# Patient Record
Sex: Female | Born: 1970 | Race: Black or African American | Hispanic: No | Smoking: Never smoker
Health system: Southern US, Community
[De-identification: ages and names within clinical notes are randomized; demographics above are authoritative.]

## PROBLEM LIST (undated history)

## (undated) ENCOUNTER — Emergency Department: Admission: EM | Payer: PRIVATE HEALTH INSURANCE | Source: Home / Self Care

## (undated) DIAGNOSIS — G709 Myoneural disorder, unspecified: Secondary | ICD-10-CM

## (undated) DIAGNOSIS — F419 Anxiety disorder, unspecified: Secondary | ICD-10-CM

## (undated) DIAGNOSIS — G43909 Migraine, unspecified, not intractable, without status migrainosus: Secondary | ICD-10-CM

## (undated) DIAGNOSIS — D649 Anemia, unspecified: Secondary | ICD-10-CM

## (undated) DIAGNOSIS — J439 Emphysema, unspecified: Secondary | ICD-10-CM

## (undated) DIAGNOSIS — T7840XA Allergy, unspecified, initial encounter: Secondary | ICD-10-CM

## (undated) DIAGNOSIS — M1990 Unspecified osteoarthritis, unspecified site: Secondary | ICD-10-CM

## (undated) HISTORY — PX: BREAST SURGERY: SHX581

## (undated) HISTORY — DX: Allergy, unspecified, initial encounter: T78.40XA

## (undated) HISTORY — DX: Anemia, unspecified: D64.9

## (undated) HISTORY — DX: Myoneural disorder, unspecified: G70.9

## (undated) HISTORY — DX: Unspecified osteoarthritis, unspecified site: M19.90

## (undated) HISTORY — DX: Emphysema, unspecified: J43.9

---
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
## 2001-02-22 HISTORY — PX: CYSTOSCOPY: SUR368

## 2005-02-22 HISTORY — PX: LIPOMA EXCISION: SHX5283

## 2011-06-01 DIAGNOSIS — R209 Unspecified disturbances of skin sensation: Secondary | ICD-10-CM | POA: Insufficient documentation

## 2011-06-01 DIAGNOSIS — R42 Dizziness and giddiness: Secondary | ICD-10-CM | POA: Insufficient documentation

## 2012-03-28 DIAGNOSIS — F411 Generalized anxiety disorder: Secondary | ICD-10-CM | POA: Insufficient documentation

## 2012-09-27 DIAGNOSIS — G5700 Lesion of sciatic nerve, unspecified lower limb: Secondary | ICD-10-CM | POA: Insufficient documentation

## 2012-11-29 DIAGNOSIS — H6980 Other specified disorders of Eustachian tube, unspecified ear: Secondary | ICD-10-CM | POA: Insufficient documentation

## 2013-04-04 DIAGNOSIS — K219 Gastro-esophageal reflux disease without esophagitis: Secondary | ICD-10-CM | POA: Insufficient documentation

## 2013-04-27 DIAGNOSIS — K581 Irritable bowel syndrome with constipation: Secondary | ICD-10-CM | POA: Insufficient documentation

## 2013-05-31 DIAGNOSIS — M7630 Iliotibial band syndrome, unspecified leg: Secondary | ICD-10-CM | POA: Insufficient documentation

## 2013-06-23 DIAGNOSIS — Z8639 Personal history of other endocrine, nutritional and metabolic disease: Secondary | ICD-10-CM | POA: Insufficient documentation

## 2013-08-09 DIAGNOSIS — M545 Low back pain, unspecified: Secondary | ICD-10-CM | POA: Insufficient documentation

## 2013-08-09 DIAGNOSIS — M7071 Other bursitis of hip, right hip: Secondary | ICD-10-CM | POA: Insufficient documentation

## 2013-08-09 DIAGNOSIS — M7070 Other bursitis of hip, unspecified hip: Secondary | ICD-10-CM | POA: Insufficient documentation

## 2013-08-10 DIAGNOSIS — N393 Stress incontinence (female) (male): Secondary | ICD-10-CM | POA: Insufficient documentation

## 2013-11-15 DIAGNOSIS — M25869 Other specified joint disorders, unspecified knee: Secondary | ICD-10-CM | POA: Insufficient documentation

## 2013-12-12 DIAGNOSIS — R519 Headache, unspecified: Secondary | ICD-10-CM | POA: Insufficient documentation

## 2014-10-10 DIAGNOSIS — G8929 Other chronic pain: Secondary | ICD-10-CM | POA: Insufficient documentation

## 2015-09-24 DIAGNOSIS — M546 Pain in thoracic spine: Secondary | ICD-10-CM | POA: Insufficient documentation

## 2016-07-14 DIAGNOSIS — F419 Anxiety disorder, unspecified: Secondary | ICD-10-CM | POA: Insufficient documentation

## 2018-06-08 DIAGNOSIS — R768 Other specified abnormal immunological findings in serum: Secondary | ICD-10-CM | POA: Insufficient documentation

## 2018-06-08 DIAGNOSIS — M25549 Pain in joints of unspecified hand: Secondary | ICD-10-CM | POA: Insufficient documentation

## 2018-06-08 DIAGNOSIS — M4802 Spinal stenosis, cervical region: Secondary | ICD-10-CM | POA: Insufficient documentation

## 2019-02-21 DIAGNOSIS — H6593 Unspecified nonsuppurative otitis media, bilateral: Secondary | ICD-10-CM | POA: Insufficient documentation

## 2019-02-21 DIAGNOSIS — N946 Dysmenorrhea, unspecified: Secondary | ICD-10-CM | POA: Insufficient documentation

## 2019-04-12 ENCOUNTER — Ambulatory Visit
Admission: EM | Admit: 2019-04-12 | Discharge: 2019-04-12 | Disposition: A | Discharge status: Home / Self Care | Payer: Managed Care, Other (non HMO) | Attending: Family Medicine | Admitting: Family Medicine

## 2019-04-12 ENCOUNTER — Encounter: Payer: Self-pay | Admitting: Emergency Medicine

## 2019-04-12 ENCOUNTER — Other Ambulatory Visit: Payer: Self-pay

## 2019-04-12 DIAGNOSIS — Z20822 Contact with and (suspected) exposure to covid-19: Secondary | ICD-10-CM

## 2019-04-12 LAB — SARS CORONAVIRUS 2 AG (30 MIN TAT): SARS Coronavirus 2 Ag: NEGATIVE

## 2019-04-12 NOTE — Discharge Instructions (Signed)
Rest.  Fluids.  Tylenol and ibuprofen as needed.  Take care  Dr. Maikayla Beggs  

## 2019-04-12 NOTE — ED Triage Notes (Signed)
Patient states she was exposed to COVID last week by 5 people. She states she has had a low grade fever, body aches, headache and fatigue that started Monday.

## 2019-04-12 NOTE — ED Provider Notes (Signed)
[facility]    CSN: [id number] Arrival date & time: 04/12/19  1755  History   Chief Complaint Chief Complaint  Patient presents with  . Generalized Body Aches  . Fatigue   HPI  49 year old female presents with the above complaints.  Patient reports recent exposure to an individual who tested positive for Covid.  She reports low-grade fever, body aches, headache, fatigue, chills, body aches.  Rates her pain as 5/10 in severity.  No relieving factors.  Her son is also sick with similar symptoms.  No other reported symptoms.  No other complaints or concerns at this time.  Home Medications    Prior to Admission medications   Medication Sig Start Date End Date Taking? Authorizing Provider  amoxicillin (AMOXIL) 500 MG capsule Take 500 mg by mouth 3 (three) times daily.   Yes [provider]  ergocalciferol (VITAMIN D2) 1.25 MG (50000 UT) capsule Take by mouth. 02/22/19 02/22/20 Yes [provider]   Social History Social History   Tobacco Use  . Smoking status: Never Smoker  . Smokeless tobacco: Never Used  Substance Use Topics  . Alcohol use: Never  . Drug use: Never     Allergies   Hydrocodone   Review of Systems Review of Systems Per HPI  Physical Exam Triage Vital Signs ED Triage Vitals  Enc Vitals Group     BP 04/12/19 1814 126/80     Pulse Rate 04/12/19 1814 78     Resp 04/12/19 1814 18     Temp 04/12/19 1814 99.8 F (37.7 C)     Temp Source 04/12/19 1814 Oral     SpO2 04/12/19 1814 98 %     Weight 04/12/19 1810 141 lb (64 kg)     Height 04/12/19 1810 5\' 4"  (1.626 m)     Head Circumference --      Peak Flow --      Pain Score 04/12/19 1809 5     Pain Loc --      Pain Edu? --      Excl. in [place]? --    Updated Vital Signs BP 126/80 (BP Location: Right Arm)   Pulse 78   Temp 99.8 F (37.7 C) (Oral)   Resp 18   Ht 5\' 4"  (1.626 m)   Wt 64 kg   LMP 03/29/2019   SpO2 98%   BMI 24.20 kg/m   Visual Acuity Right Eye  Distance:   Left Eye Distance:   Bilateral Distance:    Right Eye Near:   Left Eye Near:    Bilateral Near:     Physical Exam Vitals and nursing note reviewed.  Constitutional:      General: She is not in acute distress.    Appearance: Normal appearance. She is not ill-appearing.  HENT:     Head: Normocephalic and atraumatic.     Mouth/Throat:     Pharynx: Oropharynx is clear. No oropharyngeal exudate.  Eyes:     General:        Right eye: No discharge.        Left eye: No discharge.     Conjunctiva/sclera: Conjunctivae normal.  Cardiovascular:     Rate and Rhythm: Normal rate and regular rhythm.     Heart sounds: No murmur.  Pulmonary:     Effort: Pulmonary effort is normal.     Breath sounds: Normal breath sounds. No wheezing, rhonchi or rales.  Neurological:     Mental Status: She is alert.  Psychiatric:        Mood and Affect: Mood normal.        Behavior: Behavior normal.    UC Treatments / Results  Labs (all labs ordered are listed, but only abnormal results are displayed) Labs Reviewed  SARS CORONAVIRUS 2 AG (30 MIN TAT)  NOVEL CORONAVIRUS, NAA (HOSP ORDER, SEND-OUT TO REF LAB; TAT 18-24 HRS)    EKG   Radiology No results found.  Procedures Procedures (including critical care time)  Medications Ordered in UC Medications - No data to display  Initial Impression / Assessment and Plan / UC Course  I have reviewed the triage vital signs and the nursing notes.  Pertinent labs & imaging results that were available during my care of the patient were reviewed by me and considered in my medical decision making (see chart for details).    49 year old female presents with suspected COVID-19.  Rapid test negative.  Awaiting PCR test results.  Advised rest, fluids, Tylenol and ibuprofen as needed.  Supportive care.  Work note given.  Final Clinical Impressions(s) / UC Diagnoses   Final diagnoses:  Suspected COVID-19 virus infection     Discharge  Instructions     Rest.  Fluids.  Tylenol and ibuprofen as needed.  Take care  Dr. Adriana Simas    ED Prescriptions    None     PDMP not reviewed this encounter.   Tommie Sams, [place] 04/12/19 [phone]

## 2019-04-14 LAB — NOVEL CORONAVIRUS, NAA (HOSP ORDER, SEND-OUT TO REF LAB; TAT 18-24 HRS): SARS-CoV-2, NAA: NOT DETECTED

## 2019-05-28 ENCOUNTER — Other Ambulatory Visit: Payer: Self-pay

## 2019-05-28 ENCOUNTER — Ambulatory Visit
Admission: EM | Admit: 2019-05-28 | Discharge: 2019-05-28 | Discharge status: Against Medical Advice | Payer: Managed Care, Other (non HMO) | Attending: Family Medicine | Admitting: Family Medicine

## 2019-06-07 DIAGNOSIS — J302 Other seasonal allergic rhinitis: Secondary | ICD-10-CM | POA: Insufficient documentation

## 2019-06-11 DIAGNOSIS — G4710 Hypersomnia, unspecified: Secondary | ICD-10-CM | POA: Insufficient documentation

## 2019-06-14 ENCOUNTER — Other Ambulatory Visit: Payer: Self-pay | Admitting: Neurology

## 2019-06-14 DIAGNOSIS — R404 Transient alteration of awareness: Secondary | ICD-10-CM

## 2019-06-14 DIAGNOSIS — R0683 Snoring: Secondary | ICD-10-CM

## 2020-02-15 ENCOUNTER — Emergency Department (HOSPITAL_COMMUNITY): Payer: Medicaid Other

## 2020-02-15 ENCOUNTER — Other Ambulatory Visit: Payer: Self-pay

## 2020-02-15 ENCOUNTER — Encounter (HOSPITAL_COMMUNITY): Payer: Self-pay | Admitting: Physician Assistant

## 2020-02-15 ENCOUNTER — Emergency Department (HOSPITAL_COMMUNITY)
Admission: EM | Admit: 2020-02-15 | Discharge: 2020-02-15 | Disposition: A | Discharge status: Home / Self Care | Payer: Medicaid Other | Attending: Emergency Medicine | Admitting: Emergency Medicine

## 2020-02-15 DIAGNOSIS — R55 Syncope and collapse: Secondary | ICD-10-CM | POA: Insufficient documentation

## 2020-02-15 LAB — CBC WITH DIFFERENTIAL/PLATELET
Abs Immature Granulocytes: 0.01 10*3/uL (ref 0.00–0.07)
Basophils Absolute: 0.1 10*3/uL (ref 0.0–0.1)
Basophils Relative: 1 %
Eosinophils Absolute: 0.4 10*3/uL (ref 0.0–0.5)
Eosinophils Relative: 7 %
HCT: 34 % — ABNORMAL LOW (ref 36.0–46.0)
Hemoglobin: 10.6 g/dL — ABNORMAL LOW (ref 12.0–15.0)
Immature Granulocytes: 0 %
Lymphocytes Relative: 29 %
Lymphs Abs: 1.9 10*3/uL (ref 0.7–4.0)
MCH: 25 pg — ABNORMAL LOW (ref 26.0–34.0)
MCHC: 31.2 g/dL (ref 30.0–36.0)
MCV: 80.2 fL (ref 80.0–100.0)
Monocytes Absolute: 0.6 10*3/uL (ref 0.1–1.0)
Monocytes Relative: 10 %
Neutro Abs: 3.3 10*3/uL (ref 1.7–7.7)
Neutrophils Relative %: 53 %
Platelets: 314 10*3/uL (ref 150–400)
RBC: 4.24 MIL/uL (ref 3.87–5.11)
RDW: 17.1 % — ABNORMAL HIGH (ref 11.5–15.5)
WBC: 6.3 10*3/uL (ref 4.0–10.5)
nRBC: 0 % (ref 0.0–0.2)

## 2020-02-15 LAB — COMPREHENSIVE METABOLIC PANEL
ALT: 12 U/L (ref 0–44)
AST: 26 U/L (ref 15–41)
Albumin: 3.7 g/dL (ref 3.5–5.0)
Alkaline Phosphatase: 54 U/L (ref 38–126)
Anion gap: 9 (ref 5–15)
BUN: 8 mg/dL (ref 6–20)
CO2: 25 mmol/L (ref 22–32)
Calcium: 9.1 mg/dL (ref 8.9–10.3)
Chloride: 103 mmol/L (ref 98–111)
Creatinine, Ser: 0.63 mg/dL (ref 0.44–1.00)
GFR, Estimated: >60 mL/min (ref >60)
Glucose, Bld: 88 mg/dL (ref 70–99)
Potassium: 3.7 mmol/L (ref 3.5–5.1)
Sodium: 137 mmol/L (ref 135–145)
Total Bilirubin: 0.7 mg/dL (ref 0.3–1.2)
Total Protein: 6.6 g/dL (ref 6.5–8.1)

## 2020-02-15 LAB — I-STAT BETA HCG BLOOD, ED (MC, WL, AP ONLY): I-stat hCG, quantitative: <5 m[IU]/mL (ref <5)

## 2020-02-15 LAB — LIPASE, BLOOD: Lipase: 33 U/L (ref 11–51)

## 2020-02-15 LAB — TROPONIN I (HIGH SENSITIVITY): Troponin I (High Sensitivity): 6 ng/L (ref <18)

## 2020-02-15 MED ORDER — LACTATED RINGERS IV BOLUS
1000.0000 mL | Freq: Once | INTRAVENOUS | Status: AC
  Administered 2020-02-15: 1000 mL via INTRAVENOUS

## 2020-02-15 NOTE — ED Provider Notes (Signed)
[facility] EMERGENCY DEPARTMENT Provider Note   CSN: [id number] Arrival date & time: 02/15/20  1931     History Chief Complaint  Patient presents with   Near Syncope    Eladia Shimkus is a 49 y.o. female who presents today for evaluation of multiple complaints. She is being followed by cardiology at [facility] and neurology at [facility] for about a year of multiple symptoms.  She frequently has episodes of chest pain. She states that she had an episode today that is consistent with her usual episodes.  She states that she feels like her eyes get inflamed to the point that she can feel air moving by them and knows that shortly after that she is going to feel very fatigued.  She had that feeling today along with feeling like air is rushing by the left side of her head.  She then felt like the signals from her brain were not getting to her muscles.  She denies specific weakness, stating that when her son pulled her forward she was able to walk however it was very slow like moving through molasses and she felt very fatigued.  She reports she felt like she may pass out during this however did not.  She reports that she has had occasional cough recently.  She had left-sided chest pain today for about 3 hours.  She has had chest pain intermittently over the past few months along with the syncopal events.  She does report that she had back and neck pain bilaterally starting a few days ago.  She denies any fevers.  She also reports multiple chronic problems including continued pain in her upper abdomen that started after she had an endoscopy.    She states that if she was closer to home she would have simply gone home and laid down tonight.  She reports that she has only had about 8 ounces of water today.  On chart review it appears that when she was previously seen in the emergency room for the symptoms she was also felt to be dehydrated at that point and her cardiologist had recommended  her taking in 64 ounces of water a day.    HPI     History reviewed. No pertinent past medical history.  There are no problems to display for this patient.   History reviewed. No pertinent surgical history.   OB History   No obstetric history on file.     History reviewed. No pertinent family history.  Social History   Tobacco Use   Smoking status: Never Smoker   Smokeless tobacco: Never Used  Substance Use Topics   Alcohol use: Never   Drug use: Never    Home Medications Prior to Admission medications   Medication Sig Start Date End Date Taking? Authorizing Provider  cyanocobalamin 1000 MCG tablet Take 1,000 mcg by mouth daily.   Yes [provider]  ergocalciferol (VITAMIN D2) 1.25 MG (50000 UT) capsule Take 50,000 Units by mouth once a week. 02/22/19 02/22/20 Yes [provider]  LORazepam (ATIVAN) 1 MG tablet Take 1 mg by mouth every 6 (six) hours as needed for anxiety. 05/23/19  Yes [provider]  Prenatal Vit-Fe Fumarate-FA (PRENATAL MULTIVITAMIN) TABS tablet Take 1 tablet by mouth daily.   Yes [provider]    Allergies    Contrast media [iodinated diagnostic agents], Hydrocodone, and Propoxyphene  Review of Systems   Review of Systems  Constitutional: Negative for chills and fever.  HENT: Positive  for congestion.   Eyes: Negative for visual disturbance.  Respiratory: Positive for chest tightness and shortness of breath.   Cardiovascular: Positive for chest pain.  Gastrointestinal: Positive for abdominal pain. Negative for diarrhea, nausea and vomiting.  Musculoskeletal: Positive for back pain and myalgias.  Skin: Positive for color change.  Neurological: Positive for headaches. Negative for speech difficulty and numbness.  All other systems reviewed and are negative.   Physical Exam Updated Vital Signs BP 131/79 (BP Location: Right Arm)    Pulse 77    Temp 99 F (37.2 C) (Oral)    Resp 18    Ht 5\' 4"  (1.626  m)    Wt 63.1 kg    SpO2 100%    BMI 23.88 kg/m   Physical Exam Vitals and nursing note reviewed.  Constitutional:      General: She is not in acute distress.    Appearance: She is not diaphoretic.  HENT:     Head: Normocephalic and atraumatic.  Eyes:     General: No scleral icterus.       Right eye: No discharge.        Left eye: No discharge.     Conjunctiva/sclera: Conjunctivae normal.  Cardiovascular:     Rate and Rhythm: Normal rate and regular rhythm.     Heart sounds: Normal heart sounds.  Pulmonary:     Effort: Pulmonary effort is normal. No respiratory distress.     Breath sounds: No stridor.  Abdominal:     General: There is no distension.  Musculoskeletal:        General: No deformity.     Cervical back: Normal range of motion and neck supple.     Comments: Diffuse TTP across bilateral trapezius muscles which feel tight.  Palpation recreates and exacerbates her pain.   Skin:    General: Skin is warm and dry.  Neurological:     General: No focal deficit present.     Mental Status: She is alert.     Cranial Nerves: No cranial nerve deficit.     Motor: No abnormal muscle tone.  Psychiatric:        Mood and Affect: Mood normal.     ED Results / Procedures / Treatments   Labs (all labs ordered are listed, but only abnormal results are displayed) Labs Reviewed  CBC WITH DIFFERENTIAL/PLATELET - Abnormal; Notable for the following components:      Result Value   Hemoglobin 10.6 (*)    HCT 34.0 (*)    MCH 25.0 (*)    RDW 17.1 (*)    All other components within normal limits  COMPREHENSIVE METABOLIC PANEL  LIPASE, BLOOD  I-STAT BETA HCG BLOOD, ED ([facility], [facility], [facility] ONLY)  TROPONIN I (HIGH SENSITIVITY)  TROPONIN I (HIGH SENSITIVITY)    EKG EKG Interpretation  Date/Time:  Friday February 15 2020 19:45:21 EST Ventricular Rate:  84 PR Interval:    QRS Duration: 88 QT Interval:  361 QTC Calculation: 427 R Axis:   77 Text Interpretation: Sinus rhythm  Anteroseptal infarct, age indeterminate Confirmed by 04[birth year]19[birth year]1992 [phone]) on 02/15/2020 7:47:43 PM   Radiology DG Chest 2 View  Result Date: 02/15/2020 CLINICAL DATA:  Intermittent chest pain with near syncope. EXAM: CHEST - 2 VIEW COMPARISON:  None. FINDINGS: The heart size and mediastinal contours are within normal limits. Both lungs are clear. The visualized skeletal structures are unremarkable. IMPRESSION: No active cardiopulmonary disease. Electronically Signed   By: 02/17/2020.D.  On: 02/15/2020 20:29    Procedures Procedures (including critical care time)  Medications Ordered in ED Medications  lactated ringers bolus 1,000 mL (0 mLs Intravenous Stopped 02/15/20 2200)    ED Course  I have reviewed the triage vital signs and the nursing notes.  Pertinent labs & imaging results that were available during my care of the patient were reviewed by me and considered in my medical decision making (see chart for details).    MDM Rules/Calculators/A&P                         Patient is a 49 year old woman who presents today for evaluation of near syncopal event.  She has had multiple similar events and this has been an ongoing issue for approximately a year.  She is seen by cardiology and neurology without a clear unifying diagnosis obtained. On exam she is in no obvious distress.  She is afebrile, not tachycardic or tachypneic.  Chest x-ray obtained without acute abnormality.  She is not significantly orthostatic.  Troponin is not elevated.  EKG without acute abnormalities.  Labs obtained and reviewed, she is mildly anemic with a hemoglobin at 10.0.  CMP, lipase, hCG and troponin are all unremarkable. She does report that she has had poor water intake today only having about 6 to 8 ounces of water all day.  On review of notes from her specialist it appears that in the past when she has been seen in the ER for similar episodes there was felt to be a dehydration component.  She is  given 1 L of IV fluids.  Patient requested that her cardiac MRI that her cardiologist had ordered be performed in the emergency room.  I had a lengthy discussion with patient that this is not a standard emergency evaluation, and that I recommended she get the test as an outpatient as her cardiologist intended.  Given that the symptoms have been intermittently ongoing for approximately a year lower suspicion for a acute emergent cause of her symptoms.  General conservative care as discussed.  Note: Portions of this report may have been transcribed using voice recognition software. Every effort was made to ensure accuracy; however, inadvertent computerized transcription errors may be present.   Final Clinical Impression(s) / ED Diagnoses Final diagnoses:  Near syncope    Rx / DC Orders ED Discharge Orders    None       Norman Clay 02/15/20 2211    Sabas Sous, MD 02/15/20 2314

## 2020-02-15 NOTE — ED Notes (Signed)
ED Provider at bedside. 

## 2020-02-15 NOTE — ED Notes (Signed)
Patient transported to X-ray 

## 2020-02-15 NOTE — Discharge Instructions (Addendum)
Please make sure you are drinking plenty of water. It appears that your specialist has recommend that you consume 64 ounces of water a day. I would recommend getting a large water bottle that you can keep with you with the number ounces clearly marked to ensure that you are drinking enough water.  Please schedule follow-up appointment with your specialist.  If your symptoms worsen or you have any additional concerns please seek additional medical care and evaluation.

## 2020-02-15 NOTE — ED Triage Notes (Signed)
Brought in by [facility], pt been having past couple of months intermittent chest pain, near syncopal episodes.  Back and neck pain x few days ago. Pt got out of car and felt like she was gonna black out and SOB.    Pt sees a Psychiatric nurse. Pending MRI of heart.

## 2020-03-06 DIAGNOSIS — R002 Palpitations: Secondary | ICD-10-CM | POA: Insufficient documentation

## 2020-05-02 DIAGNOSIS — D509 Iron deficiency anemia, unspecified: Secondary | ICD-10-CM | POA: Insufficient documentation

## 2020-06-19 ENCOUNTER — Ambulatory Visit (INDEPENDENT_AMBULATORY_CARE_PROVIDER_SITE_OTHER): Payer: Self-pay

## 2020-06-19 ENCOUNTER — Other Ambulatory Visit: Payer: Self-pay

## 2020-06-19 ENCOUNTER — Ambulatory Visit
Admission: EM | Admit: 2020-06-19 | Discharge: 2020-06-19 | Disposition: A | Discharge status: Home / Self Care | Payer: Self-pay | Attending: Emergency Medicine | Admitting: Emergency Medicine

## 2020-06-19 ENCOUNTER — Encounter: Payer: Self-pay | Admitting: Emergency Medicine

## 2020-06-19 DIAGNOSIS — R110 Nausea: Secondary | ICD-10-CM | POA: Insufficient documentation

## 2020-06-19 DIAGNOSIS — J029 Acute pharyngitis, unspecified: Secondary | ICD-10-CM | POA: Insufficient documentation

## 2020-06-19 DIAGNOSIS — H9209 Otalgia, unspecified ear: Secondary | ICD-10-CM | POA: Insufficient documentation

## 2020-06-19 DIAGNOSIS — K5900 Constipation, unspecified: Secondary | ICD-10-CM

## 2020-06-19 DIAGNOSIS — R059 Cough, unspecified: Secondary | ICD-10-CM | POA: Insufficient documentation

## 2020-06-19 DIAGNOSIS — Z20822 Contact with and (suspected) exposure to covid-19: Secondary | ICD-10-CM | POA: Insufficient documentation

## 2020-06-19 DIAGNOSIS — R0602 Shortness of breath: Secondary | ICD-10-CM | POA: Insufficient documentation

## 2020-06-19 DIAGNOSIS — R109 Unspecified abdominal pain: Secondary | ICD-10-CM

## 2020-06-19 DIAGNOSIS — J069 Acute upper respiratory infection, unspecified: Secondary | ICD-10-CM

## 2020-06-19 LAB — RAPID INFLUENZA A&B ANTIGENS
Influenza A (ARMC): NEGATIVE
Influenza B (ARMC): NEGATIVE

## 2020-06-19 MED ORDER — PROMETHAZINE-DM 6.25-15 MG/5ML PO SYRP: 5.0000 mL | ORAL_SOLUTION | Freq: Four times a day (QID) | ORAL | 0 refills | Status: DC | PRN

## 2020-06-19 MED ORDER — IPRATROPIUM BROMIDE 0.06 % NA SOLN: 2.0000 | Freq: Four times a day (QID) | NASAL | 12 refills | Status: DC

## 2020-06-19 MED ORDER — BENZONATATE 100 MG PO CAPS: 200.0000 mg | ORAL_CAPSULE | Freq: Three times a day (TID) | ORAL | 0 refills | Status: DC

## 2020-06-19 NOTE — ED Provider Notes (Signed)
[facility]    CSN: [id number] Arrival date & time: 06/19/20  1134      History   Chief Complaint Chief Complaint  Patient presents with  . Shortness of Breath  . Abdominal Pain  . Sore Throat  . Headache    HPI Robin Schwartz is a 50 y.o. female.   HPI   50 year old female here for evaluation of multiple complaints.  Patient reports that she has been experiencing runny nose with a clear nasal discharge, cough, chest congestion productive for clear sputum, shortness of breath, headache, chills, sinus pain and pressure, sore throat, ear pain, nausea, and constipation for the last 2 days.  Patient reports that her last BM was this morning and it was hard balls of stool.  She denies fever at home, decreased hearing, wheezing, or diarrhea.  Patient does have a mildly elevated temp in clinic of 99.1  History reviewed. No pertinent past medical history.  There are no problems to display for this patient.   History reviewed. No pertinent surgical history.  OB History   No obstetric history on file.      Home Medications    Prior to Admission medications   Medication Sig Start Date End Date Taking? Authorizing Provider  benzonatate (TESSALON) 100 MG capsule Take 2 capsules (200 mg total) by mouth every 8 (eight) hours. 06/19/20  Yes Becky Augusta, NP  ipratropium (ATROVENT) 0.06 % nasal spray Place 2 sprays into both nostrils 4 (four) times daily. 06/19/20  Yes Becky Augusta, NP  promethazine-dextromethorphan (PROMETHAZINE-DM) 6.25-15 MG/5ML syrup Take 5 mLs by mouth 4 (four) times daily as needed. 06/19/20  Yes Becky Augusta, NP  cyanocobalamin 1000 MCG tablet Take 1,000 mcg by mouth daily.    [provider]  LORazepam (ATIVAN) 1 MG tablet Take 1 mg by mouth every 6 (six) hours as needed for anxiety. 05/23/19   [provider]    Family History History reviewed. No pertinent family history.  Social History Social History   Tobacco Use  .  Smoking status: Never Smoker  . Smokeless tobacco: Never Used  Substance Use Topics  . Alcohol use: Never  . Drug use: Never     Allergies   Contrast media [iodinated diagnostic agents], Hydrocodone, and Propoxyphene   Review of Systems Review of Systems  Constitutional: Positive for chills. Negative for activity change, appetite change and fever.  HENT: Positive for congestion, ear pain, rhinorrhea, sinus pressure and sore throat. Negative for hearing loss.   Respiratory: Positive for cough and shortness of breath. Negative for wheezing.   Gastrointestinal: Positive for abdominal pain, constipation and nausea. Negative for diarrhea and vomiting.  Skin: Negative for rash.  Neurological: Positive for headaches.  Hematological: Negative.   Psychiatric/Behavioral: Negative.      Physical Exam Triage Vital Signs ED Triage Vitals [06/19/20 1307]  Enc Vitals Group     BP (!) 116/91     Pulse Rate 84     Resp 18     Temp 99.1 F (37.3 C)     Temp Source Oral     SpO2 100 %     Weight      Height      Head Circumference      Peak Flow      Pain Score      Pain Loc      Pain Edu?      Excl. in [place]?    No data found.  Updated Vital Signs BP Marland Kitchen)  116/91 (BP Location: Left Arm)   Pulse 84   Temp 99.1 F (37.3 C) (Oral)   Resp 18   LMP 06/16/2020   SpO2 100%   Visual Acuity Right Eye Distance:   Left Eye Distance:   Bilateral Distance:    Right Eye Near:   Left Eye Near:    Bilateral Near:     Physical Exam Vitals and nursing note reviewed.  Constitutional:      General: She is not in acute distress.    Appearance: Normal appearance. She is normal weight. She is not ill-appearing.  HENT:     Head: Normocephalic and atraumatic.     Right Ear: Tympanic membrane, ear canal and external ear normal. There is no impacted cerumen.     Left Ear: Tympanic membrane and external ear normal. There is no impacted cerumen.     Nose: Congestion and rhinorrhea present.      Mouth/Throat:     Mouth: Mucous membranes are moist.     Pharynx: Oropharynx is clear. Posterior oropharyngeal erythema present. No oropharyngeal exudate.  Cardiovascular:     Rate and Rhythm: Normal rate and regular rhythm.     Pulses: Normal pulses.     Heart sounds: Normal heart sounds. No murmur heard.   Pulmonary:     Effort: Pulmonary effort is normal.     Breath sounds: Normal breath sounds. No wheezing, rhonchi or rales.  Abdominal:     General: Abdomen is flat.     Palpations: Abdomen is soft.     Tenderness: There is abdominal tenderness. There is no guarding or rebound.  Musculoskeletal:     Cervical back: Normal range of motion and neck supple.  Lymphadenopathy:     Cervical: No cervical adenopathy.  Skin:    General: Skin is warm and dry.     Capillary Refill: Capillary refill takes less than 2 seconds.     Findings: No erythema or rash.  Neurological:     General: No focal deficit present.     Mental Status: She is alert and oriented to person, place, and time.  Psychiatric:        Mood and Affect: Mood normal.        Behavior: Behavior normal.        Thought Content: Thought content normal.        Judgment: Judgment normal.      UC Treatments / Results  Labs (all labs ordered are listed, but only abnormal results are displayed) Labs Reviewed  RAPID INFLUENZA A&B ANTIGENS  SARS CORONAVIRUS 2 (TAT 6-24 HRS)    EKG   Radiology No results found.  Procedures Procedures (including critical care time)  Medications Ordered in UC Medications - No data to display  Initial Impression / Assessment and Plan / UC Course  I have reviewed the triage vital signs and the nursing notes.  Pertinent labs & imaging results that were available during my care of the patient were reviewed by me and considered in my medical decision making (see chart for details).   Patient is a very pleasant, nontoxic-appearing 50 year old female here for evaluation of upper and  lower respiratory complaints and abdominal pain that been going for the past couple of days.  Patient reports that she has been experiencing cough, nasal discharge, chest congestion, shortness of breath, upper abdominal pain, headache, sinus pain or pressure, scratchy throat, productive cough for clear sputum, nausea, constipation, ear pain and chills.  Patient's physical exam reveals bilateral pearly  gray tympanic membranes with normal light reflex and clear external auditory canals.  Nasal mucosa is erythematous and edematous with clear nasal discharge.  Posterior oropharynx has clear postnasal drip and mild posterior erythema.  No tonsillar involvement noted.  Patient does not have any cervical lymphadenopathy on exam.  Lungs are clear auscultation all fields.  Abdomen is soft, nondistended, with diffuse tenderness.  Bowel sounds are within normal limits.  Will swab patient for flu, COVID, and obtain a KUB to evaluate for possible constipation.  KUB reviewed and independently evaluated by me.  Interpretation: There is a moderate stool burden throughout the colon.  There is also number of phleboliths present in the left pelvis.  Awaiting radiology overread.  Once a swab is negative for insulins A or B.  Will discharge patient home to isolate pending the results of her COVID test.  Will provide ipratropium nasal spray to help her with her nasal congestion, Tessalon Perles and Promethazine DM as needed for cough, and have her use MiraLAX for her constipation.   Final Clinical Impressions(s) / UC Diagnoses   Final diagnoses:  Constipation, unspecified constipation type  Viral URI with cough     Discharge Instructions     Testing did not reveal the presence of influenza today but your COVID swab is still pending.  Isolate at home pending the results of your COVID test.  If you test positive then you will have to quarantine for 5 days from the start of your symptoms.  After 5 days you can break  quarantine if your symptoms have improved and you have not had a fever for 24 hours without taking Tylenol or ibuprofen.  Use over-the-counter Tylenol and ibuprofen as needed for body aches and fever.  Use the Tessalon Perles during the day as needed for cough and the Promethazine DM cough syrup at nighttime as will make you drowsy.  Use the ipratropium nasal spray, 2 squirts up each nostril 4 times a day as needed for postnasal drip and runny nose.  If you develop any increased shortness of breath-especially at rest, you are unable to speak in full sentences, or is a late sign your lips are turning blue you need to go the ER for evaluation.   For your constipation take over-the-counter MiraLAX, 1 capful in 8 ounces of beverage of your choice daily until you achieve regular bowel movements.    ED Prescriptions    Medication Sig Dispense Auth. Provider   ipratropium (ATROVENT) 0.06 % nasal spray Place 2 sprays into both nostrils 4 (four) times daily. 15 mL Becky Augusta, NP   benzonatate (TESSALON) 100 MG capsule Take 2 capsules (200 mg total) by mouth every 8 (eight) hours. 21 capsule Becky Augusta, NP   promethazine-dextromethorphan (PROMETHAZINE-DM) 6.25-15 MG/5ML syrup Take 5 mLs by mouth 4 (four) times daily as needed. 118 mL Becky Augusta, NP     PDMP not reviewed this encounter.   Becky Augusta, NP 06/19/20 1425

## 2020-06-19 NOTE — ED Triage Notes (Signed)
Pt is present today with SOB, chest congestion, nasal drainage, upper abdominal pain, and HA.Pt states that her sx started a couple days ago

## 2020-06-19 NOTE — Discharge Instructions (Addendum)
Testing did not reveal the presence of influenza today but your COVID swab is still pending.  Isolate at home pending the results of your COVID test.  If you test positive then you will have to quarantine for 5 days from the start of your symptoms.  After 5 days you can break quarantine if your symptoms have improved and you have not had a fever for 24 hours without taking Tylenol or ibuprofen.  Use over-the-counter Tylenol and ibuprofen as needed for body aches and fever.  Use the Tessalon Perles during the day as needed for cough and the Promethazine DM cough syrup at nighttime as will make you drowsy.  Use the ipratropium nasal spray, 2 squirts up each nostril 4 times a day as needed for postnasal drip and runny nose.  If you develop any increased shortness of breath-especially at rest, you are unable to speak in full sentences, or is a late sign your lips are turning blue you need to go the ER for evaluation.   For your constipation take over-the-counter MiraLAX, 1 capful in 8 ounces of beverage of your choice daily until you achieve regular bowel movements.

## 2020-06-20 LAB — SARS CORONAVIRUS 2 (TAT 6-24 HRS): SARS Coronavirus 2: NEGATIVE

## 2020-07-27 ENCOUNTER — Other Ambulatory Visit: Payer: Self-pay

## 2020-07-27 ENCOUNTER — Encounter: Payer: Self-pay | Admitting: Emergency Medicine

## 2020-07-27 ENCOUNTER — Ambulatory Visit
Admission: EM | Admit: 2020-07-27 | Discharge: 2020-07-27 | Disposition: A | Discharge status: Home / Self Care | Payer: PRIVATE HEALTH INSURANCE | Attending: Family Medicine | Admitting: Family Medicine

## 2020-07-27 DIAGNOSIS — B349 Viral infection, unspecified: Secondary | ICD-10-CM | POA: Diagnosis not present

## 2020-07-27 DIAGNOSIS — U071 COVID-19: Secondary | ICD-10-CM | POA: Diagnosis not present

## 2020-07-27 LAB — INFLUENZA A AND B ANTIGEN (CONVERTED LAB)
INFLUENZA A ANTIGEN, POC: NEGATIVE
INFLUENZA B ANTIGEN, POC: NEGATIVE

## 2020-07-27 MED ORDER — KETOROLAC TROMETHAMINE 10 MG PO TABS: 10.0000 mg | ORAL_TABLET | Freq: Four times a day (QID) | ORAL | 0 refills | Status: DC | PRN

## 2020-07-27 NOTE — Discharge Instructions (Signed)
Flu negative.  COVID test will be back tomorrow.  Medication as prescribed.  Take care  Dr. Adriana Simas

## 2020-07-27 NOTE — ED Triage Notes (Signed)
Pt c/o lethargy. Muscle pain and body aches, chills, and low grade fever onset 2 days ago. Pt c/o chest congestion, cough and runny nose. Pt states she was around a large group of people unmasked and symptoms began the next day.

## 2020-07-28 LAB — SARS CORONAVIRUS 2 (TAT 6-24 HRS): SARS Coronavirus 2: POSITIVE — AB

## 2020-07-28 NOTE — ED Provider Notes (Signed)
[facility]    CSN: [id number] Arrival date & time: 07/27/20  1522      History   Chief Complaint Chief Complaint  Patient presents with  . covid symptoms   HPI  50 year old female presents with the above complaint.  2 day history of symptoms. Reports body aches, chills, low grade fever. Also reports cough. Has recently been around a large group of people. Concern for flu. No relieving factors. No known exacerbating factors.   Home Medications    Prior to Admission medications   Medication Sig Start Date End Date Taking? Authorizing Provider  ketorolac (TORADOL) 10 MG tablet Take 1 tablet (10 mg total) by mouth every 6 (six) hours as needed for moderate pain or severe pain. 07/27/20  Yes Fatoumata Albaugh G, DO  benzonatate (TESSALON) 100 MG capsule Take 2 capsules (200 mg total) by mouth every 8 (eight) hours. 06/19/20   Becky Augusta, NP  cyanocobalamin 1000 MCG tablet Take 1,000 mcg by mouth daily.    [provider]  ipratropium (ATROVENT) 0.06 % nasal spray Place 2 sprays into both nostrils 4 (four) times daily. 06/19/20   Becky Augusta, NP  LORazepam (ATIVAN) 1 MG tablet Take 1 mg by mouth every 6 (six) hours as needed for anxiety. 05/23/19   [provider]  promethazine-dextromethorphan (PROMETHAZINE-DM) 6.25-15 MG/5ML syrup Take 5 mLs by mouth 4 (four) times daily as needed. 06/19/20   Becky Augusta, NP    Family History History reviewed. No pertinent family history.  Social History Social History   Tobacco Use  . Smoking status: Never Smoker  . Smokeless tobacco: Never Used  Vaping Use  . Vaping Use: Never used  Substance Use Topics  . Alcohol use: Never  . Drug use: Never     Allergies   Contrast media [iodinated diagnostic agents], Hydrocodone, and Propoxyphene   Review of Systems Review of Systems  Constitutional: Positive for chills.  Respiratory: Positive for cough.   Musculoskeletal:       Body aches.    Physical Exam Triage  Vital Signs ED Triage Vitals  Enc Vitals Group     BP 07/27/20 1530 112/69     Pulse Rate 07/27/20 1530 81     Resp 07/27/20 1530 18     Temp 07/27/20 1530 99.5 F (37.5 C)     Temp Source 07/27/20 1530 Oral     SpO2 07/27/20 1530 98 %     Weight --      Height --      Head Circumference --      Peak Flow --      Pain Score 07/27/20 1532 8     Pain Loc --      Pain Edu? --      Excl. in GC? --    Updated Vital Signs BP 112/69 (BP Location: Left Arm)   Pulse 81   Temp 99.5 F (37.5 C) (Oral)   Resp 18   LMP 07/08/2020   SpO2 98%   Visual Acuity Right Eye Distance:   Left Eye Distance:   Bilateral Distance:    Right Eye Near:   Left Eye Near:    Bilateral Near:     Physical Exam Vitals and nursing note reviewed.  Constitutional:      General: She is not in acute distress.    Appearance: Normal appearance. She is not ill-appearing.  HENT:     Head: Normocephalic and atraumatic.  Eyes:  General:        Right eye: No discharge.        Left eye: No discharge.     Conjunctiva/sclera: Conjunctivae normal.  Cardiovascular:     Rate and Rhythm: Normal rate and regular rhythm.  Pulmonary:     Effort: Pulmonary effort is normal.     Breath sounds: Normal breath sounds. No wheezing or rales.  Neurological:     Mental Status: She is alert.  Psychiatric:        Mood and Affect: Mood normal.        Behavior: Behavior normal.    UC Treatments / Results  Labs (all labs ordered are listed, but only abnormal results are displayed) Labs Reviewed  SARS CORONAVIRUS 2 (TAT 6-24 HRS)  INFLUENZA A AND B ANTIGEN (CONVERTED LAB)  POC INFLUENZA A AND B ANTIGEN (URGENT CARE ONLY)    EKG   Radiology No results found.  Procedures Procedures (including critical care time)  Medications Ordered in UC Medications - No data to display  Initial Impression / Assessment and Plan / UC Course  I have reviewed the triage vital signs and the nursing notes.  Pertinent labs  & imaging results that were available during my care of the patient were reviewed by me and considered in my medical decision making (see chart for details).    50 year old female presents with a viral illness. Flu negative. Awaiting COVID test results. Toradol for body aches. Supportive care.   Final Clinical Impressions(s) / UC Diagnoses   Final diagnoses:  Viral illness     Discharge Instructions     Flu negative.  COVID test will be back tomorrow.  Medication as prescribed.  Take care  Dr. Adriana Simas    ED Prescriptions    Medication Sig Dispense Auth. Provider   ketorolac (TORADOL) 10 MG tablet Take 1 tablet (10 mg total) by mouth every 6 (six) hours as needed for moderate pain or severe pain. 20 tablet Tommie Sams, DO     PDMP not reviewed this encounter.   Tommie Sams, [place] 07/28/20 1219

## 2020-07-30 ENCOUNTER — Telehealth (HOSPITAL_COMMUNITY): Payer: Self-pay | Admitting: Emergency Medicine

## 2020-07-30 NOTE — Telephone Encounter (Signed)
Patient continues to c/o nausea after zofran prescription from ER, states she is just still feeling bad and unable to eat.  Encouraged her to try to drink fluids.  Per Dr. Adriana Simas, patient to follow-up in ER if worsening.

## 2020-08-01 DIAGNOSIS — E860 Dehydration: Secondary | ICD-10-CM | POA: Insufficient documentation

## 2020-08-04 ENCOUNTER — Ambulatory Visit
Admission: EM | Admit: 2020-08-04 | Discharge: 2020-08-04 | Disposition: A | Discharge status: Home / Self Care | Payer: PRIVATE HEALTH INSURANCE

## 2020-08-04 ENCOUNTER — Encounter: Payer: Self-pay | Admitting: Emergency Medicine

## 2020-08-04 ENCOUNTER — Other Ambulatory Visit: Payer: Self-pay

## 2020-08-04 DIAGNOSIS — U099 Post covid-19 condition, unspecified: Secondary | ICD-10-CM | POA: Diagnosis not present

## 2020-08-04 HISTORY — DX: Anxiety disorder, unspecified: F41.9

## 2020-08-04 NOTE — ED Triage Notes (Signed)
Pt presents today with c/o of SOB, cough, disorientation and headache x 1 wk. She reports testing positive here last week. She also reports being on her menstrual cycle for 7 days with heavy bleeding.

## 2020-08-04 NOTE — ED Provider Notes (Signed)
[facility]    CSN: [id number] Arrival date & time: 08/04/20  1851      History   Chief Complaint Chief Complaint  Patient presents with   Rash   Shortness of Breath   Cough    HPI Robin Schwartz is a 50 y.o. female.   HPI  50 year old female here for evaluation of multiple complaints.  Patient reports that she has been experiencing continuing the cough and shortness of breath after being diagnosed with COVID 8 days ago.  She is also had some intermittent dizziness, itchy skin, tightness in the back of her head and feeling jittery.  She also been experiencing brain fog.  She denies any rashes.  Patient is concerned because she has been on her menstrual cycle for the last 7 days and typically she runs 3 days and it is over.  She has emailed her OB/GYN but has not heard back.  Patient has a history of iron deficiency anemia but she has stopped taking her iron supplementation as she was supposed to have an iron infusion which she decided not to get because she was uncomfortable with it but did not resume iron supplements.  Past Medical History:  Diagnosis Date   Anxiety     There are no problems to display for this patient.   History reviewed. No pertinent surgical history.  OB History   No obstetric history on file.      Home Medications    Prior to Admission medications   Medication Sig Start Date End Date Taking? Authorizing Provider  LORazepam (ATIVAN) 1 MG tablet Take 1 mg by mouth every 6 (six) hours as needed for anxiety. 05/23/19  Yes [provider]  benzonatate (TESSALON) 100 MG capsule Take 2 capsules (200 mg total) by mouth every 8 (eight) hours. 06/19/20   Becky Augusta, NP  cyanocobalamin 1000 MCG tablet Take 1,000 mcg by mouth daily.    [provider]  ipratropium (ATROVENT) 0.06 % nasal spray Place 2 sprays into both nostrils 4 (four) times daily. 06/19/20   Becky Augusta, NP  ketorolac (TORADOL) 10 MG tablet Take 1 tablet  (10 mg total) by mouth every 6 (six) hours as needed for moderate pain or severe pain. 07/27/20   Tommie Sams, DO  promethazine-dextromethorphan (PROMETHAZINE-DM) 6.25-15 MG/5ML syrup Take 5 mLs by mouth 4 (four) times daily as needed. 06/19/20   Becky Augusta, NP    Family History History reviewed. No pertinent family history.  Social History Social History   Tobacco Use   Smoking status: Never   Smokeless tobacco: Never  Vaping Use   Vaping Use: Never used  Substance Use Topics   Alcohol use: Never   Drug use: Never     Allergies   Contrast media [iodinated diagnostic agents], Hydrocodone, and Propoxyphene   Review of Systems Review of Systems  Constitutional:  Positive for fatigue. Negative for activity change, appetite change and fever.  Respiratory:  Positive for cough and shortness of breath.   Skin:  Negative for rash.  Neurological:  Positive for dizziness.  Psychiatric/Behavioral:  Positive for decreased concentration. The patient is nervous/anxious.     Physical Exam Triage Vital Signs ED Triage Vitals  Enc Vitals Group     BP 08/04/20 1922 118/78     Pulse Rate 08/04/20 1922 (!) 106     Resp 08/04/20 1922 18     Temp 08/04/20 1922 99.2 F (37.3 C)     Temp Source 08/04/20 1922 Oral  SpO2 08/04/20 1922 100 %     Weight --      Height --      Head Circumference --      Peak Flow --      Pain Score 08/04/20 1925 5     Pain Loc --      Pain Edu? --      Excl. in GC? --    No data found.  Updated Vital Signs BP 118/78 (BP Location: Right Arm)   Pulse (!) 106   Temp 99.2 F (37.3 C) (Oral)   Resp 18   LMP 07/28/2020 (Exact Date)   SpO2 100%   Visual Acuity Right Eye Distance:   Left Eye Distance:   Bilateral Distance:    Right Eye Near:   Left Eye Near:    Bilateral Near:     Physical Exam   UC Treatments / Results  Labs (all labs ordered are listed, but only abnormal results are displayed) Labs Reviewed - No data to  display  EKG   Radiology No results found.  Procedures Procedures (including critical care time)  Medications Ordered in UC Medications - No data to display  Initial Impression / Assessment and Plan / UC Course  I have reviewed the triage vital signs and the nursing notes.  Pertinent labs & imaging results that were available during my care of the patient were reviewed by me and considered in my medical decision making (see chart for details).  Patient is a pleasant though ill-appearing 50 year old female here for evaluation of complaints as outlined above in the HPI.  Patient's physical exam reveals a benign upper respiratory exam.  Conjunctiva are pale as are oral mucous membranes though they are moist.  Cardiopulmonary exam is benign.  I discussed with the patient that her symptoms of continued fatigue, cough, shortness of breath can be a result of her recent COVID infection as she is only 8 days postdiagnosis.  I informed her that she she may have symptoms for 6 weeks or more.  I also think that some of her dizziness, skin itching, and tingling in the back of her head is coming from her iron deficiency anemia which is being compounded by her prolonged menstrual cycle.  I advised her to resume her iron supplementation and to contact her GYN for evaluation.  I advised patient that we cannot check iron studies or CBC in the urgent care tonight as her lab closes at 5.  She was also informed that she can get this done as an outpatient lab through her OB/GYN.  Patient inquiring about other things she can do to help improve her symptoms and I advised her to ensure she is getting plenty of rest, staying hydrated, eating clean, continue to take her vitamin C, which will help with her iron absorption, vitamin D help with immune function, zinc, and quercetin to help with her zinc absorption.  Patient verbalized an understanding of same.   Final Clinical Impressions(s) / UC Diagnoses   Final  diagnoses:  Post-COVID syndrome     Discharge Instructions      Resume your iron supplementation.  Contact your OB/GYN regarding your prolonged menstruation for an examination and repeat blood work.  Continue your immune boosting supplements of vitamin D, vitamin C, and zinc.     ED Prescriptions   None    PDMP not reviewed this encounter.   Becky Augusta, NP 08/04/20 2007

## 2020-08-04 NOTE — Discharge Instructions (Addendum)
Resume your iron supplementation.  Contact your OB/GYN regarding your prolonged menstruation for an examination and repeat blood work.  Continue your immune boosting supplements of vitamin D, vitamin C, and zinc.

## 2020-08-13 ENCOUNTER — Inpatient Hospital Stay: Payer: PRIVATE HEALTH INSURANCE

## 2020-08-13 ENCOUNTER — Inpatient Hospital Stay: Payer: PRIVATE HEALTH INSURANCE | Attending: Oncology | Admitting: Oncology

## 2020-08-13 ENCOUNTER — Other Ambulatory Visit: Payer: Self-pay

## 2020-08-13 ENCOUNTER — Encounter: Payer: Self-pay | Admitting: Oncology

## 2020-08-13 VITALS — BP 125/76 | HR 81 | Temp 99.3°F | Resp 16 | Ht 64.6 in | Wt 127.4 lb

## 2020-08-13 DIAGNOSIS — R0602 Shortness of breath: Secondary | ICD-10-CM | POA: Insufficient documentation

## 2020-08-13 DIAGNOSIS — Z8249 Family history of ischemic heart disease and other diseases of the circulatory system: Secondary | ICD-10-CM | POA: Diagnosis not present

## 2020-08-13 DIAGNOSIS — Z8639 Personal history of other endocrine, nutritional and metabolic disease: Secondary | ICD-10-CM

## 2020-08-13 DIAGNOSIS — Z79899 Other long term (current) drug therapy: Secondary | ICD-10-CM | POA: Insufficient documentation

## 2020-08-13 DIAGNOSIS — D509 Iron deficiency anemia, unspecified: Secondary | ICD-10-CM | POA: Insufficient documentation

## 2020-08-13 DIAGNOSIS — Z833 Family history of diabetes mellitus: Secondary | ICD-10-CM | POA: Insufficient documentation

## 2020-08-13 DIAGNOSIS — R42 Dizziness and giddiness: Secondary | ICD-10-CM | POA: Insufficient documentation

## 2020-08-13 DIAGNOSIS — Z800 Family history of malignant neoplasm of digestive organs: Secondary | ICD-10-CM | POA: Insufficient documentation

## 2020-08-13 DIAGNOSIS — R5383 Other fatigue: Secondary | ICD-10-CM | POA: Insufficient documentation

## 2020-08-13 DIAGNOSIS — R002 Palpitations: Secondary | ICD-10-CM | POA: Diagnosis not present

## 2020-08-13 DIAGNOSIS — Z836 Family history of other diseases of the respiratory system: Secondary | ICD-10-CM | POA: Diagnosis not present

## 2020-08-13 DIAGNOSIS — R682 Dry mouth, unspecified: Secondary | ICD-10-CM | POA: Diagnosis not present

## 2020-08-13 DIAGNOSIS — Z808 Family history of malignant neoplasm of other organs or systems: Secondary | ICD-10-CM | POA: Insufficient documentation

## 2020-08-13 LAB — CBC WITH DIFFERENTIAL/PLATELET
Abs Immature Granulocytes: 0.02 10*3/uL (ref 0.00–0.07)
Basophils Absolute: 0.1 10*3/uL (ref 0.0–0.1)
Basophils Relative: 1 %
Eosinophils Absolute: 0.3 10*3/uL (ref 0.0–0.5)
Eosinophils Relative: 5 %
HCT: 31.3 % — ABNORMAL LOW (ref 36.0–46.0)
Hemoglobin: 9.8 g/dL — ABNORMAL LOW (ref 12.0–15.0)
Immature Granulocytes: 0 %
Lymphocytes Relative: 42 %
Lymphs Abs: 2.1 10*3/uL (ref 0.7–4.0)
MCH: 24.4 pg — ABNORMAL LOW (ref 26.0–34.0)
MCHC: 31.3 g/dL (ref 30.0–36.0)
MCV: 77.9 fL — ABNORMAL LOW (ref 80.0–100.0)
Monocytes Absolute: 0.6 10*3/uL (ref 0.1–1.0)
Monocytes Relative: 11 %
Neutro Abs: 2.1 10*3/uL (ref 1.7–7.7)
Neutrophils Relative %: 41 %
Platelets: 427 10*3/uL — ABNORMAL HIGH (ref 150–400)
RBC: 4.02 MIL/uL (ref 3.87–5.11)
RDW: 16.4 % — ABNORMAL HIGH (ref 11.5–15.5)
WBC: 5.1 10*3/uL (ref 4.0–10.5)
nRBC: 0 % (ref 0.0–0.2)

## 2020-08-13 LAB — IRON AND TIBC
Iron: 161 ug/dL (ref 28–170)
Saturation Ratios: 43 % — ABNORMAL HIGH (ref 10.4–31.8)
TIBC: 375 ug/dL (ref 250–450)
UIBC: 214 ug/dL

## 2020-08-13 LAB — RETICULOCYTES
Immature Retic Fract: 21.4 % — ABNORMAL HIGH (ref 2.3–15.9)
RBC.: 4.09 MIL/uL (ref 3.87–5.11)
Retic Count, Absolute: 67.1 10*3/uL (ref 19.0–186.0)
Retic Ct Pct: 1.6 % (ref 0.4–3.1)

## 2020-08-13 LAB — FERRITIN: Ferritin: 23 ng/mL (ref 11–307)

## 2020-08-13 LAB — TSH: TSH: 1.881 u[IU]/mL (ref 0.350–4.500)

## 2020-08-13 LAB — LACTATE DEHYDROGENASE: LDH: 139 U/L (ref 98–192)

## 2020-08-13 LAB — COMPREHENSIVE METABOLIC PANEL
ALT: 13 U/L (ref 0–44)
AST: 24 U/L (ref 15–41)
Albumin: 4.1 g/dL (ref 3.5–5.0)
Alkaline Phosphatase: 36 U/L — ABNORMAL LOW (ref 38–126)
Anion gap: 6 (ref 5–15)
BUN: 6 mg/dL (ref 6–20)
CO2: 28 mmol/L (ref 22–32)
Calcium: 9.2 mg/dL (ref 8.9–10.3)
Chloride: 104 mmol/L (ref 98–111)
Creatinine, Ser: 0.61 mg/dL (ref 0.44–1.00)
GFR, Estimated: >60 mL/min (ref >60)
Glucose, Bld: 107 mg/dL — ABNORMAL HIGH (ref 70–99)
Potassium: 3.6 mmol/L (ref 3.5–5.1)
Sodium: 138 mmol/L (ref 135–145)
Total Bilirubin: 0.5 mg/dL (ref 0.3–1.2)
Total Protein: 7.5 g/dL (ref 6.5–8.1)

## 2020-08-13 LAB — FOLATE: Folate: 14.3 ng/mL (ref >5.9)

## 2020-08-13 LAB — VITAMIN B12: Vitamin B-12: 484 pg/mL (ref 180–914)

## 2020-08-13 LAB — SEDIMENTATION RATE: Sed Rate: 10 mm/hr (ref 0–20)

## 2020-08-13 NOTE — Progress Notes (Signed)
Pt is new anemia- she says that she had covid 2 weeks ago, she has fatigue and it is worse on menstrual  cycles, light headed at times, sob on exertion at sometimes sitting, feels heart palpitations at times, dry mouth, tingling of hands, feet, wrist, and face at times, feels like her eyes strains at times. Has jitter at times. She feels that sometimes she feels that her legs do not get circulation like it should

## 2020-08-14 ENCOUNTER — Emergency Department
Admission: EM | Admit: 2020-08-14 | Discharge: 2020-08-14 | Disposition: A | Discharge status: Home / Self Care | Payer: PRIVATE HEALTH INSURANCE | Attending: Emergency Medicine | Admitting: Emergency Medicine

## 2020-08-14 ENCOUNTER — Emergency Department: Payer: PRIVATE HEALTH INSURANCE

## 2020-08-14 ENCOUNTER — Encounter: Payer: Self-pay | Admitting: Oncology

## 2020-08-14 DIAGNOSIS — F419 Anxiety disorder, unspecified: Secondary | ICD-10-CM

## 2020-08-14 DIAGNOSIS — R42 Dizziness and giddiness: Secondary | ICD-10-CM

## 2020-08-14 DIAGNOSIS — Z8616 Personal history of COVID-19: Secondary | ICD-10-CM | POA: Insufficient documentation

## 2020-08-14 DIAGNOSIS — R9389 Abnormal findings on diagnostic imaging of other specified body structures: Secondary | ICD-10-CM | POA: Insufficient documentation

## 2020-08-14 LAB — COMPREHENSIVE METABOLIC PANEL
ALT: 11 U/L (ref 0–44)
AST: 24 U/L (ref 15–41)
Albumin: 3.9 g/dL (ref 3.5–5.0)
Alkaline Phosphatase: 32 U/L — ABNORMAL LOW (ref 38–126)
Anion gap: 11 (ref 5–15)
BUN: 5 mg/dL — ABNORMAL LOW (ref 6–20)
CO2: 23 mmol/L (ref 22–32)
Calcium: 9 mg/dL (ref 8.9–10.3)
Chloride: 105 mmol/L (ref 98–111)
Creatinine, Ser: 0.65 mg/dL (ref 0.44–1.00)
GFR, Estimated: >60 mL/min (ref >60)
Glucose, Bld: 99 mg/dL (ref 70–99)
Potassium: 3.3 mmol/L — ABNORMAL LOW (ref 3.5–5.1)
Sodium: 139 mmol/L (ref 135–145)
Total Bilirubin: 0.7 mg/dL (ref 0.3–1.2)
Total Protein: 7 g/dL (ref 6.5–8.1)

## 2020-08-14 LAB — CBC
HCT: 30.4 % — ABNORMAL LOW (ref 36.0–46.0)
Hemoglobin: 9.6 g/dL — ABNORMAL LOW (ref 12.0–15.0)
MCH: 24.4 pg — ABNORMAL LOW (ref 26.0–34.0)
MCHC: 31.6 g/dL (ref 30.0–36.0)
MCV: 77.4 fL — ABNORMAL LOW (ref 80.0–100.0)
Platelets: 385 10*3/uL (ref 150–400)
RBC: 3.93 MIL/uL (ref 3.87–5.11)
RDW: 16.5 % — ABNORMAL HIGH (ref 11.5–15.5)
WBC: 6 10*3/uL (ref 4.0–10.5)
nRBC: 0 % (ref 0.0–0.2)

## 2020-08-14 LAB — TROPONIN I (HIGH SENSITIVITY): Troponin I (High Sensitivity): 10 ng/L (ref <18)

## 2020-08-14 MED ORDER — SODIUM CHLORIDE 0.9 % IV BOLUS
1000.0000 mL | Freq: Once | INTRAVENOUS | Status: AC
  Administered 2020-08-14: 1000 mL via INTRAVENOUS

## 2020-08-14 NOTE — ED Triage Notes (Signed)
Pt states she was sitting outside and flet like she was going to pass out , went upstairs and got weak the patient has a PMH  of anxiety  and she took 1mg  of ativan with no change . Pt appears stable , vitals wnl . ABCS appear to be intact

## 2020-08-14 NOTE — ED Provider Notes (Signed)
[facility] Emergency Department Provider Note  Time seen: 7:56 PM  I have reviewed the triage vital signs and the nursing notes.   HISTORY  Chief Complaint Near Syncope   HPI Robin Schwartz is a 50 y.o. female with a past medical history of anxiety who presents to the emergency department for dizziness.  According to the patient she was going outside when she began feeling dizzy which she describes more as a lightheaded sensation like she was going to pass out.  Patient states she went inside to rest but felt very weak and felt like her heart was beating.  Patient states a history of anxiety, took Ativan but states there is no affect so after 20 to 30 minutes she called EMS.  Patient denies any chest pain at any point.  Denies any shortness of breath.  States she is recovering from COVID which was diagnosed less than 2 weeks ago per patient.  Denies any chest pain or pleuritic pain.  Currently patient appears well with reassuring vitals.   Past Medical History:  Diagnosis Date   Anxiety     There are no problems to display for this patient.   No past surgical history on file.  Prior to Admission medications   Medication Sig Start Date End Date Taking? Authorizing Provider  Ascorbic Acid (VITAMIN C) 1000 MG tablet Take 1,000 mg by mouth daily.    [provider]  cyanocobalamin 1000 MCG tablet Take 1,000 mcg by mouth daily. Patient not taking: Reported on 08/13/2020    [provider]  Iron-Vitamin C (VITRON-C) 65-125 MG TABS Take 1 tablet by mouth daily.    [provider]  LORazepam (ATIVAN) 1 MG tablet Take 1 mg by mouth every 6 (six) hours as needed for anxiety. 05/23/19   [provider]    Allergies  Allergen Reactions   Contrast Media [Iodinated Diagnostic Agents]    Hydrocodone    Propoxyphene Other (See Comments) and Rash    Other reaction(s): Hallucination Makes her feel delusional Causes delusions Other  reaction(s): Hallucination Makes her feel delusional Causes delusions Other reaction(s): Hallucination Makes her feel delusional     Family History  Problem Relation Age of Onset   COPD Maternal Aunt    Hypertension Maternal Uncle    Asthma Maternal Uncle    COPD Maternal Uncle    Diabetes Maternal Grandmother    Asthma Maternal Grandmother    Cancer Maternal Grandfather    Colon cancer Maternal Grandfather     Social History Social History   Tobacco Use   Smoking status: Never   Smokeless tobacco: Never  Vaping Use   Vaping Use: Never used  Substance Use Topics   Alcohol use: Never   Drug use: Never    Review of Systems Constitutional: Negative for fever.  Positive for dizziness, largely resolved Cardiovascular: Negative for chest pain. Respiratory: Negative for shortness of breath. Gastrointestinal: Negative for abdominal pain, vomiting and diarrhea. Genitourinary: Negative for urinary compaints Musculoskeletal: Negative for musculoskeletal complaints Skin: Negative for skin complaints  Neurological: Negative for headache All other ROS negative  ____________________________________________   PHYSICAL EXAM:  VITAL SIGNS: ED Triage Vitals  Enc Vitals Group     BP      Pulse      Resp      Temp      Temp src      SpO2      Weight      Height  Head Circumference      Peak Flow      Pain Score      Pain Loc      Pain Edu?      Excl. in GC?    Constitutional: Alert and oriented. Well appearing and in no distress. Eyes: Normal exam ENT      Head: Normocephalic and atraumatic.      Mouth/Throat: Mucous membranes are moist. Cardiovascular: Normal rate, regular rhythm.  Respiratory: Normal respiratory effort without tachypnea nor retractions. Breath sounds are clear Gastrointestinal: Soft and nontender. No distention.  T Musculoskeletal: Nontender with normal range of motion in all extremities.  Neurologic:  Normal speech and language. No gross  focal neurologic deficits  Skin:  Skin is warm, dry and intact.  Psychiatric: Mood and affect are normal.  ____________________________________________    EKG  EKG viewed and interpreted by myself shows a normal sinus rhythm at 96 bpm with a narrow QRS, normal axis, normal intervals, no concerning ST changes.  ____________________________________________    RADIOLOGY  Chest x-ray negative  ____________________________________________   INITIAL IMPRESSION / ASSESSMENT AND PLAN / ED COURSE  Pertinent labs & imaging results that were available during my care of the patient were reviewed by me and considered in my medical decision making (see chart for details).   Patient presents emergency department for dizziness which started this evening while she was going outside.  Patient states her symptoms have largely resolved.  Patient did take 1 mg of Ativan while at home.  Currently the patient appears well, clear breath sounds despite recent COVID infection.  We will obtain a chest x-ray as precaution.  Reassuring vital signs.  We will check labs and IV hydrate while awaiting results.  Patient agreeable to plan of care.  Differential would include dehydration, electrolyte or metabolic abnormality, continued COVID symptoms, anxiety, less likely ACS.  Patient's work-up is reassuring.  Troponin negative.  Labs are within normal limits.  Chest x-ray negative.  Patient is feeling much better.  We will discharge patient home with PCP follow-up.  [name] was evaluated in Emergency Department on 08/14/2020 for the symptoms described in the history of present illness. She was evaluated in the context of the global COVID-19 pandemic, which necessitated consideration that the patient might be at risk for infection with the SARS-CoV-2 virus that causes COVID-19. Institutional protocols and algorithms that pertain to the evaluation of patients at risk for COVID-19 are in a state of rapid change  based on information released by regulatory bodies including the CDC and federal and state organizations. These policies and algorithms were followed during the patient's care in the ED.  ____________________________________________   FINAL CLINICAL IMPRESSION(S) / ED DIAGNOSES  Dizziness Anxiety   Minna Antis, MD 08/14/20 2233

## 2020-08-14 NOTE — Progress Notes (Signed)
Hematology/Oncology Consult note [facility] Telephone:([phone] [phone]  Patient Care Team: Elizbeth Squires, MD as PCP - General (Internal Medicine)   Name of the patient: Robin Schwartz  [id number]  29-Jan-1971    Reason for referral-history of iron deficiency anemia   Referring physician-Dr. Vern Claude  Date of visit: 08/14/20   History of presenting illness- Patient is a 50 year old African-American female referred to [place] for iron deficiency anemia.  She has seen [facility] hematology in the past in March 2022 and was recommended IV iron but did not eventually get it.  She also follows up with pulmonary for symptoms of shortness of breath.  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  She has seen rheumatology once in the past for autoimmune work-up as well that had been unremarkable.  She had a cardiac MRI in the past which did not show an infiltrative process.  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  Most recent CBC from 07/31/2020 showed a white count of 3.4, H&H of 13.9/41 with an MCV of 75 and a platelet count of 187.  Her last iron studies were checked back in March 2022 which showed a ferritin level of 9.9 but an iron saturation of 93%.  Patient has been on oral iron on and off and most recently has been taking Vitron C  Patient has multiple complaints presently including feeling lightheaded at times as well as shortness of breath on exertion and heart palpitations.  She reports intermittent feeling of tingling in her hands and feet as well as dry mouth and feeling jittery at times.  Reports that her menstrual cycles usually last for about 5 to 6 days but when she recently had COVID her cycle lasted for about 9 to 10 days and she required a medication to slow it down.  ECOG PS- 0  Pain scale- 0   Review of systems- Review of Systems   Constitutional:  Positive for malaise/fatigue. Negative for chills, fever and weight loss.  HENT:  Negative for congestion, ear discharge and nosebleeds.   Eyes:  Negative for blurred vision.  Respiratory:  Positive for shortness of breath. Negative for cough, hemoptysis, sputum production and wheezing.   Cardiovascular:  Positive for palpitations. Negative for chest pain, orthopnea and claudication.  Gastrointestinal:  Negative for abdominal pain, blood in stool, constipation, diarrhea, heartburn, melena, nausea and vomiting.  Genitourinary:  Negative for dysuria, flank pain, frequency, hematuria and urgency.  Musculoskeletal:  Negative for back pain, joint pain and myalgias.  Skin:  Negative for rash.  Neurological:  Positive for dizziness. Negative for tingling, focal weakness, seizures, weakness and headaches.  Endo/Heme/Allergies:  Does not bruise/bleed easily.  Psychiatric/Behavioral:  Negative for depression and suicidal ideas. The patient does not have insomnia.    Allergies  Allergen Reactions   Contrast Media [Iodinated Diagnostic Agents]    Hydrocodone    Propoxyphene Other (See Comments) and Rash    Other reaction(s): Hallucination Makes her feel delusional Causes delusions Other reaction(s): Hallucination Makes her feel delusional Causes delusions Other reaction(s): Hallucination Makes her feel delusional     There are no problems to display for this patient.    Past Medical History:  Diagnosis Date   Anxiety      History reviewed. No pertinent surgical history.  Social History   Socioeconomic History   Marital status: Divorced    Spouse name: Not on  file   Number of children: Not on file   Years of education: Not on file   Highest education level: Not on file  Occupational History   Not on file  Tobacco Use   Smoking status: Never   Smokeless tobacco: Never  Vaping Use   Vaping Use: Never used  Substance and Sexual Activity   Alcohol use: Never    Drug use: Never   Sexual activity: Not Currently  Other Topics Concern   Not on file  Social History Narrative   Not on file   Social Determinants of Health   Financial Resource Strain: Not on file  Food Insecurity: Not on file  Transportation Needs: Not on file  Physical Activity: Not on file  Stress: Not on file  Social Connections: Not on file  Intimate Partner Violence: Not on file     Family History  Problem Relation Age of Onset   COPD Maternal Aunt    Hypertension Maternal Uncle    Asthma Maternal Uncle    COPD Maternal Uncle    Diabetes Maternal Grandmother    Asthma Maternal Grandmother    Cancer Maternal Grandfather    Colon cancer Maternal Grandfather      Current Outpatient Medications:    Ascorbic Acid (VITAMIN C) 1000 MG tablet, Take 1,000 mg by mouth daily., Disp: , Rfl:    Iron-Vitamin C (VITRON-C) 65-125 MG TABS, Take 1 tablet by mouth daily., Disp: , Rfl:    LORazepam (ATIVAN) 1 MG tablet, Take 1 mg by mouth every 6 (six) hours as needed for anxiety., Disp: , Rfl:    cyanocobalamin 1000 MCG tablet, Take 1,000 mcg by mouth daily. (Patient not taking: Reported on 08/13/2020), Disp: , Rfl:    ketorolac (TORADOL) 10 MG tablet, Take 1 tablet (10 mg total) by mouth every 6 (six) hours as needed for moderate pain or severe pain. (Patient not taking: Reported on 08/13/2020), Disp: 20 tablet, Rfl: 0   promethazine-dextromethorphan (PROMETHAZINE-DM) 6.25-15 MG/5ML syrup, Take 5 mLs by mouth 4 (four) times daily as needed. (Patient not taking: Reported on 08/13/2020), Disp: 118 mL, Rfl: 0   Physical exam:  Vitals:   08/13/20 1107  BP: 125/76  Pulse: 81  Resp: 16  Temp: 99.3 F (37.4 C)  TempSrc: Oral  Weight: 127 lb 6.8 oz (57.8 kg)  Height: 5' 4.6" (1.641 m)   Physical Exam Constitutional:      General: She is not in acute distress. Cardiovascular:     Rate and Rhythm: Normal rate and regular rhythm.     Heart sounds: Normal heart sounds.  Pulmonary:      Effort: Pulmonary effort is normal.     Breath sounds: Normal breath sounds.  Abdominal:     General: Bowel sounds are normal.     Palpations: Abdomen is soft.  Musculoskeletal:     Right lower leg: No edema.     Left lower leg: No edema.  Skin:    General: Skin is warm and dry.  Neurological:     Mental Status: She is alert and oriented to person, place, and time.       Assessment and plan- Patient is a 50 y.o. female referred for history of iron deficiency anemia  Iron deficiency anemia: Today I will plan to repeat her CBC ferritin and iron studies B12 folate LDH TSH reticulocyte count and ESR since it has been more than 3 months since her iron levels were checked.  Patient is currently on oral  iron once a day which she will continue.  Based on the results of the test we will decide if she truly has iron deficiency and whether she needs oral versus IV iron.  Cause of her iron deficiency has been attributed to Heavy menstrual cycles.  However given her age getting a GI evaluation at this time would be reasonable as well.   Thank you for this kind referral and the opportunity to participate in the care of this  Patient   Visit Diagnosis 1. History of iron deficiency     Dr. Randa Evens, MD, MPH [facility] [phone] 08/14/2020

## 2020-08-19 ENCOUNTER — Ambulatory Visit (INDEPENDENT_AMBULATORY_CARE_PROVIDER_SITE_OTHER): Payer: Self-pay | Admitting: Family Medicine

## 2020-08-19 ENCOUNTER — Ambulatory Visit
Admission: RE | Admit: 2020-08-19 | Discharge: 2020-08-19 | Disposition: A | Discharge status: Home / Self Care | Payer: PRIVATE HEALTH INSURANCE | Attending: Family Medicine | Admitting: Family Medicine

## 2020-08-19 ENCOUNTER — Ambulatory Visit
Admission: RE | Admit: 2020-08-19 | Discharge: 2020-08-19 | Disposition: A | Discharge status: Home / Self Care | Payer: PRIVATE HEALTH INSURANCE | Source: Ambulatory Visit | Attending: Family Medicine | Admitting: Family Medicine

## 2020-08-19 ENCOUNTER — Encounter: Payer: Self-pay | Admitting: Family Medicine

## 2020-08-19 ENCOUNTER — Other Ambulatory Visit: Payer: Self-pay

## 2020-08-19 VITALS — BP 122/86 | HR 74 | Temp 98.4°F | Ht 64.6 in | Wt 127.0 lb

## 2020-08-19 DIAGNOSIS — M62838 Other muscle spasm: Secondary | ICD-10-CM

## 2020-08-19 DIAGNOSIS — Z8639 Personal history of other endocrine, nutritional and metabolic disease: Secondary | ICD-10-CM

## 2020-08-19 DIAGNOSIS — R768 Other specified abnormal immunological findings in serum: Secondary | ICD-10-CM

## 2020-08-19 DIAGNOSIS — R519 Headache, unspecified: Secondary | ICD-10-CM

## 2020-08-19 DIAGNOSIS — D649 Anemia, unspecified: Secondary | ICD-10-CM

## 2020-08-19 LAB — BASIC METABOLIC PANEL
BUN: 8 (ref 4–21)
CO2: 25 — AB (ref 13–22)
Chloride: 103 (ref 99–108)
Creatinine: 0.7 (ref 0.5–1.1)
Glucose: 82
Potassium: 4 (ref 3.4–5.3)
Sodium: 141 (ref 137–147)

## 2020-08-19 LAB — IRON,TIBC AND FERRITIN PANEL
Ferritin: 53
Iron: 16
TIBC: 344

## 2020-08-19 LAB — CBC AND DIFFERENTIAL
HCT: 31 — AB (ref 36–46)
Hemoglobin: 9.3 — AB (ref 12.0–16.0)
Neutrophils Absolute: 2.3
Platelets: 373 (ref 150–399)
WBC: 5.2

## 2020-08-19 LAB — CBC: RBC: 3.85 — AB (ref 3.87–5.11)

## 2020-08-19 LAB — TSH: TSH: 1.11 (ref 0.41–5.90)

## 2020-08-19 LAB — VITAMIN B12: Vitamin B-12: 649

## 2020-08-19 LAB — VITAMIN D 25 HYDROXY (VIT D DEFICIENCY, FRACTURES): Vit D, 25-Hydroxy: 31

## 2020-08-19 MED ORDER — CYCLOBENZAPRINE HCL 5 MG PO TABS: 5.0000 mg | ORAL_TABLET | Freq: Every evening | ORAL | 1 refills | Status: DC | PRN

## 2020-08-19 NOTE — Assessment & Plan Note (Signed)
Patient with history of headache disorder, her description is most consistent with tension type headache with bilateral symmetric paracervical tightness, bandlike distribution.  Physical exam findings are consistent with significant paraspinal cervical muscle spasm, plan for plain films of the cervical spine, as needed cyclobenzaprine was prescribed.  Her cardiopulmonary and abdominal examination findings were benign, cranial nerve testing neurologic testing was otherwise benign.

## 2020-08-19 NOTE — Assessment & Plan Note (Signed)
Patient with serial low hemoglobin, recent ER visit for near syncope.  History of prolonged recent menses being actively followed by OB/GYN.  I will plan for recheck of her CBC, anemia panel was ordered as well.

## 2020-08-19 NOTE — Progress Notes (Signed)
Primary Care / Sports Medicine Office Visit  Patient Information:  Patient ID: Robin Schwartz, female DOB: 09/04/1970 Age: 50 y.o. MRN: [id number]   Robin Schwartz is a pleasant 50 y.o. 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   Iron deficiency anemia 05/02/2020   Seasonal allergies 06/07/2019   Dysmenorrhea 02/21/2019   MEE (middle ear effusion), bilateral 02/21/2019   Cervical stenosis of spine 06/08/2018   Hand joint pain 06/08/2018   Positive antinuclear antibody 06/08/2018   Anxiety 07/14/2016   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   Chronic abdominal pain 10/10/2014   Headache disorder 12/12/2013   Patellofemoral dysfunction 11/15/2013   Stress incontinence 08/10/2013   Back pain 08/09/2013   Bursitis of both hips 08/09/2013   Other bursitis of hip, unspecified hip 08/09/2013   H/O vitamin D deficiency 06/23/2013   Personal history of other endocrine, nutritional and metabolic disease 97/67/3419   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   Iron-Vitamin C (VITRON-C) 65-125 MG TABS Take 1 tablet by mouth daily.   LORazepam (ATIVAN) 1 MG tablet Take 1 mg by mouth every 6 (six) hours as needed for anxiety.   omeprazole (PRILOSEC) 20 MG capsule Take 20 mg by mouth as needed.   [DISCONTINUED] Ascorbic Acid (VITAMIN C) 1000 MG tablet Take 1,000 mg by mouth daily.   [DISCONTINUED] cyanocobalamin 1000 MCG tablet Take 1,000 mcg by mouth daily. (Patient not taking: Reported on 08/13/2020)   No facility-administered encounter medications on file as of 08/19/2020.   Past Surgical History:  Procedure Laterality Date   BREAST EXCISIONAL BIOPSY Left 1992   CYSTOSCOPY  2003   LIPOMA EXCISION Left 2007   shoulder    Vitals:   08/19/20 1046  BP: 122/86  Pulse: 74  Temp: 98.4 F (36.9 C)  SpO2: 100%   Vitals:   08/19/20 1046  Weight: 127 lb (57.6 kg)  Height: 5' 4.6" (1.641 m)   Body mass index is 21.4 kg/m.  DG Chest Portable 1 View  Result Date: 08/14/2020 CLINICAL DATA:  Chest pain EXAM: PORTABLE CHEST 1 VIEW COMPARISON:  02/15/2020 FINDINGS: The heart size and mediastinal contours are within normal limits. Both lungs are clear. The visualized skeletal structures are unremarkable. IMPRESSION: No active disease. Electronically Signed   By: Donavan Foil M.D.   On: 08/14/2020 20:01     Independent interpretation of notes and tests performed by another provider:   None  Procedures performed:   None  Pertinent  History, Exam, Impression, and Recommendations:   H/O vitamin D deficiency Patient with history of vitamin D deficiency, plan for recheck to assess need for additional supplementation  Headache disorder Patient with history of headache disorder, her description is most consistent with tension type headache with bilateral symmetric paracervical tightness, bandlike distribution.  Physical exam findings are consistent with significant paraspinal cervical muscle  spasm, plan for plain films of the cervical spine, as needed cyclobenzaprine was prescribed.  Her cardiopulmonary and abdominal examination findings were benign, cranial nerve testing neurologic testing was otherwise benign.  Personal history of other endocrine, nutritional and metabolic disease Medical chart review reveals the same, subjective concerns over constellation of arthralgias, fatigue.  Physical exam is pertinent for conjunctival pallor, no thyromegaly, clear lung fields throughout, no abnormal cardiac sounds, abdomen is soft, nontender with no hepatosplenomegaly and slightly hypoactive bowel sounds.  Risk stratification labs to be obtained, will follow this up closely at her return in 2 weeks  Positive antinuclear antibody Patient with history of the same, polyarthralgia stated concern, will recheck rheumatological labs and follow-up accordingly.  Anemia Patient with serial low hemoglobin, recent ER visit for near syncope.  History of prolonged recent menses being actively followed by OB/GYN.  I will plan for recheck of her CBC, anemia panel was ordered as well.  Cervical paraspinous muscle spasm Patient with stated history of headache, physical exam findings are more consistent with tension type pattern with significant paraspinal symmetric cervical spasm noted.  Plan for prescription for Flexeril nightly on appearing basis, supportive care, x-rays of the cervical spine, and close follow-up in 2 weeks.  If appropriate clinical response noted, home-based versus formal physical therapy to commence.  If suboptimal progress noted, pending films, further escalation of pharmacotherapy to be considered.  She is planning on initiating duloxetine 20 mg as well, this can be titrated accordingly.    Orders & Medications Meds ordered this encounter  Medications   cyclobenzaprine (FLEXERIL) 5 MG tablet    Sig: Take 1 tablet (5 mg total) by mouth at bedtime as needed for muscle spasms.     Dispense:  30 tablet    Refill:  1    Orders Placed This Encounter  Procedures   DG Cervical Spine Complete   CBC with Differential/Platelet   Iron, TIBC and Ferritin Panel   B12 and Folate Panel   Comprehensive metabolic panel   TSH   T4, free   T3, free   VITAMIN D 25 Hydroxy (Vit-D Deficiency, Fractures)   ANA   C-reactive protein   Sed Rate (ESR)   Rheumatoid Factor   CYCLIC CITRUL PEPTIDE ANTIBODY, IGG/IGA   Uric acid      Return in about 2 weeks (around 09/02/2020).     Montel Culver, MD   Primary Care Sports Medicine [place]

## 2020-08-19 NOTE — Assessment & Plan Note (Signed)
Patient with history of vitamin D deficiency, plan for recheck to assess need for additional supplementation

## 2020-08-19 NOTE — Assessment & Plan Note (Signed)
Patient with history of the same, polyarthralgia stated concern, will recheck rheumatological labs and follow-up accordingly.

## 2020-08-19 NOTE — Assessment & Plan Note (Signed)
Patient with stated history of headache, physical exam findings are more consistent with tension type pattern with significant paraspinal symmetric cervical spasm noted.  Plan for prescription for Flexeril nightly on appearing basis, supportive care, x-rays of the cervical spine, and close follow-up in 2 weeks.  If appropriate clinical response noted, home-based versus formal physical therapy to commence.  If suboptimal progress noted, pending films, further escalation of pharmacotherapy to be considered.  She is planning on initiating duloxetine 20 mg as well, this can be titrated accordingly.

## 2020-08-19 NOTE — Assessment & Plan Note (Signed)
Medical chart review reveals the same, subjective concerns over constellation of arthralgias, fatigue.  Physical exam is pertinent for conjunctival pallor, no thyromegaly, clear lung fields throughout, no abnormal cardiac sounds, abdomen is soft, nontender with no hepatosplenomegaly and slightly hypoactive bowel sounds.  Risk stratification labs to be obtained, will follow this up closely at her return in 2 weeks

## 2020-08-19 NOTE — Patient Instructions (Addendum)
-   Obtain blood work and x-rays with orders provided - Can trial cyclobenzaprine at bedtime for muscle tightness pain, side effects can be drowsiness - Start Cymbalta (duloxetine) 20 mg nightly - Can utilize moist heat and gentle neck motion for additional symptom control - Use previously prescribed eyedrops for your eyes - Maintain follow-up with hematologist and pulmonologist as discussed - Return for follow-up in 2 weeks, contact us for any questions between now and then

## 2020-08-20 ENCOUNTER — Inpatient Hospital Stay (HOSPITAL_BASED_OUTPATIENT_CLINIC_OR_DEPARTMENT_OTHER): Payer: PRIVATE HEALTH INSURANCE | Admitting: Oncology

## 2020-08-20 ENCOUNTER — Telehealth: Payer: PRIVATE HEALTH INSURANCE | Admitting: Oncology

## 2020-08-20 DIAGNOSIS — Z8639 Personal history of other endocrine, nutritional and metabolic disease: Secondary | ICD-10-CM

## 2020-08-20 NOTE — Progress Notes (Signed)
I connected with Robin Schwartz on 08/20/20 at  2:30 PM EDT by video enabled telemedicine visit and verified that I am speaking with the correct person using two identifiers.   I discussed the limitations, risks, security and privacy concerns of performing an evaluation and management service by telemedicine and the availability of in-person appointments. I also discussed with the patient that there may be a patient responsible charge related to this service. The patient expressed understanding and agreed to proceed.  Other persons participating in the visit and their role in the encounter:  none  Patient's location:  home Provider's location:  work  Risk analyst Complaint: Discuss results of blood work  History of present illness: Patient is a 50 year old African-American female referred to [place] for iron deficiency anemia.  She has seen [facility] hematology in the past in March 2022 and was recommended IV iron but did not eventually get it.  She also follows up with pulmonary for symptoms of shortness of breath.  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  She has seen rheumatology once in the past for autoimmune work-up as well that had been unremarkable.  She had a cardiac MRI in the past which did not show an infiltrative process.  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  Most recent CBC from 07/31/2020 showed a white count of 3.4, H&H of 13.9/41 with an MCV of 75 and a platelet count of 187.  Her last iron studies were checked back in March 2022 which showed a ferritin level of 9.9 but an iron saturation of 93%.  Patient has been on oral iron on and off and most recently has been taking Vitron C   Patient has multiple complaints presently including feeling lightheaded at times as well as shortness of breath on exertion and heart palpitations.  She reports intermittent feeling of tingling in her hands and  feet as well as dry mouth and feeling jittery at times.    Results of blood work from 08/13/2020 showed Low ferritin of 23, H&H of 9.8/31.3 with an MCV of 77.9 and platelet count of 427.  ESR was normal, B12 folate and TSH normal.  LDH normal.  Interval history patient was in the ER a day after her visit with me last time for symptoms of dizziness and feeling like passing out.  Her ER work-up was unremarkable   Review of Systems  Constitutional:  Positive for malaise/fatigue. Negative for chills, fever and weight loss.  HENT:  Negative for congestion, ear discharge and nosebleeds.   Eyes:  Negative for blurred vision.  Respiratory:  Negative for cough, hemoptysis, sputum production, shortness of breath and wheezing.   Cardiovascular:  Positive for palpitations. Negative for chest pain, orthopnea and claudication.  Gastrointestinal:  Negative for abdominal pain, blood in stool, constipation, diarrhea, heartburn, melena, nausea and vomiting.  Genitourinary:  Negative for dysuria, flank pain, frequency, hematuria and urgency.  Musculoskeletal:  Negative for back pain, joint pain and myalgias.  Skin:  Negative for rash.  Neurological:  Negative for dizziness, tingling, focal weakness, seizures, weakness and headaches.  Endo/Heme/Allergies:  Does not bruise/bleed easily.  Psychiatric/Behavioral:  Negative for depression and suicidal ideas. The patient does not have insomnia.    Allergies  Allergen Reactions   Contrast Media [Iodinated Diagnostic Agents] Swelling    Throat swelling   Hydrocodone Other (See Comments)    Mentally distorted per patient  Propoxyphene Rash and Other (See Comments)    Hallucination    Past Medical History:  Diagnosis Date   Anemia    Anxiety     Past Surgical History:  Procedure Laterality Date   BREAST EXCISIONAL BIOPSY Left 1992   CYSTOSCOPY  2003   LIPOMA EXCISION Left 2007   shoulder    Social History   Socioeconomic History   Marital status:  Divorced    Spouse name: Not on file   Number of children: 3   Years of education: 56   Highest education level: Master's degree (e.g., MA, MS, MEng, MEd, MSW, MBA)  Occupational History   Occupation: Unemployed  Tobacco Use   Smoking status: Never   Smokeless tobacco: Never  Vaping Use   Vaping Use: Never used  Substance and Sexual Activity   Alcohol use: Not Currently   Drug use: Never   Sexual activity: Not Currently  Other Topics Concern   Not on file  Social History Narrative   Not on file   Social Determinants of Health   Financial Resource Strain: Not on file  Food Insecurity: Not on file  Transportation Needs: Not on file  Physical Activity: Not on file  Stress: Not on file  Social Connections: Not on file  Intimate Partner Violence: Not on file    Family History  Problem Relation Age of Onset   Stroke Mother    COPD Maternal Aunt    Hypertension Maternal Uncle    Asthma Maternal Uncle    COPD Maternal Uncle    Diabetes Maternal Grandmother    Asthma Maternal Grandmother    Cancer Maternal Grandfather    Colon cancer Maternal Grandfather      Current Outpatient Medications:    DULoxetine (CYMBALTA) 20 MG capsule, Take 20 mg by mouth daily., Disp: , Rfl:    Iron-Vitamin C (VITRON-C) 65-125 MG TABS, Take 1 tablet by mouth daily., Disp: , Rfl:    cyclobenzaprine (FLEXERIL) 5 MG tablet, Take 1 tablet (5 mg total) by mouth at bedtime as needed for muscle spasms. (Patient not taking: Reported on 08/20/2020), Disp: 30 tablet, Rfl: 1   LORazepam (ATIVAN) 1 MG tablet, Take 1 mg by mouth every 6 (six) hours as needed for anxiety. (Patient not taking: Reported on 08/20/2020), Disp: , Rfl:    omeprazole (PRILOSEC) 20 MG capsule, Take 20 mg by mouth as needed. (Patient not taking: Reported on 08/20/2020), Disp: , Rfl:   DG Cervical Spine Complete  Result Date: 08/20/2020 CLINICAL DATA:  Acute on chronic neck pain. EXAM: CERVICAL SPINE - COMPLETE 4+ VIEW COMPARISON:   None. FINDINGS: Minimal grade 1 retrolisthesis is noted at C4-5 and C5-6 secondary to moderate degenerative disc disease at these levels. No definite fracture is noted. No prevertebral soft tissue swelling is noted. No significant neural foraminal stenosis is noted. IMPRESSION: Moderate multilevel degenerative disc disease. No acute abnormality seen. Electronically Signed   By: Marijo Conception M.D.   On: 08/20/2020 09:52   DG Chest Portable 1 View  Result Date: 08/14/2020 CLINICAL DATA:  Chest pain EXAM: PORTABLE CHEST 1 VIEW COMPARISON:  02/15/2020 FINDINGS: The heart size and mediastinal contours are within normal limits. Both lungs are clear. The visualized skeletal structures are unremarkable. IMPRESSION: No active disease. Electronically Signed   By: Donavan Foil M.D.   On: 08/14/2020 20:01    No images are attached to the encounter.   CMP Latest Ref Rng & Units 08/14/2020  Glucose 70 - 99  mg/dL 99  BUN 6 - 20 mg/dL 5(L)  Creatinine 0.44 - 1.00 mg/dL 0.65  Sodium 135 - 145 mmol/L 139  Potassium 3.5 - 5.1 mmol/L 3.3(L)  Chloride 98 - 111 mmol/L 105  CO2 22 - 32 mmol/L 23  Calcium 8.9 - 10.3 mg/dL 9.0  Total Protein 6.5 - 8.1 g/dL 7.0  Total Bilirubin 0.3 - 1.2 mg/dL 0.7  Alkaline Phos 38 - 126 U/L 32(L)  AST 15 - 41 U/L 24  ALT 0 - 44 U/L 11   CBC Latest Ref Rng & Units 08/14/2020  WBC 4.0 - 10.5 K/uL 6.0  Hemoglobin 12.0 - 15.0 g/dL 9.6(L)  Hematocrit 36.0 - 46.0 % 30.4(L)  Platelets 150 - 400 K/uL 385     Observation/objective: Appears in no acute distress over video visit today.  Breathing is nonlabored  Assessment and plan: Patient is a 50 year old female with history of iron deficiency anemia here to discuss results of blood work  Discussed the results of blood work which shows evidence of microcytic anemia with an MCV of 77 with an H&H of 9.8/31.3.  Ferritin levels were low at 23 indicative of iron deficiency.  Iron saturation was elevated at 43% which can be seen in  patients taking oral iron since serum iron levels are dependent on oral iron intake as well.  Low ferritin levels to indicate iron deficiency and she would benefit from iron supplementation.  Discussed moving forward we have 2 options doing oral iron 1 tablet a day or every other day for better absorption versus IV iron.  Based on her insurance we will plan to give her 2 doses of Feraheme 510 mg IV weekly.  Discussed risks and benefits of Feraheme including all but not limited to headache, leg spasms and possible risk of infusion reaction.  Discussed that premeds are typically not recommended prior to doing Feraheme.  We could give her a small dose to see how she is doing before escalating to full infusion.  Patient does have multiple symptoms at baseline including dizziness, intermittent tingling numbness in her extremities palpitations.  It is unclear if her symptoms are all related to iron deficiency and will get better but certainly given her anemia it would be worthwhile to offer iron supplementation.  Patient understands her options between oral and IV iron and has decided to pursue IV iron.  Discussed that with regards to etiology of iron deficiency anemia it could be secondary to her heavy menstrual cycles versus assessment for GI bleeding.  She has undergone EGD and colonoscopy recently at [facility].  Recommend discussion with GI to talk about capsule endoscopy as well.  With regards to her menstrual cycles they lasted for about 9 days after COVID but historically they have not been heavy.  Follow-up instructions: CBC ferritin and iron studies in 6 to 8 weeks and I will see her thereafter  I discussed the assessment and treatment plan with the patient. The patient was provided an opportunity to ask questions and all were answered. The patient agreed with the plan and demonstrated an understanding of the instructions.   The patient was advised to call back or seek an in-person evaluation if the  symptoms worsen or if the condition fails to improve as anticipated.    Visit Diagnosis: 1. History of iron deficiency     Dr. Randa Evens, MD, MPH [facility] Tel- [phone] 08/20/2020 3:30 PM

## 2020-08-25 ENCOUNTER — Encounter: Payer: Self-pay | Admitting: Family Medicine

## 2020-08-26 ENCOUNTER — Encounter: Payer: Self-pay | Admitting: Family Medicine

## 2020-08-26 ENCOUNTER — Encounter: Payer: Self-pay | Admitting: Oncology

## 2020-08-26 NOTE — Telephone Encounter (Signed)
Faxed labs to Dr. Assunta Gambles office at [phone].  Will abstract lab results later.

## 2020-08-26 NOTE — Telephone Encounter (Signed)
    Primary Care / Sports Medicine Telephone Note  Patient Information:  Patient ID: Robin Schwartz, female DOB: 1971/01/15 Age: 50 y.o. MRN: [id number]   Patient's labs did not result in the EMR and our office was able to obtain them via fax after reaching out to [facility].  Recent studies are significant for further decrease in iron studies and hemoglobin consistent with known ongoing iron deficiency anemia and associated workup through GYN and GI.   Patient was updated on recent labs and need for ongoing follow-up with GI (she has reached out to their office for capsule endoscopy) and GYN (plans to check in with them in regards to noted fibroids/polyps in light of recent serum studies). She is amenable to plan moving forward.  I did have a chance to connect to her hematologist/oncologist Dr. Smith Robert as well to provide update, current plans for iron transfusion this Thursday.  Our office will fax records over as they are not appearing in EMR.  Jerrol Banana, MD   Primary Care Sports Medicine [facility]

## 2020-08-27 ENCOUNTER — Other Ambulatory Visit: Payer: Self-pay | Admitting: Family Medicine

## 2020-08-27 ENCOUNTER — Encounter: Payer: Self-pay | Admitting: Oncology

## 2020-08-27 DIAGNOSIS — D509 Iron deficiency anemia, unspecified: Secondary | ICD-10-CM

## 2020-08-27 NOTE — Telephone Encounter (Signed)
Please advise 

## 2020-08-27 NOTE — Telephone Encounter (Signed)
Please advise for GI referral.  Patient has appointment with Dr. Smith Robert tomorrow.

## 2020-08-28 ENCOUNTER — Other Ambulatory Visit: Payer: Self-pay

## 2020-08-28 ENCOUNTER — Inpatient Hospital Stay: Payer: PRIVATE HEALTH INSURANCE | Attending: Oncology

## 2020-08-28 ENCOUNTER — Other Ambulatory Visit: Payer: Self-pay | Admitting: Oncology

## 2020-08-28 VITALS — BP 99/67 | HR 78 | Temp 97.2°F | Resp 18

## 2020-08-28 DIAGNOSIS — T454X5A Adverse effect of iron and its compounds, initial encounter: Secondary | ICD-10-CM

## 2020-08-28 DIAGNOSIS — D508 Other iron deficiency anemias: Secondary | ICD-10-CM

## 2020-08-28 DIAGNOSIS — D509 Iron deficiency anemia, unspecified: Secondary | ICD-10-CM | POA: Insufficient documentation

## 2020-08-28 LAB — PREGNANCY, URINE: Preg Test, Ur: NEGATIVE

## 2020-08-28 MED ORDER — SODIUM CHLORIDE 0.9 % IV SOLN
Freq: Once | INTRAVENOUS | Status: AC
  Filled 2020-08-28: qty 250

## 2020-08-28 MED ORDER — SODIUM CHLORIDE 0.9 % IV SOLN
510.0000 mg | INTRAVENOUS | Status: DC
  Administered 2020-08-28: 510 mg via INTRAVENOUS
  Filled 2020-08-28: qty 17

## 2020-08-28 MED ORDER — DIPHENHYDRAMINE HCL 50 MG/ML IJ SOLN
25.0000 mg | Freq: Once | INTRAMUSCULAR | Status: AC
  Administered 2020-08-28: 25 mg via INTRAVENOUS

## 2020-08-28 MED ORDER — DIPHENHYDRAMINE HCL 50 MG/ML IJ SOLN
INTRAMUSCULAR | Status: AC
  Filled 2020-08-28: qty 1

## 2020-08-28 NOTE — Progress Notes (Signed)
New Feraheme started slowly @ 200cc per hr per Dr. Assunta Gambles request, because patient is very anxious about getting the medicine. After 20 cc infused, patient started complaining of an itchy throat. Medication stopped and NS started at 200 cc per hr. Dr. Smith Robert notified; per Dr. Assunta Gambles verbal order, gave patient benadryl 25 mg IV, and observed patient for 10 minutes. Symptoms subsided and feraheme restarted at 200 cc per hr. Patient tolerated well. Patient observed for 30 minutes post infusion without incident. Patient was provided with follow up instructions and AVS printed. Patient stable and ambulatory upon discharge.

## 2020-08-30 LAB — CBC WITH DIFFERENTIAL/PLATELET
Basophils Absolute: 0.1 10*3/uL (ref 0.0–0.2)
Basos: 2 %
EOS (ABSOLUTE): 0.4 10*3/uL (ref 0.0–0.4)
Eos: 8 %
Hematocrit: 30.7 % — ABNORMAL LOW (ref 34.0–46.6)
Hemoglobin: 9.3 g/dL — ABNORMAL LOW (ref 11.1–15.9)
Immature Grans (Abs): 0 10*3/uL (ref 0.0–0.1)
Immature Granulocytes: 0 %
Lymphocytes Absolute: 1.9 10*3/uL (ref 0.7–3.1)
Lymphs: 37 %
MCH: 24.2 pg — ABNORMAL LOW (ref 26.6–33.0)
MCHC: 30.3 g/dL — ABNORMAL LOW (ref 31.5–35.7)
MCV: 80 fL (ref 79–97)
Monocytes Absolute: 0.6 10*3/uL (ref 0.1–0.9)
Monocytes: 11 %
Neutrophils Absolute: 2.3 10*3/uL (ref 1.4–7.0)
Neutrophils: 42 %
Platelets: 373 10*3/uL (ref 150–450)
RBC: 3.85 x10E6/uL (ref 3.77–5.28)
RDW: 15.3 % (ref 11.7–15.4)
WBC: 5.2 10*3/uL (ref 3.4–10.8)

## 2020-08-30 LAB — COMPREHENSIVE METABOLIC PANEL
ALT: 5 IU/L (ref 0–32)
AST: 17 IU/L (ref 0–40)
Albumin/Globulin Ratio: 1.7 (ref 1.2–2.2)
Albumin: 4.3 g/dL (ref 3.8–4.8)
Alkaline Phosphatase: 51 IU/L (ref 44–121)
BUN/Creatinine Ratio: 12 (ref 9–23)
BUN: 8 mg/dL (ref 6–24)
Bilirubin Total: 0.2 mg/dL (ref 0.0–1.2)
CO2: 25 mmol/L (ref 20–29)
Calcium: 9.4 mg/dL (ref 8.7–10.2)
Chloride: 103 mmol/L (ref 96–106)
Creatinine, Ser: 0.66 mg/dL (ref 0.57–1.00)
Globulin, Total: 2.6 g/dL (ref 1.5–4.5)
Glucose: 82 mg/dL (ref 65–99)
Potassium: 4 mmol/L (ref 3.5–5.2)
Sodium: 141 mmol/L (ref 134–144)
Total Protein: 6.9 g/dL (ref 6.0–8.5)
eGFR: 108 mL/min/{1.73_m2} (ref >59)

## 2020-08-30 LAB — ANA: ANA Titer 1: NEGATIVE

## 2020-08-30 LAB — B12 AND FOLATE PANEL
Folate: 11 ng/mL (ref >3.0)
Vitamin B-12: 649 pg/mL (ref 232–1245)

## 2020-08-30 LAB — C-REACTIVE PROTEIN: CRP: <1 mg/L (ref 0–10)

## 2020-08-30 LAB — IRON,TIBC AND FERRITIN PANEL
Ferritin: 53 ng/mL (ref 15–150)
Iron Saturation: 5 % — CL (ref 15–55)
Iron: 16 ug/dL — ABNORMAL LOW (ref 27–159)
Total Iron Binding Capacity: 344 ug/dL (ref 250–450)
UIBC: 328 ug/dL (ref 131–425)

## 2020-08-30 LAB — T3, FREE: T3, Free: 2.3 pg/mL (ref 2.0–4.4)

## 2020-08-30 LAB — CYCLIC CITRUL PEPTIDE ANTIBODY, IGG/IGA: Cyclic Citrullin Peptide Ab: 5 units (ref 0–19)

## 2020-08-30 LAB — TSH: TSH: 1.11 u[IU]/mL (ref 0.450–4.500)

## 2020-08-30 LAB — VITAMIN D 25 HYDROXY (VIT D DEFICIENCY, FRACTURES): Vit D, 25-Hydroxy: 31 ng/mL (ref 30.0–100.0)

## 2020-08-30 LAB — SEDIMENTATION RATE: Sed Rate: 14 mm/hr (ref 0–32)

## 2020-08-30 LAB — T4, FREE: Free T4: 1.05 ng/dL (ref 0.82–1.77)

## 2020-08-30 LAB — URIC ACID: Uric Acid: 3.4 mg/dL (ref 2.6–6.2)

## 2020-08-30 LAB — RHEUMATOID FACTOR: Rheumatoid fact SerPl-aCnc: <10 IU/mL (ref <14.0)

## 2020-09-01 ENCOUNTER — Other Ambulatory Visit: Payer: Self-pay

## 2020-09-01 ENCOUNTER — Encounter: Payer: Self-pay | Admitting: Family Medicine

## 2020-09-01 ENCOUNTER — Ambulatory Visit (INDEPENDENT_AMBULATORY_CARE_PROVIDER_SITE_OTHER): Payer: Self-pay | Admitting: Family Medicine

## 2020-09-01 VITALS — BP 108/76 | HR 76 | Temp 98.7°F | Ht 64.6 in | Wt 123.4 lb

## 2020-09-01 DIAGNOSIS — F411 Generalized anxiety disorder: Secondary | ICD-10-CM | POA: Diagnosis not present

## 2020-09-01 DIAGNOSIS — M62838 Other muscle spasm: Secondary | ICD-10-CM

## 2020-09-01 DIAGNOSIS — M47812 Spondylosis without myelopathy or radiculopathy, cervical region: Secondary | ICD-10-CM | POA: Diagnosis not present

## 2020-09-01 NOTE — Progress Notes (Signed)
Primary Care / Sports Medicine Office Visit  Patient Information:  Patient ID: Robin Schwartz, female DOB: [birth year] Age: 50 y.o. MRN: [id number]   Robin Schwartz is a pleasant 50 y.o. 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   Iron deficiency anemia 05/02/2020   Seasonal allergies 06/07/2019   Dysmenorrhea 02/21/2019   MEE (middle ear effusion), bilateral 02/21/2019   Cervical stenosis of spine 06/08/2018   Hand joint pain 06/08/2018   Positive antinuclear antibody 06/08/2018   Anxiety 07/14/2016   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   Chronic abdominal pain 10/10/2014   Headache disorder 12/12/2013   Patellofemoral dysfunction 11/15/2013   Stress incontinence 08/10/2013   Back pain 08/09/2013   Bursitis of both hips 08/09/2013   Other bursitis of hip, unspecified hip 08/09/2013   H/O vitamin D deficiency 06/23/2013   Personal history of other endocrine, nutritional and metabolic disease 94/49/6759   Iliotibial band syndrome 05/31/2013   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   ibuprofen (ADVIL) 200 MG tablet Take 200 mg by mouth daily as needed.   LORazepam (ATIVAN) 1 MG tablet Take 1 mg by mouth every 6 (six) hours as needed for anxiety.   omeprazole (PRILOSEC) 20 MG capsule Take 20 mg by mouth as needed.   cyclobenzaprine (FLEXERIL) 5 MG tablet Take 1 tablet (5 mg total) by mouth at bedtime as needed for muscle spasms. (Patient not taking: No sig reported)   DULoxetine (CYMBALTA) 20 MG capsule Take 20 mg by mouth daily. (Patient not taking: Reported on 09/01/2020)   Iron-Vitamin C (VITRON-C) 65-125 MG TABS Take 1 tablet by mouth daily. (Patient not taking: Reported on 09/01/2020)   No facility-administered encounter medications on file as of 09/01/2020.   Past Surgical History:  Procedure Laterality Date   BREAST EXCISIONAL BIOPSY Left 1992   CYSTOSCOPY  2003   LIPOMA EXCISION Left 2007   shoulder    Vitals:   09/01/20 1429  BP: 108/76  Pulse: 76  Temp: 98.7 F (37.1 C)  SpO2: 100%   Vitals:   09/01/20 1429  Weight: 123 lb 6.4 oz (56 kg)  Height: 5' 4.6" (1.641 m)   Body mass index is 20.79 kg/m.  DG Cervical Spine Complete  Result Date: 08/20/2020 CLINICAL DATA:  Acute on chronic neck pain. EXAM: CERVICAL SPINE - COMPLETE 4+ VIEW COMPARISON:  None. FINDINGS: Minimal grade 1 retrolisthesis is noted at C4-5 and C5-6 secondary to moderate degenerative disc disease at these levels. No definite fracture is noted. No prevertebral soft tissue swelling is noted. No significant neural foraminal stenosis is noted. IMPRESSION: Moderate multilevel  degenerative disc disease. No acute abnormality seen. Electronically Signed   By: Lupita Raider M.D.   On: 08/20/2020 09:52   DG Chest Portable 1 View  Result Date: 08/14/2020 CLINICAL DATA:  Chest pain EXAM: PORTABLE CHEST 1 VIEW COMPARISON:  02/15/2020 FINDINGS: The heart size and mediastinal contours are within normal limits. Both lungs are clear. The visualized skeletal structures are unremarkable.  IMPRESSION: No active disease. Electronically Signed   By: Jasmine Pang M.D.   On: 08/14/2020 20:01     Independent interpretation of notes and tests performed by another provider:   Independent interpretation of cervical spine films dated 08/19/2020 reveals focal intervertebral narrowing at C4-5, C5-6, anterior endplate osteophyte formation noted at these levels in addition to facet arthropathy, there is subtle retrolisthesis of C5 on C6, no significant foraminal narrowing noted  Procedures performed:   None  Pertinent History, Exam, Impression, and Recommendations:   Cervical spondylosis Patient with cervical spondylosis related symptomatology, has not had an opportunity to continue with duloxetine, this was revisited and she is amenable to a restart at 20 mg with titration every 2 weeks to a goal of 60 mg nightly.  I have advised her on the adverse effect profile and to monitor symptoms/symptom change.  Additionally, given the extensive paraspinal spasm noted, she is amenable to formal physical therapy and a referral was placed today.  I have advised her to refrain from NSAIDs during ongoing GI evaluation given her noted anemia.  She can dose cyclobenzaprine which was previously prescribed as well.  Rx management took place in regards to appropriate dosing, monitoring for adverse effects, expected response and timeline, questions answered.  Cervical paraspinous muscle spasm See additional assessment(s) for plan details.   Generalized anxiety disorder Patient should an establishment of care with local group, referral was placed in that regard today.     Orders & Medications No orders of the defined types were placed in this encounter.  Orders Placed This Encounter  Procedures   Ambulatory referral to Physical Therapy   Ambulatory referral to Psychiatry     Return in about 8 weeks (around 10/27/2020).     Jerrol Banana, MD   Primary Care Sports Medicine [facility]

## 2020-09-01 NOTE — Assessment & Plan Note (Signed)
See additional assessment(s) for plan details. 

## 2020-09-01 NOTE — Assessment & Plan Note (Addendum)
Patient with cervical spondylosis related symptomatology, has not had an opportunity to continue with duloxetine, this was revisited and she is amenable to a restart at 20 mg with titration every 2 weeks to a goal of 60 mg nightly.  I have advised her on the adverse effect profile and to monitor symptoms/symptom change.  Additionally, given the extensive paraspinal spasm noted, she is amenable to formal physical therapy and a referral was placed today.  I have advised her to refrain from NSAIDs during ongoing GI evaluation given her noted anemia.  She can dose cyclobenzaprine which was previously prescribed as well.  Rx management took place in regards to appropriate dosing, monitoring for adverse effects, expected response and timeline, questions answered.

## 2020-09-01 NOTE — Patient Instructions (Addendum)
-   Restart Cymbalta (duloxetine) as discussed, if noting any thoughts of self-harm, other sensations of concern, discontinue medication - Can dose cyclobenzaprine (muscle relaxer) nightly on an as-needed basis - Contact physical therapy and start visits, they will instruct you on home exercises to perform daily - Return for follow-up in 8 weeks, contact for questions between now and then  [facility]:  [place]:  [phone]

## 2020-09-01 NOTE — Assessment & Plan Note (Signed)
Patient should an establishment of care with local group, referral was placed in that regard today.

## 2020-09-03 ENCOUNTER — Inpatient Hospital Stay: Payer: PRIVATE HEALTH INSURANCE

## 2020-09-04 ENCOUNTER — Other Ambulatory Visit: Payer: Self-pay

## 2020-09-04 ENCOUNTER — Inpatient Hospital Stay: Payer: PRIVATE HEALTH INSURANCE

## 2020-09-04 VITALS — BP 116/74 | HR 80 | Temp 99.1°F | Resp 18

## 2020-09-04 DIAGNOSIS — D508 Other iron deficiency anemias: Secondary | ICD-10-CM

## 2020-09-04 LAB — PREGNANCY, URINE: Preg Test, Ur: NEGATIVE

## 2020-09-04 MED ORDER — SODIUM CHLORIDE 0.9 % IV SOLN
Freq: Once | INTRAVENOUS | Status: AC
  Filled 2020-09-04: qty 250

## 2020-09-04 MED ORDER — DIPHENHYDRAMINE HCL 50 MG/ML IJ SOLN
INTRAMUSCULAR | Status: AC
  Filled 2020-09-04: qty 1

## 2020-09-04 MED ORDER — DIPHENHYDRAMINE HCL 50 MG/ML IJ SOLN
25.0000 mg | Freq: Once | INTRAMUSCULAR | Status: AC
Start: 2020-09-05 — End: 2020-09-04
  Administered 2020-09-04: 25 mg via INTRAVENOUS

## 2020-09-04 MED ORDER — SODIUM CHLORIDE 0.9 % IV SOLN
510.0000 mg | INTRAVENOUS | Status: DC
  Administered 2020-09-04: 510 mg via INTRAVENOUS
  Filled 2020-09-04: qty 17

## 2020-09-05 ENCOUNTER — Encounter: Payer: Self-pay | Admitting: Oncology

## 2020-09-06 ENCOUNTER — Encounter: Payer: Self-pay | Admitting: Oncology

## 2020-09-06 ENCOUNTER — Ambulatory Visit
Admission: EM | Admit: 2020-09-06 | Discharge: 2020-09-06 | Disposition: A | Discharge status: Home / Self Care | Payer: PRIVATE HEALTH INSURANCE | Attending: Family Medicine | Admitting: Family Medicine

## 2020-09-06 ENCOUNTER — Other Ambulatory Visit: Payer: Self-pay

## 2020-09-06 ENCOUNTER — Encounter: Payer: Self-pay | Admitting: Emergency Medicine

## 2020-09-06 DIAGNOSIS — F411 Generalized anxiety disorder: Secondary | ICD-10-CM

## 2020-09-06 MED ORDER — HYDROXYZINE HCL 25 MG PO TABS: 25.0000 mg | ORAL_TABLET | Freq: Four times a day (QID) | ORAL | 0 refills | Status: DC

## 2020-09-06 NOTE — ED Triage Notes (Addendum)
Patient c/o chest pain and SOB that started yesterday. Patient states that she has an ongoing cough.  Patient was diagnosed with COVID in June 2022.

## 2020-09-06 NOTE — Discharge Instructions (Addendum)
Continue to hold your Cymbalta.  Use the Hydroxyzine every 6 hours as needed for anxiety symptoms of psychological agitation.  Return for reevaluation for new or worsening symptoms.

## 2020-09-06 NOTE — ED Provider Notes (Signed)
[facility]    CSN: [id number] Arrival date & time: 09/06/20  1529      History   Chief Complaint Chief Complaint  Patient presents with   Chest Pain   Shortness of Breath    HPI Robin Schwartz is a 50 y.o. female.   HPI  50 year old female here for evaluation of intermittent chest pain or shortness of breath that is been going on for 2 days.  She states that she is also had some intermittent rapid heart rate, jaw clenching, and dizziness.  She states that the pain was midsternal but did moved to the left side of her chest.  No other radiation.  She states that it would come on and last for 20 to 30 minutes and then resolved.  She has not know of any provoking or relieving factors.  She did have a recent iron infusion for anemia the day before her symptoms started.  She thought that it might be related to her Cymbalta so she contacted her psychiatrist who told her to stop the Cymbalta.  She had only been taking it for 3 days.  She denies any fainting, sweats, or nausea.  She does report increased agitation as well.  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   Iron deficiency anemia 05/02/2020   Seasonal allergies 06/07/2019   Dysmenorrhea 02/21/2019   MEE (middle ear effusion), bilateral 02/21/2019   Cervical stenosis of spine 06/08/2018   Hand joint pain 06/08/2018   Positive antinuclear antibody 06/08/2018   Anxiety 07/14/2016   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   Chronic abdominal pain 10/10/2014   Headache disorder 12/12/2013   Patellofemoral dysfunction 11/15/2013   Stress incontinence 08/10/2013   Back pain 08/09/2013   Bursitis of both hips 08/09/2013   Other bursitis of hip, unspecified hip 08/09/2013    H/O vitamin D deficiency 06/23/2013   Personal history of other endocrine, nutritional and metabolic disease 84/69/6295   Iliotibial band syndrome 05/31/2013   Irritable bowel syndrome with constipation 04/27/2013   Gastroesophageal reflux disease 04/04/2013   Eustachian tube dysfunction 11/29/2012   Lesion of sciatic nerve 09/27/2012   Piriformis syndrome 09/27/2012   Generalized anxiety disorder 03/28/2012    Past Surgical History:  Procedure Laterality Date   BREAST EXCISIONAL BIOPSY Left 1992   CYSTOSCOPY  2003   LIPOMA EXCISION Left 2007   shoulder    OB History   No obstetric history on file.      Home Medications    Prior to Admission medications   Medication Sig Start Date End Date Taking? Authorizing Provider  hydrOXYzine (ATARAX/VISTARIL) 25 MG tablet Take 1 tablet (25 mg total) by mouth every 6 (six) hours. 09/06/20  Yes Becky Augusta, NP    Family History Family History  Problem Relation Age of Onset   Stroke Mother    COPD Maternal Aunt    Hypertension Maternal Uncle    Asthma Maternal Uncle    COPD Maternal Uncle    Diabetes Maternal Grandmother    Asthma Maternal Grandmother    Cancer Maternal Grandfather    Colon cancer Maternal Grandfather     Social History Social History   Tobacco Use   Smoking status: Never   Smokeless tobacco: Never  Vaping Use   Vaping Use: Never used  Substance Use Topics   Alcohol use: Not Currently   Drug use: Never     Allergies   Contrast media [iodinated diagnostic agents], Hydrocodone, and Propoxyphene   Review of Systems Review of Systems  Constitutional:  Positive for fatigue. Negative for diaphoresis.  Respiratory:  Positive for shortness of breath.   Cardiovascular:  Positive for chest pain and palpitations.  Gastrointestinal:  Negative for nausea and vomiting.  Skin:  Negative for pallor and rash.  Neurological:  Positive for dizziness. Negative for syncope.  Psychiatric/Behavioral:  Positive for  agitation. The patient is nervous/anxious.     Physical Exam Triage Vital Signs ED Triage Vitals  Enc Vitals Group     BP 09/06/20 1534 120/88     Pulse Rate 09/06/20 1534 99     Resp 09/06/20 1534 14     Temp 09/06/20 1534 99.6 F (37.6 C)     Temp Source 09/06/20 1534 Oral     SpO2 09/06/20 1534 100 %     Weight 09/06/20 1530 125 lb (56.7 kg)     Height 09/06/20 1530 5\' 4"  (1.626 m)     Head Circumference --      Peak Flow --      Pain Score 09/06/20 1530 4     Pain Loc --      Pain Edu? --      Excl. in GC? --    No data found.  Updated Vital Signs BP 120/88 (BP Location: Left Arm)   Pulse 99   Temp 99.6 F (37.6 C) (Oral)   Resp 14   Ht 5\' 4"  (1.626 m)   Wt 125 lb (56.7 kg)   SpO2 100%   BMI 21.46 kg/m   Visual Acuity Right Eye Distance:   Left Eye Distance:   Bilateral Distance:    Right Eye Near:   Left Eye Near:    Bilateral Near:     Physical Exam Vitals and nursing note reviewed.  Constitutional:      General: She is not in acute distress.    Appearance: Normal appearance. She is normal weight. She is not ill-appearing.  HENT:     Head: Normocephalic and atraumatic.  Cardiovascular:     Rate and Rhythm: Normal rate and regular rhythm.     Pulses: Normal pulses.     Heart sounds: Normal heart sounds. No murmur heard.   No gallop.  Pulmonary:     Effort: Pulmonary effort is normal.     Breath sounds: Normal breath sounds. No wheezing, rhonchi or rales.  Skin:    General: Skin is warm and dry.     Findings: No erythema or rash.  Neurological:     General: No focal deficit present.     Mental Status: She is alert.  Psychiatric:        Behavior: Behavior normal.        Thought Content: Thought content normal.        Judgment: Judgment normal.     UC Treatments / Results  Labs (all labs ordered are listed, but only abnormal results are displayed) Labs Reviewed - No data to display  EKG   Radiology No results  found.  Procedures Procedures (including critical care time)  Medications Ordered in UC Medications - No data to display  Initial Impression / Assessment and Plan / UC Course  I have reviewed the triage vital signs and the nursing notes.  Pertinent labs & imaging results that were available during my care of the patient were reviewed by me and considered in my medical decision making (see chart for details).  Is a nontoxic-appearing 50 year old female here for evaluation of chest pain, shortness of breath, dizziness, jaw clenching, and rapid heartbeat this been off and on for the past 2 days.  Patient denies any provoking or relieving factors.  They will come on intermittently.  She does have a history of anxiety and was recently started on Cymbalta but stopped after 3 days due to possible reaction to an iron infusion that she was receiving.  Patient ports that she has had some increased agitation for the past 2 days as well and she contact her psychiatrist because she thought she might do something harmful.  She expressed frustration with her dog going for a walk and using the bathroom.  Patient denies any self-harm thoughts or ideation.  Patient's not having chest pain at present.  EKG collected at triage shows normal sinus rhythm with a ventricular rate of 74, PR interval of 156 ms, QRS duration of 72 ms, QT of 362 ms, and QTc of 401 ms this EKG was compared to an EKG from 08/14/2020 and there are no significant changes.  Patient's physical exam reveals S1-S2 heart sounds and lung sounds are clear to auscultation.  Patient has no edema or swelling.  She is not dyspneic and can speak in full sentences.  Suspect patient's symptoms are anxiety related.  Will prescribe patient hydroxyzine to use for symptomatic relief 4 times a day as needed.  I have encouraged her to follow-up with her psychiatrist and restart her Cymbalta as soon as possible.   Final Clinical Impressions(s) / UC Diagnoses   Final  diagnoses:  Anxiety state     Discharge Instructions      Continue to hold your Cymbalta.  Use the Hydroxyzine every 6 hours as needed for anxiety symptoms of psychological agitation.  Return for reevaluation for new or worsening symptoms.      ED Prescriptions     Medication Sig Dispense Auth. Provider   hydrOXYzine (ATARAX/VISTARIL) 25 MG tablet Take 1 tablet (25 mg total) by mouth every 6 (six) hours. 25 tablet Becky Augusta, NP      PDMP not reviewed this encounter.   Becky Augusta, NP 09/06/20 1622

## 2020-09-08 ENCOUNTER — Encounter: Payer: Self-pay | Admitting: Family Medicine

## 2020-09-08 ENCOUNTER — Encounter: Payer: Self-pay | Admitting: Oncology

## 2020-09-09 ENCOUNTER — Encounter: Payer: Self-pay | Admitting: Family Medicine

## 2020-09-09 ENCOUNTER — Encounter: Payer: Self-pay | Admitting: Oncology

## 2020-09-09 ENCOUNTER — Ambulatory Visit (INDEPENDENT_AMBULATORY_CARE_PROVIDER_SITE_OTHER): Payer: Self-pay | Admitting: Family Medicine

## 2020-09-09 ENCOUNTER — Other Ambulatory Visit: Payer: Self-pay

## 2020-09-09 VITALS — BP 106/60 | HR 84 | Temp 99.2°F | Ht 64.0 in | Wt 119.0 lb

## 2020-09-09 DIAGNOSIS — M170 Bilateral primary osteoarthritis of knee: Secondary | ICD-10-CM

## 2020-09-09 DIAGNOSIS — M545 Low back pain, unspecified: Secondary | ICD-10-CM

## 2020-09-09 DIAGNOSIS — R634 Abnormal weight loss: Secondary | ICD-10-CM | POA: Insufficient documentation

## 2020-09-09 NOTE — Patient Instructions (Signed)
-   Start physical therapy with referral provided - Contact our office if knee or back pain prohibits you being able to participate in PT - Obtain CT abdomen / pelvis through [facility] (contact if needing additional information / orders) - Return as previously scheduled, contact for any questions or symptom worsening

## 2020-09-09 NOTE — Progress Notes (Signed)
Primary Care / Sports Medicine Office Visit  Patient Information:  Patient ID: Robin Schwartz, female DOB: 09/02/1970 Age: 50 y.o. MRN: [id number]   Robin Schwartz is a pleasant 50 y.o. 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   Iron deficiency anemia 05/02/2020   Seasonal allergies 06/07/2019   Dysmenorrhea 02/21/2019   MEE (middle ear effusion), bilateral 02/21/2019   Cervical stenosis of spine 06/08/2018   Hand joint pain 06/08/2018   Positive antinuclear antibody 06/08/2018   Anxiety 07/14/2016   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   Chronic abdominal pain 10/10/2014   Headache disorder 12/12/2013   Patellofemoral dysfunction 11/15/2013   Stress incontinence 08/10/2013   Lumbosacral pain 08/09/2013   Bursitis of both hips 08/09/2013   Other bursitis of hip, unspecified hip 08/09/2013   H/O vitamin D deficiency 06/23/2013   Personal history of other endocrine, nutritional and metabolic disease 27/25/3664   Iliotibial band syndrome 05/31/2013   Irritable bowel syndrome with constipation 04/27/2013   Gastroesophageal reflux disease 04/04/2013   Eustachian tube dysfunction 11/29/2012   Lesion of sciatic nerve 09/27/2012   Piriformis syndrome 09/27/2012   Generalized anxiety disorder 03/28/2012   Past Medical History:  Diagnosis Date   Anemia    Anxiety    Outpatient Encounter Medications as of 09/09/2020   Medication Sig   [DISCONTINUED] ciprofloxacin (CILOXAN) 0.3 % ophthalmic solution Place 2 drops into the right eye 4 (four) times daily.   hydrOXYzine (ATARAX/VISTARIL) 25 MG tablet Take 1 tablet (25 mg total) by mouth every 6 (six) hours. (Patient not taking: Reported on 09/09/2020)   No facility-administered encounter medications on file as of 09/09/2020.   Past Surgical History:  Procedure Laterality Date   BREAST EXCISIONAL BIOPSY Left 1992   CYSTOSCOPY  2003   LIPOMA EXCISION Left 2007   shoulder    Vitals:   09/09/20 1426  BP: 106/60  Pulse: 84  Temp: 99.2 F (37.3 C)  SpO2: 99%   Vitals:   09/09/20 1426  Weight: 119 lb (54 kg)  Height: 5\' 4"  (1.626 m)   Body mass index is 20.43 kg/m.  DG Cervical Spine Complete  Result Date: 08/20/2020 CLINICAL DATA:  Acute on chronic neck pain. EXAM: CERVICAL SPINE - COMPLETE 4+ VIEW COMPARISON:  None. FINDINGS: Minimal grade 1 retrolisthesis is noted at C4-5 and C5-6 secondary to moderate degenerative disc disease at these levels. No definite fracture is noted. No prevertebral soft tissue swelling is noted. No significant neural foraminal stenosis is noted. IMPRESSION: Moderate multilevel degenerative disc disease. No acute abnormality seen. Electronically Signed   By: 08/22/2020 M.D.   On: 08/20/2020 09:52   DG Chest Portable 1 View  Result Date: 08/14/2020 CLINICAL DATA:  Chest pain EXAM: PORTABLE CHEST 1 VIEW COMPARISON:  02/15/2020 FINDINGS: The heart size and mediastinal contours are within normal limits. Both lungs are clear. The visualized skeletal structures  are unremarkable. IMPRESSION: No active disease. Electronically Signed   By: Jasmine Pang M.D.   On: 08/14/2020 20:01     Independent interpretation of notes and tests performed by another provider:   None  Procedures performed:   None  Pertinent History, Exam, Impression, and Recommendations:   Bilateral primary osteoarthritis of knee Chronic issue with  current exacerbation, has outside films for which the reports were read.  Physical exam shows focal tenderness along the lateral right joint line, patellar facets, pain with maximal flexion, no ligamentous laxity noted; left knee with maximal medial tibiofemoral tenderness and mild patellofemoral tenderness, pain with maximal flexion, no ligamentous laxity noted.  Patient with symptoms consistent with osteoarthritis related arthralgia of bilateral knees, treatment strategies were outlined, given ongoing GI work-up we have deferred NSAIDs, she will consider intra-articular cortisone, viscosupplementation in the future.  She is amenable to start of formal physical therapy and will contact us if pain in the knees prevents her from progressing.  She can follow-up on as-needed basis for this issue.  Lumbosacral pain Patient with outside x-rays of the lumbar spine, images were unable to be reviewed, report reads scattered degenerative changes.  Her physical findings today reveal focality to the left greater than right SI joint, left greater trochanter, left gluteus medius, deconditioning throughout the hamstrings left greater than right, mild positive FADIR left greater than right.  Negative straight leg raise bilaterally.  I have advised patient on the various treatment strategies and given her findings, physical therapy would be beneficial, can advance to local ultrasound-guided injections if symptoms prevent her from progressing in physical therapy.  Will defer NSAIDs given ongoing GI work-up.  She can follow-up on as-needed basis for this issue.  Weight loss, unintentional Patient stated history of the same, objective record review reveals 10 pound weight loss over the course of 2 months.  CT abdomen pelvis with and without contrast considered and as her imaging is throughout the [facility] system, she has requested this imaging to be performed there and we will coordinate orders through her [facility] system  providers. Weight 58.9 kg (129 lb 13.6 oz) 07/10/2020 1:01 PM EDT     Orders & Medications No orders of the defined types were placed in this encounter.  Orders Placed This Encounter  Procedures   Ambulatory referral to Physical Therapy     Return if symptoms worsen or fail to improve.     Jerrol Banana, MD   Primary Care Sports Medicine [facility]

## 2020-09-09 NOTE — Assessment & Plan Note (Signed)
Patient stated history of the same, objective record review reveals 10 pound weight loss over the course of 2 months.  CT abdomen pelvis with and without contrast considered and as her imaging is throughout the [facility] system, she has requested this imaging to be performed there and we will coordinate orders through her [facility] system providers. Weight 58.9 kg (129 lb 13.6 oz) 07/10/2020 1:01 PM EDT

## 2020-09-09 NOTE — Assessment & Plan Note (Signed)
Chronic issue with current exacerbation, has outside films for which the reports were read.  Physical exam shows focal tenderness along the lateral right joint line, patellar facets, pain with maximal flexion, no ligamentous laxity noted; left knee with maximal medial tibiofemoral tenderness and mild patellofemoral tenderness, pain with maximal flexion, no ligamentous laxity noted.  Patient with symptoms consistent with osteoarthritis related arthralgia of bilateral knees, treatment strategies were outlined, given ongoing GI work-up we have deferred NSAIDs, she will consider intra-articular cortisone, viscosupplementation in the future.  She is amenable to start of formal physical therapy and will contact us if pain in the knees prevents her from progressing.  She can follow-up on as-needed basis for this issue.

## 2020-09-09 NOTE — Assessment & Plan Note (Signed)
Patient with outside x-rays of the lumbar spine, images were unable to be reviewed, report reads scattered degenerative changes.  Her physical findings today reveal focality to the left greater than right SI joint, left greater trochanter, left gluteus medius, deconditioning throughout the hamstrings left greater than right, mild positive FADIR left greater than right.  Negative straight leg raise bilaterally.  I have advised patient on the various treatment strategies and given her findings, physical therapy would be beneficial, can advance to local ultrasound-guided injections if symptoms prevent her from progressing in physical therapy.  Will defer NSAIDs given ongoing GI work-up.  She can follow-up on as-needed basis for this issue.

## 2020-09-10 ENCOUNTER — Encounter: Payer: Self-pay | Admitting: Physical Therapy

## 2020-09-10 ENCOUNTER — Ambulatory Visit: Payer: Self-pay | Attending: Family Medicine | Admitting: Physical Therapy

## 2020-09-10 DIAGNOSIS — M545 Low back pain, unspecified: Secondary | ICD-10-CM | POA: Insufficient documentation

## 2020-09-10 DIAGNOSIS — G8929 Other chronic pain: Secondary | ICD-10-CM | POA: Insufficient documentation

## 2020-09-10 DIAGNOSIS — M542 Cervicalgia: Secondary | ICD-10-CM | POA: Insufficient documentation

## 2020-09-10 DIAGNOSIS — M47812 Spondylosis without myelopathy or radiculopathy, cervical region: Secondary | ICD-10-CM | POA: Insufficient documentation

## 2020-09-10 DIAGNOSIS — M6281 Muscle weakness (generalized): Secondary | ICD-10-CM | POA: Insufficient documentation

## 2020-09-10 NOTE — Telephone Encounter (Signed)
Please advise if you are able to access this.

## 2020-09-10 NOTE — Patient Instructions (Signed)
Access Code [facility]

## 2020-09-11 DIAGNOSIS — Z8616 Personal history of COVID-19: Secondary | ICD-10-CM | POA: Insufficient documentation

## 2020-09-11 DIAGNOSIS — R509 Fever, unspecified: Secondary | ICD-10-CM | POA: Insufficient documentation

## 2020-09-16 ENCOUNTER — Ambulatory Visit: Payer: Self-pay | Admitting: Physical Therapy

## 2020-09-16 ENCOUNTER — Other Ambulatory Visit: Payer: Self-pay

## 2020-09-16 DIAGNOSIS — G8929 Other chronic pain: Secondary | ICD-10-CM

## 2020-09-16 DIAGNOSIS — M545 Low back pain, unspecified: Secondary | ICD-10-CM

## 2020-09-16 DIAGNOSIS — M6281 Muscle weakness (generalized): Secondary | ICD-10-CM

## 2020-09-16 DIAGNOSIS — M542 Cervicalgia: Secondary | ICD-10-CM

## 2020-09-16 DIAGNOSIS — M47812 Spondylosis without myelopathy or radiculopathy, cervical region: Secondary | ICD-10-CM

## 2020-09-18 ENCOUNTER — Other Ambulatory Visit: Payer: Self-pay

## 2020-09-18 ENCOUNTER — Ambulatory Visit: Payer: Self-pay | Admitting: Physical Therapy

## 2020-09-18 DIAGNOSIS — M542 Cervicalgia: Secondary | ICD-10-CM

## 2020-09-18 DIAGNOSIS — M6281 Muscle weakness (generalized): Secondary | ICD-10-CM

## 2020-09-18 DIAGNOSIS — G8929 Other chronic pain: Secondary | ICD-10-CM

## 2020-09-18 DIAGNOSIS — M47812 Spondylosis without myelopathy or radiculopathy, cervical region: Secondary | ICD-10-CM

## 2020-09-18 DIAGNOSIS — M545 Low back pain, unspecified: Secondary | ICD-10-CM

## 2020-09-19 NOTE — Therapy (Addendum)
[place] [facility] [address] [place], [place], [address] Phone: [phone]   Fax:  [phone]  Physical Therapy Treatment  Patient Details  Name: Robin Schwartz MRN: [id number] Date of Birth: 1970/11/28 Referring Provider (PT): Dr. Joseph Berkshire   Encounter Date: 09/18/2020    PT End of Session - 09/19/20 0953     Visit Number 3    Number of Visits 9    Date for PT Re-Evaluation 10/08/20    Authorization - Visit Number 3    Authorization - Number of Visits 10    PT Start Time 0950    PT Stop Time 1044    PT Time Calculation (min) 54 min    Activity Tolerance Patient tolerated treatment well;Patient limited by pain    Behavior During Therapy [facility] for tasks assessed/performed          Past Medical History:  Diagnosis Date   Anemia    Anxiety     Past Surgical History:  Procedure Laterality Date   BREAST EXCISIONAL BIOPSY Left 1992   CYSTOSCOPY  2003   LIPOMA EXCISION Left 2007   shoulder    There were no vitals filed for this visit.   Subjective Assessment - 09/29/20 1209     Subjective Pt. states she has had pain in neck/ L scapular region yesterday.  Pt. reports 4/10 neck/back pain at this time.    Pertinent History See MD notes from [facility].    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 Yes    Pain Score 4     Pain Location Neck    Pain Orientation Right;Left    Pain Type Chronic pain    Pain Onset More than a month ago                  Ther.ex.:   Scifit L4 10 min. B UE/LE (no rest breaks)- forward 5 min./ backwards 5 min.   Supine lumbar rotn./ knee to chest with holds as tolerated 5x each. Supine B shoulder flexion/ horizontal abduction 20x.   Seated blue physioball ex.: lumbar extension/ wt. Shifting/ marching/ posture correction ex./ 2# shoulder flexion  with mirror feedback 10x2 each.  CGA for safety/ cuing  See updated HEP (same access code).    Manual:   Supine cervical AAROM (all planes)- 5x each.   Supine UT/levator stretches 3x each (as tolerated) Prone STM to B UT/upper thoracic/lumbar paraspinals (as tolerated).  Supine hamstring/ piriformis/ lumbar rotn. Stretches 2x each with holds.         PT Long Term Goals - 09/29/20 2836       PT LONG TERM GOAL #1   Title Pt. will increase FOTO to 59 to improve pain-free functional mobility.    Baseline Initial FOTO on 7/20:  51    Time 4    Period Weeks    Status New    Target Date 10/08/20      PT LONG TERM GOAL #2   Title Pt. will increase cervical AROM to [facility] with no increase c/o upper trap./scapular pain to improve mobility.    Baseline Cervical AROM: flexion 45 deg./ extension 35 deg./ L rotn. 50 deg./ R rotn. 43 deg. (pain)/ L lat. flex. 34 deg./ R 28 deg. (increase pain).    Time 4    Period Weeks    Status New  Target Date 10/08/20      PT LONG TERM GOAL #3   Title Pt. 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    Baseline Increase c/o back pain with prolonged tasks.    Time 4    Period Weeks    Status New    Target Date 10/08/20      PT LONG TERM GOAL #4   Title Pt. will increase B UE/ core strength 1/2 muscle grade to improve upright posture/ pain-free mobility.    Baseline B UE strength grossly 4+/5 MMT. Lumbar AROM WFL and B knee AROM WFL    Time 4    Period Weeks    Status New    Target Date 10/08/20                   Plan - 09/29/20 1213     Clinical Impression Statement Pt. reports no sharp pain in cervical/ scapular region during supine manual stretches.  Increase in cervical AROM: rotn./ lateral flexion after manual stretches.  Good progression with supine core ther.ex. and addition of seated ther.ex. on blue physioball.  Good upright posture/ balance on ball but CGA  required for lumbar extension on ball for safety.  See updated HEP/ handouts.    Stability/Clinical Decision Making Evolving/Moderate complexity    Clinical Decision Making Moderate    Rehab Potential Fair    PT Frequency 2x / week    PT Duration 4 weeks    PT Treatment/Interventions ADLs/Self Care Home Management;Aquatic Therapy;Cryotherapy;Moist Heat;Electrical Stimulation;Functional mobility training;Neuromuscular re-education;Balance training;Therapeutic exercise;Therapeutic activities;Patient/family education;Manual techniques;Passive range of motion;Dry needling;Spinal Manipulations    PT Next Visit Plan Reassess cervical/ lumbar AROM.    PT Home Exercise Plan Access Code: [email or web address]    Consulted and Agree with Plan of Care Patient             Patient will benefit from skilled therapeutic intervention in order to improve the following deficits and impairments:  Pain, Postural dysfunction, Impaired flexibility, Decreased strength, Decreased activity tolerance, Decreased range of motion, Improper body mechanics, Difficulty walking, Decreased mobility, Decreased endurance  Visit Diagnosis: Cervical spondylosis  Neck pain  Chronic bilateral low back pain without sciatica  Muscle weakness (generalized)     Problem List Patient Active Problem List   Diagnosis Date Noted   Bilateral primary osteoarthritis of knee 09/09/2020   Weight loss, unintentional 09/09/2020   Cervical spondylosis 09/01/2020   Anemia 08/19/2020   Cervical paraspinous muscle spasm 08/19/2020   Thickened endometrium 08/14/2020   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   Iron deficiency anemia 05/02/2020   Seasonal allergies 06/07/2019   Dysmenorrhea 02/21/2019   MEE (middle ear effusion), bilateral 02/21/2019   Cervical stenosis of spine 06/08/2018   Hand joint pain 06/08/2018   Positive antinuclear antibody 06/08/2018   Anxiety 07/14/2016   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   Chronic abdominal pain  10/10/2014   Headache disorder 12/12/2013   Patellofemoral dysfunction 11/15/2013   Stress incontinence 08/10/2013   Lumbosacral pain 08/09/2013   Bursitis of both hips 08/09/2013   Other bursitis of hip, unspecified hip 08/09/2013   H/O vitamin D deficiency 06/23/2013   Personal history of other endocrine, nutritional and metabolic disease 47/10/6281   Iliotibial band syndrome 05/31/2013   Irritable bowel syndrome with constipation 04/27/2013   Gastroesophageal reflux disease 04/04/2013   Eustachian tube dysfunction 11/29/2012   Lesion of sciatic nerve 09/27/2012   Piriformis syndrome 09/27/2012  Generalized anxiety disorder 03/28/2012   Cammie Mcgee, PT, DPT # [id number] 09/29/2020, 12:28 PM  [place] [facility] [address] [place], [place], [address] Phone: [phone]   Fax:  [phone]  Name: Robin Schwartz MRN: [id number] Date of Birth: 05/28/1970

## 2020-09-19 NOTE — Therapy (Addendum)
[facility] [address] [place], [place], [address] Phone: [phone]   Fax:  [phone]  Physical Therapy Treatment  Patient Details  Name: Robin Schwartz MRN: [id number] Date of Birth: 10/02/1970 Referring Provider (PT): Dr. Joseph Berkshire   Encounter Date: 09/16/2020    PT End of Session - 09/19/20 0951     Visit Number 2    Number of Visits 9    Date for PT Re-Evaluation 10/08/20    Authorization - Visit Number 2    Authorization - Number of Visits 10    PT Start Time 0955    PT Stop Time 1054    PT Time Calculation (min) 59 min    Activity Tolerance Patient tolerated treatment well;Patient limited by pain    Behavior During Therapy [facility] for tasks assessed/performed             Past Medical History:  Diagnosis Date   Anemia    Anxiety     Past Surgical History:  Procedure Laterality Date   BREAST EXCISIONAL BIOPSY Left 1992   CYSTOSCOPY  2003   LIPOMA EXCISION Left 2007   shoulder      Past Medical History:  Diagnosis Date   Anemia    Anxiety     Past Surgical History:  Procedure Laterality Date   BREAST EXCISIONAL BIOPSY Left 1992   CYSTOSCOPY  2003   LIPOMA EXCISION Left 2007   shoulder    There were no vitals filed for this visit.   Subjective Assessment - 09/29/20 0941     Subjective Pt. reports no pain prior to PT tx. session.  Pt. has a couple questions with HEP/ core ex.    Pertinent History See MD notes from [facility].    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 No/denies    Pain Onset More than a month ago               Ther.ex.:  Nustep L3-4 10 min. B UE/LE (no rest breaks).   Reviewed HEP Supine TrA with tactile/verbal cuing for proper activation (5x) Supine marching/ hip abduction with RTB/ bridging 20x each. Supine B shoulder  flexion/ horizontal abduction 20x.    Manual:  Supine cervical AAROM (all planes)- 5x each.  Supine UT/levator stretches 3x each (as tolerated) Prone STM to B UT/upper thoracic/lumbar paraspinals (as tolerated).  Good mobility with tenderness reported.          PT Long Term Goals - 09/29/20 5465       PT LONG TERM GOAL #1   Title Pt. will increase FOTO to 59 to improve pain-free functional mobility.    Baseline Initial FOTO on 7/20:  51    Time 4    Period Weeks    Status New    Target Date 10/08/20      PT LONG TERM GOAL #2   Title Pt. will increase cervical AROM to [facility] with no increase c/o upper trap./scapular pain to improve mobility.    Baseline Cervical AROM: flexion 45 deg./ extension 35 deg./ L rotn. 50 deg./ R rotn. 43 deg. (pain)/ L lat. flex. 34 deg./ R 28 deg. (increase pain).    Time 4    Period Weeks    Status New    Target Date 10/08/20      PT LONG TERM GOAL #3   Title  Pt. 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    Baseline Increase c/o back pain with prolonged tasks.    Time 4    Period Weeks    Status New    Target Date 10/08/20      PT LONG TERM GOAL #4   Title Pt. will increase B UE/ core strength 1/2 muscle grade to improve upright posture/ pain-free mobility.    Baseline B UE strength grossly 4+/5 MMT. Lumbar AROM WFL and B knee AROM WFL    Time 4    Period Weeks    Status New    Target Date 10/08/20                   Plan - 09/29/20 1013     Clinical Impression Statement PT tx. session focused on progressing core stability program/ improving upright posture/ manual tx. to cervical spine.  Pt. had 1 episode sharp L scapular pain during cervical rotn./ UT stretches in supine position.  Cuing to correct TrA muscle activiation in supine position and progress HEP.  Pt. continues to c/o R medial knee pain with palpation/ position in sidelying.    Stability/Clinical  Decision Making Evolving/Moderate complexity    Clinical Decision Making Moderate    Rehab Potential Fair    PT Frequency 2x / week    PT Duration 4 weeks    PT Treatment/Interventions ADLs/Self Care Home Management;Aquatic Therapy;Cryotherapy;Moist Heat;Electrical Stimulation;Functional mobility training;Neuromuscular re-education;Balance training;Therapeutic exercise;Therapeutic activities;Patient/family education;Manual techniques;Passive range of motion;Dry needling;Spinal Manipulations    PT Next Visit Plan Progress HEP    PT Home Exercise Plan Access Code: [email or web address]    Consulted and Agree with Plan of Care Patient             Patient will benefit from skilled therapeutic intervention in order to improve the following deficits and impairments:  Pain, Postural dysfunction, Impaired flexibility, Decreased strength, Decreased activity tolerance, Decreased range of motion, Improper body mechanics, Difficulty walking, Decreased mobility, Decreased endurance  Visit Diagnosis: Cervical spondylosis  Neck pain  Chronic bilateral low back pain without sciatica  Muscle weakness (generalized)     Problem List Patient Active Problem List   Diagnosis Date Noted   Bilateral primary osteoarthritis of knee 09/09/2020   Weight loss, unintentional 09/09/2020   Cervical spondylosis 09/01/2020   Anemia 08/19/2020   Cervical paraspinous muscle spasm 08/19/2020   Thickened endometrium 08/14/2020   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   Iron deficiency anemia 05/02/2020   Seasonal allergies 06/07/2019   Dysmenorrhea 02/21/2019   MEE (middle ear effusion), bilateral 02/21/2019   Cervical stenosis of spine 06/08/2018   Hand joint pain 06/08/2018   Positive antinuclear antibody 06/08/2018   Anxiety 07/14/2016   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   Chronic abdominal pain 10/10/2014   Headache disorder 12/12/2013   Patellofemoral dysfunction 11/15/2013   Stress incontinence 08/10/2013    Lumbosacral pain 08/09/2013   Bursitis of both hips 08/09/2013   Other bursitis of hip, unspecified hip 08/09/2013   H/O vitamin D deficiency 06/23/2013   Personal history of other endocrine, nutritional and metabolic disease 77/41/2878   Iliotibial band syndrome 05/31/2013   Irritable bowel syndrome with constipation 04/27/2013   Gastroesophageal reflux disease 04/04/2013   Eustachian tube dysfunction 11/29/2012   Lesion of sciatic nerve 09/27/2012   Piriformis syndrome 09/27/2012   Generalized anxiety disorder 03/28/2012   Robin Schwartz, PT, DPT # [id number] 09/29/2020, 10:24 AM  [facility] [address] [place], [place], [address] Phone: [phone]   Fax:  [phone]  Name: Robin Schwartz MRN: [id number] Date of Birth: September 06, 1970
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
## 2020-09-22 ENCOUNTER — Encounter: Payer: Self-pay | Admitting: Family Medicine

## 2020-09-22 ENCOUNTER — Other Ambulatory Visit: Payer: Self-pay

## 2020-09-22 DIAGNOSIS — R42 Dizziness and giddiness: Secondary | ICD-10-CM

## 2020-09-22 DIAGNOSIS — R519 Headache, unspecified: Secondary | ICD-10-CM

## 2020-09-22 NOTE — Telephone Encounter (Signed)
Please review.  KP

## 2020-09-23 ENCOUNTER — Other Ambulatory Visit: Payer: Self-pay

## 2020-09-23 ENCOUNTER — Ambulatory Visit: Payer: Self-pay | Attending: Family Medicine | Admitting: Physical Therapy

## 2020-09-23 DIAGNOSIS — G8929 Other chronic pain: Secondary | ICD-10-CM | POA: Insufficient documentation

## 2020-09-23 DIAGNOSIS — M47812 Spondylosis without myelopathy or radiculopathy, cervical region: Secondary | ICD-10-CM | POA: Insufficient documentation

## 2020-09-23 DIAGNOSIS — M6281 Muscle weakness (generalized): Secondary | ICD-10-CM | POA: Insufficient documentation

## 2020-09-23 DIAGNOSIS — M545 Low back pain, unspecified: Secondary | ICD-10-CM | POA: Insufficient documentation

## 2020-09-23 DIAGNOSIS — M542 Cervicalgia: Secondary | ICD-10-CM | POA: Insufficient documentation

## 2020-09-25 ENCOUNTER — Other Ambulatory Visit: Payer: Self-pay

## 2020-09-25 ENCOUNTER — Ambulatory Visit: Payer: Self-pay | Admitting: Physical Therapy

## 2020-09-25 DIAGNOSIS — M545 Low back pain, unspecified: Secondary | ICD-10-CM

## 2020-09-25 DIAGNOSIS — G8929 Other chronic pain: Secondary | ICD-10-CM

## 2020-09-25 DIAGNOSIS — M542 Cervicalgia: Secondary | ICD-10-CM

## 2020-09-25 DIAGNOSIS — M6281 Muscle weakness (generalized): Secondary | ICD-10-CM

## 2020-09-25 DIAGNOSIS — M47812 Spondylosis without myelopathy or radiculopathy, cervical region: Secondary | ICD-10-CM

## 2020-09-29 ENCOUNTER — Encounter: Payer: Self-pay | Admitting: Physical Therapy

## 2020-09-29 NOTE — Therapy (Signed)
[facility] [address] [place], [place], [address] Phone: [phone]   Fax:  [phone]  Physical Therapy Treatment  Patient Details  Name: Robin Schwartz MRN: [id number] Date of Birth: [birth year] Referring Provider (PT): Dr. Joseph Berkshire   Encounter Date: 09/25/2020   PT End of Session - 09/29/20 1505     Visit Number 5    Number of Visits 9    Date for PT Re-Evaluation 10/08/20    Authorization - Visit Number 5    Authorization - Number of Visits 10    PT Start Time 0949    PT Stop Time 1036    PT Time Calculation (min) 47 min    Activity Tolerance Patient tolerated treatment well;Patient limited by pain    Behavior During Therapy [facility] for tasks assessed/performed             Past Medical History:  Diagnosis Date   Anemia    Anxiety     Past Surgical History:  Procedure Laterality Date   BREAST EXCISIONAL BIOPSY Left 1992   CYSTOSCOPY  2003   LIPOMA EXCISION Left 2007   shoulder    There were no vitals filed for this visit.   Subjective Assessment - 09/29/20 1503     Subjective Pt. states she has been busy this week and reports no new symptoms.  R medial knee pain and tenderness remains.    Pertinent History See MD notes from [facility]    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 No/denies    Pain Onset More than a month ago              Ther.ex.:  Scifit L7 5 min. Forward/ backwards (B UE/LE)- discussed activity this week Seated blue ball: 2# shoulder flexion/ press-ups (fatigue), GTB scap. Retaction 20x R knee assessment (good mobility/ stability) as compared to L  Supine bridging/ hip abduction and flexion with GTB 20x. Discussed HEP  Manual tx.:  Supine LE/lumbar stretches (generalized)- 8 min. Supine UT/levator/ traction to cervical spine in supine  3x each.  No radicular symptoms.      PT Long Term Goals - 09/29/20 0034       PT LONG TERM GOAL #1   Title Pt. will increase FOTO to 59 to improve pain-free functional mobility.    Baseline Initial FOTO on 7/20:  51    Time 4    Period Weeks    Status New    Target Date 10/08/20      PT LONG TERM GOAL #2   Title Pt. will increase cervical AROM to [facility] with no increase c/o upper trap./scapular pain to improve mobility.    Baseline Cervical AROM: flexion 45 deg./ extension 35 deg./ L rotn. 50 deg./ R rotn. 43 deg. (pain)/ L lat. flex. 34 deg./ R 28 deg. (increase pain).    Time 4    Period Weeks    Status New    Target Date 10/08/20      PT LONG TERM GOAL #3   Title Pt. 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    Baseline Increase c/o back pain with prolonged tasks.    Time 4    Period Weeks    Status New    Target Date 10/08/20      PT  LONG TERM GOAL #4   Title Pt. will increase B UE/ core strength 1/2 muscle grade to improve upright posture/ pain-free mobility.    Baseline B UE strength grossly 4+/5 MMT. Lumbar AROM WFL and B knee AROM WFL    Time 4    Period Weeks    Status New    Target Date 10/08/20                   Plan - 09/29/20 1522     Clinical Impression Statement PT tx. session focused on generalized strengthening/ increasing cervical ROM and upright posture.  Pt. more independent with seated ball ex. with marked fatigue/ discomfort with resisted shoulder flexion/ press-ups.  Good upright posture/ head position.  Marked increase in cervical rotn./ lateral flexion during supine manual tx.  No increase c/o L scapular pain during manual tx.  Pt. tolerates addition of Hypervolt to thoracic/lumbar paraspinals in sidelying/ prone position.  PT cleared R knee.  Good R knee ROM/ mobility and strength grossly 5/5 MMT.    Stability/Clinical Decision Making Evolving/Moderate complexity     Clinical Decision Making Moderate    Rehab Potential Fair    PT Frequency 2x / week    PT Duration 4 weeks    PT Treatment/Interventions ADLs/Self Care Home Management;Aquatic Therapy;Cryotherapy;Moist Heat;Electrical Stimulation;Functional mobility training;Neuromuscular re-education;Balance training;Therapeutic exercise;Therapeutic activities;Patient/family education;Manual techniques;Passive range of motion;Dry needling;Spinal Manipulations    PT Next Visit Plan Progress ther.ex.    PT Home Exercise Plan Access Code: [email or web address]    Consulted and Agree with Plan of Care Patient             Patient will benefit from skilled therapeutic intervention in order to improve the following deficits and impairments:  Pain, Postural dysfunction, Impaired flexibility, Decreased strength, Decreased activity tolerance, Decreased range of motion, Improper body mechanics, Difficulty walking, Decreased mobility, Decreased endurance  Visit Diagnosis: Cervical spondylosis  Neck pain  Chronic bilateral low back pain without sciatica  Muscle weakness (generalized)     Problem List Patient Active Problem List   Diagnosis Date Noted   Bilateral primary osteoarthritis of knee 09/09/2020   Weight loss, unintentional 09/09/2020   Cervical spondylosis 09/01/2020   Anemia 08/19/2020   Cervical paraspinous muscle spasm 08/19/2020   Thickened endometrium 08/14/2020   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   Iron deficiency anemia 05/02/2020   Seasonal allergies 06/07/2019   Dysmenorrhea 02/21/2019   MEE (middle ear effusion), bilateral 02/21/2019   Cervical stenosis of spine 06/08/2018   Hand joint pain 06/08/2018   Positive antinuclear antibody 06/08/2018   Anxiety 07/14/2016   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   Chronic abdominal pain 10/10/2014   Headache disorder 12/12/2013   Patellofemoral dysfunction 11/15/2013   Stress incontinence 08/10/2013   Lumbosacral pain 08/09/2013   Bursitis of both  hips 08/09/2013   Other bursitis of hip, unspecified hip 08/09/2013   H/O vitamin D deficiency 06/23/2013   Personal history of other endocrine, nutritional and metabolic disease 12/45/8099   Iliotibial band syndrome 05/31/2013   Irritable bowel syndrome with constipation 04/27/2013   Gastroesophageal reflux disease 04/04/2013   Eustachian tube dysfunction 11/29/2012   Lesion of sciatic nerve 09/27/2012   Piriformis syndrome 09/27/2012   Generalized anxiety disorder 03/28/2012   Cammie Mcgee, PT, DPT # [phone] 09/29/2020, 6:58 PM   [facility] [address] [place], [place], [address] Phone: [phone]   Fax:  [phone]  Name: Delrose Rohwer MRN: [id number] Date of Birth: 04/09/1970

## 2020-09-29 NOTE — Addendum Note (Signed)
Addended by: Cammie Mcgee on: 09/29/2020 09:15 AM   Modules accepted: Orders

## 2020-09-29 NOTE — Therapy (Signed)
[facility] [address]. [place], [place], [address] Phone: [phone]   Fax:  [phone]  Physical Therapy Treatment  Patient Details  Name: Lora Chavers MRN: [id number] Date of Birth: 08-18-1970 Referring Provider (PT): Dr. Joseph Berkshire   Encounter Date: 09/23/2020   PT End of Session - 09/29/20 1245     Visit Number 4    Number of Visits 9    Date for PT Re-Evaluation 10/08/20    Authorization - Visit Number 4    Authorization - Number of Visits 10    PT Start Time 0947    PT Stop Time 1036    PT Time Calculation (min) 49 min    Activity Tolerance Patient tolerated treatment well;Patient limited by pain    Behavior During Therapy [facility] for tasks assessed/performed             Past Medical History:  Diagnosis Date   Anemia    Anxiety     Past Surgical History:  Procedure Laterality Date   BREAST EXCISIONAL BIOPSY Left 1992   CYSTOSCOPY  2003   LIPOMA EXCISION Left 2007   shoulder    There were no vitals filed for this visit.   Subjective Assessment - 09/29/20 1242     Subjective Pt. reports no cervical pain prior to tx. session but states she did HEP over the weekend.  Pt. continues to have R medial knee soreness while sleeping at night (instructed in pillow use between knees).    Pertinent History See MD notes from [facility].    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 No/denies    Pain Onset More than a month ago              Manual tx.:  Supine cervical traction 3x with static holds as tolerate.  Cuing to relax. Supine UT/levator stretches 3x each with hold and cervical rotn. AAROM.   R sidelying L scapular/ UT STM.    There.ex.:  Standing pec stretches/ supine B shoulder horizontal abduction (pec stretches with holds). Supine TrA marching/ bridging/ hip  abduction with GTB 20x each.   Scifit L6 10 min. B UE/LE (5 min. F/b).   Walking in clinic with assessment of gait pattern/ posture     PT Long Term Goals - 09/29/20 3474       PT LONG TERM GOAL #1   Title Pt. will increase FOTO to 59 to improve pain-free functional mobility.    Baseline Initial FOTO on 7/20:  51    Time 4    Period Weeks    Status New    Target Date 10/08/20      PT LONG TERM GOAL #2   Title Pt. will increase cervical AROM to [facility] with no increase c/o upper trap./scapular pain to improve mobility.    Baseline Cervical AROM: flexion 45 deg./ extension 35 deg./ L rotn. 50 deg./ R rotn. 43 deg. (pain)/ L lat. flex. 34 deg./ R 28 deg. (increase pain).    Time 4    Period Weeks    Status New    Target Date 10/08/20      PT LONG TERM GOAL #3   Title Pt. 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    Baseline Increase c/o back pain with prolonged tasks.  Time 4    Period Weeks    Status New    Target Date 10/08/20      PT LONG TERM GOAL #4   Title Pt. will increase B UE/ core strength 1/2 muscle grade to improve upright posture/ pain-free mobility.    Baseline B UE strength grossly 4+/5 MMT. Lumbar AROM WFL and B knee AROM WFL    Time 4    Period Weeks    Status New    Target Date 10/08/20                   Plan - 09/29/20 1245     Clinical Impression Statement No radicular LE symptoms noted during supine core ex./ LE and lumbar stretches.  Good pelvic and B LE symmetry/ alignment noted in supine position before/after stretches.  Minimal to no cuing with core ex. program.  B UE/ shoulder muscle fatigue and discomfort with seated overhead reaching 2-3# dumbbells.    Stability/Clinical Decision Making Evolving/Moderate complexity    Clinical Decision Making Moderate    Rehab Potential Fair    PT Frequency 2x / week    PT Duration 4 weeks    PT Treatment/Interventions ADLs/Self  Care Home Management;Aquatic Therapy;Cryotherapy;Moist Heat;Electrical Stimulation;Functional mobility training;Neuromuscular re-education;Balance training;Therapeutic exercise;Therapeutic activities;Patient/family education;Manual techniques;Passive range of motion;Dry needling;Spinal Manipulations    PT Next Visit Plan Check R medial knee pain/ joint mobility.    PT Home Exercise Plan Access Code: [email or web address]    Consulted and Agree with Plan of Care Patient             Patient will benefit from skilled therapeutic intervention in order to improve the following deficits and impairments:  Pain, Postural dysfunction, Impaired flexibility, Decreased strength, Decreased activity tolerance, Decreased range of motion, Improper body mechanics, Difficulty walking, Decreased mobility, Decreased endurance  Visit Diagnosis: Cervical spondylosis  Neck pain  Chronic bilateral low back pain without sciatica  Muscle weakness (generalized)     Problem List Patient Active Problem List   Diagnosis Date Noted   Bilateral primary osteoarthritis of knee 09/09/2020   Weight loss, unintentional 09/09/2020   Cervical spondylosis 09/01/2020   Anemia 08/19/2020   Cervical paraspinous muscle spasm 08/19/2020   Thickened endometrium 08/14/2020   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   Iron deficiency anemia 05/02/2020   Seasonal allergies 06/07/2019   Dysmenorrhea 02/21/2019   MEE (middle ear effusion), bilateral 02/21/2019   Cervical stenosis of spine 06/08/2018   Hand joint pain 06/08/2018   Positive antinuclear antibody 06/08/2018   Anxiety 07/14/2016   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   Chronic abdominal pain 10/10/2014   Headache disorder 12/12/2013   Patellofemoral dysfunction 11/15/2013   Stress incontinence 08/10/2013   Lumbosacral pain 08/09/2013   Bursitis of both hips 08/09/2013   Other bursitis of hip, unspecified hip 08/09/2013   H/O vitamin D deficiency 06/23/2013   Personal history  of other endocrine, nutritional and metabolic disease 22/63/3354   Iliotibial band syndrome 05/31/2013   Irritable bowel syndrome with constipation 04/27/2013   Gastroesophageal reflux disease 04/04/2013   Eustachian tube dysfunction 11/29/2012   Lesion of sciatic nerve 09/27/2012   Piriformis syndrome 09/27/2012   Generalized anxiety disorder 03/28/2012   Cammie Mcgee, PT, DPT # [phone] 09/29/2020, 2:39 PM  [facility] [address] [place], [place], [address] Phone: [phone]   Fax:  [phone]  Name: Annamay Laymon MRN: [id number] Date of Birth: 1971-01-19

## 2020-09-30 ENCOUNTER — Ambulatory Visit: Payer: Self-pay | Admitting: Physical Therapy

## 2020-09-30 ENCOUNTER — Other Ambulatory Visit: Payer: Self-pay

## 2020-09-30 DIAGNOSIS — G8929 Other chronic pain: Secondary | ICD-10-CM

## 2020-09-30 DIAGNOSIS — M542 Cervicalgia: Secondary | ICD-10-CM

## 2020-09-30 DIAGNOSIS — M47812 Spondylosis without myelopathy or radiculopathy, cervical region: Secondary | ICD-10-CM

## 2020-09-30 DIAGNOSIS — M6281 Muscle weakness (generalized): Secondary | ICD-10-CM

## 2020-10-02 ENCOUNTER — Encounter: Payer: PRIVATE HEALTH INSURANCE | Admitting: Physical Therapy

## 2020-10-02 ENCOUNTER — Encounter: Payer: Self-pay | Admitting: Oncology

## 2020-10-02 DIAGNOSIS — R5382 Chronic fatigue, unspecified: Secondary | ICD-10-CM | POA: Insufficient documentation

## 2020-10-02 DIAGNOSIS — Z6820 Body mass index (BMI) 20.0-20.9, adult: Secondary | ICD-10-CM | POA: Insufficient documentation

## 2020-10-04 NOTE — Therapy (Addendum)
[facility] [address] [place], [place], [address] Phone: [phone]   Fax:  [phone]  Physical Therapy Treatment  Patient Details  Name: Robin Schwartz MRN: [id number] Date of Birth: 1970/05/09 Referring Provider (PT): Dr. Joseph Berkshire   Encounter Date: 09/30/2020    PT End of Session - 10/04/20 1314     Visit Number 6    Number of Visits 9    Date for PT Re-Evaluation 10/08/20    Authorization - Visit Number 6    Authorization - Number of Visits 10    PT Start Time 0955    PT Stop Time 1033    PT Time Calculation (min) 38 min    Activity Tolerance Patient tolerated treatment well;Patient limited by pain    Behavior During Therapy [facility] for tasks assessed/performed               Past Medical History:  Diagnosis Date   Anemia    Anxiety     Past Surgical History:  Procedure Laterality Date   BREAST EXCISIONAL BIOPSY Left 1992   CYSTOSCOPY  2003   LIPOMA EXCISION Left 2007   shoulder    There were no vitals filed for this visit.   Subjective Assessment - 10/07/20 0756     Subjective Pt. reports no new complaints.  R medial knee pain and tenderness remains and c/o increase neck discomfort with prolonged activity.  Discussed weekend activities and sleeping position.    Pertinent History See MD notes from [facility].    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 No/denies    Pain Onset More than a month ago              Ther.ex.:   Scifit L7 5 min. Forward/ backwards (B UE/LE).  No increase c/o pain.   Supine 2# B sh. Press-ups/ shoulder flexion/ horizontal abduction/ adduction 20x. Standing RTB ex. Program (20x each)- scap. Retraction/ sh. Extension/ ER Reviewed HEP   Manual tx.:   Supine LE/lumbar stretches (generalized)- 8 min. Supine UT/levator/ traction to  cervical spine in supine 3x each.  No radicular symptoms.        PT Long Term Goals - 09/29/20 5361       PT LONG TERM GOAL #1   Title Pt. will increase FOTO to 59 to improve pain-free functional mobility.    Baseline Initial FOTO on 7/20:  51    Time 4    Period Weeks    Status New    Target Date 10/08/20      PT LONG TERM GOAL #2   Title Pt. will increase cervical AROM to [facility] with no increase c/o upper trap./scapular pain to improve mobility.    Baseline Cervical AROM: flexion 45 deg./ extension 35 deg./ L rotn. 50 deg./ R rotn. 43 deg. (pain)/ L lat. flex. 34 deg./ R 28 deg. (increase pain).    Time 4    Period Weeks    Status New    Target Date 10/08/20      PT LONG TERM GOAL #3   Title Pt. 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    Baseline Increase c/o back pain with prolonged tasks.    Time 4    Period Weeks    Status New    Target  Date 10/08/20      PT LONG TERM GOAL #4   Title Pt. will increase B UE/ core strength 1/2 muscle grade to improve upright posture/ pain-free mobility.    Baseline B UE strength grossly 4+/5 MMT. Lumbar AROM WFL and B knee AROM WFL    Time 4    Period Weeks    Status New    Target Date 10/08/20                Plan - 10/07/20 0758     Clinical Impression Statement Tx. shortened today due to another appt.  PT tx. focused B UE muscle strengthening in supine/ standing posture.  Pt. reports generalized discomfort in B shoulder/ knees during tx. session.  Full B shoulder/ LE AROM noted.  Pt. c/o generalized muscle weakness/ fatigue t/o tx. PT instructed pt. to continue with current HEP and progress sets/reps as tolerated.    Stability/Clinical Decision Making Evolving/Moderate complexity    Clinical Decision Making Moderate    Rehab Potential Fair    PT Frequency 2x / week    PT Duration 4 weeks    PT Treatment/Interventions ADLs/Self Care Home  Management;Aquatic Therapy;Cryotherapy;Moist Heat;Electrical Stimulation;Functional mobility training;Neuromuscular re-education;Balance training;Therapeutic exercise;Therapeutic activities;Patient/family education;Manual techniques;Passive range of motion;Dry needling;Spinal Manipulations    PT Next Visit Plan Discuss Cardiologist appt./ weekend activities.    PT Home Exercise Plan Access Code: [email or web address]    Consulted and Agree with Plan of Care Patient             Patient will benefit from skilled therapeutic intervention in order to improve the following deficits and impairments:  Pain, Postural dysfunction, Impaired flexibility, Decreased strength, Decreased activity tolerance, Decreased range of motion, Improper body mechanics, Difficulty walking, Decreased mobility, Decreased endurance  Visit Diagnosis: Cervical spondylosis  Neck pain  Chronic bilateral low back pain without sciatica  Muscle weakness (generalized)     Problem List Patient Active Problem List   Diagnosis Date Noted   Bilateral primary osteoarthritis of knee 09/09/2020   Weight loss, unintentional 09/09/2020   Cervical spondylosis 09/01/2020   Anemia 08/19/2020   Cervical paraspinous muscle spasm 08/19/2020   Thickened endometrium 08/14/2020   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   Iron deficiency anemia 05/02/2020   Seasonal allergies 06/07/2019   Dysmenorrhea 02/21/2019   MEE (middle ear effusion), bilateral 02/21/2019   Cervical stenosis of spine 06/08/2018   Hand joint pain 06/08/2018   Positive antinuclear antibody 06/08/2018   Anxiety 07/14/2016   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   Chronic abdominal pain 10/10/2014   Headache disorder 12/12/2013   Patellofemoral dysfunction 11/15/2013   Stress incontinence 08/10/2013   Lumbosacral pain 08/09/2013   Bursitis of both hips 08/09/2013   Other bursitis of hip, unspecified hip 08/09/2013   H/O vitamin D deficiency 06/23/2013   Personal history of  other endocrine, nutritional and metabolic disease 54/62/7035   Iliotibial band syndrome 05/31/2013   Irritable bowel syndrome with constipation 04/27/2013   Gastroesophageal reflux disease 04/04/2013   Eustachian tube dysfunction 11/29/2012   Lesion of sciatic nerve 09/27/2012   Piriformis syndrome 09/27/2012   Generalized anxiety disorder 03/28/2012   Cammie Mcgee, PT, DPT # [id number] 10/07/2020, 2:30 PM  [place] [place] [facility] [address] [place], [place], [address] Phone: [phone]   Fax:  [phone]  Name: Robin Schwartz MRN: [id number] Date of Birth: 1970-09-22

## 2020-10-07 ENCOUNTER — Ambulatory Visit: Payer: Self-pay | Admitting: Physical Therapy

## 2020-10-07 ENCOUNTER — Inpatient Hospital Stay: Payer: Self-pay | Attending: Internal Medicine

## 2020-10-07 ENCOUNTER — Other Ambulatory Visit: Payer: Self-pay

## 2020-10-07 ENCOUNTER — Encounter: Payer: Self-pay | Admitting: Physical Therapy

## 2020-10-07 DIAGNOSIS — M47812 Spondylosis without myelopathy or radiculopathy, cervical region: Secondary | ICD-10-CM

## 2020-10-07 DIAGNOSIS — Z8639 Personal history of other endocrine, nutritional and metabolic disease: Secondary | ICD-10-CM

## 2020-10-07 DIAGNOSIS — D509 Iron deficiency anemia, unspecified: Secondary | ICD-10-CM | POA: Insufficient documentation

## 2020-10-07 DIAGNOSIS — Z79899 Other long term (current) drug therapy: Secondary | ICD-10-CM | POA: Insufficient documentation

## 2020-10-07 DIAGNOSIS — G8929 Other chronic pain: Secondary | ICD-10-CM

## 2020-10-07 DIAGNOSIS — M6281 Muscle weakness (generalized): Secondary | ICD-10-CM

## 2020-10-07 DIAGNOSIS — M545 Other chronic pain: Secondary | ICD-10-CM

## 2020-10-07 DIAGNOSIS — M542 Cervicalgia: Secondary | ICD-10-CM

## 2020-10-07 LAB — CBC WITH DIFFERENTIAL/PLATELET
Abs Immature Granulocytes: 0.01 10*3/uL (ref 0.00–0.07)
Basophils Absolute: 0.1 10*3/uL (ref 0.0–0.1)
Basophils Relative: 2 %
Eosinophils Absolute: 0.4 10*3/uL (ref 0.0–0.5)
Eosinophils Relative: 7 %
HCT: 40.2 % (ref 36.0–46.0)
Hemoglobin: 12.6 g/dL (ref 12.0–15.0)
Immature Granulocytes: 0 %
Lymphocytes Relative: 41 %
Lymphs Abs: 2.2 10*3/uL (ref 0.7–4.0)
MCH: 25.4 pg — ABNORMAL LOW (ref 26.0–34.0)
MCHC: 31.3 g/dL (ref 30.0–36.0)
MCV: 81 fL (ref 80.0–100.0)
Monocytes Absolute: 0.5 10*3/uL (ref 0.1–1.0)
Monocytes Relative: 9 %
Neutro Abs: 2.2 10*3/uL (ref 1.7–7.7)
Neutrophils Relative %: 41 %
Platelets: 280 10*3/uL (ref 150–400)
RBC: 4.96 MIL/uL (ref 3.87–5.11)
RDW: 17.1 % — ABNORMAL HIGH (ref 11.5–15.5)
WBC: 5.4 10*3/uL (ref 4.0–10.5)
nRBC: 0 % (ref 0.0–0.2)

## 2020-10-07 LAB — FERRITIN: Ferritin: 289 ng/mL (ref 11–307)

## 2020-10-07 LAB — IRON AND TIBC
Iron: 112 ug/dL (ref 28–170)
Saturation Ratios: 38 % — ABNORMAL HIGH (ref 10.4–31.8)
TIBC: 298 ug/dL (ref 250–450)
UIBC: 186 ug/dL

## 2020-10-08 ENCOUNTER — Encounter: Payer: Self-pay | Admitting: Oncology

## 2020-10-08 ENCOUNTER — Inpatient Hospital Stay (HOSPITAL_BASED_OUTPATIENT_CLINIC_OR_DEPARTMENT_OTHER): Payer: Self-pay | Admitting: Oncology

## 2020-10-08 ENCOUNTER — Other Ambulatory Visit: Payer: PRIVATE HEALTH INSURANCE

## 2020-10-08 DIAGNOSIS — D508 Other iron deficiency anemias: Secondary | ICD-10-CM

## 2020-10-08 NOTE — Therapy (Signed)
[facility] [address] [place], [place], [address] Phone: [phone]   Fax:  [phone]  Physical Therapy Treatment  Patient Details  Name: Robin Schwartz MRN: [id number] Date of Birth: [birth year] Referring Provider (PT): Dr. Joseph Berkshire   Encounter Date: 10/07/2020   PT End of Session - 10/08/20 0937     Visit Number 7    Number of Visits 9    Date for PT Re-Evaluation 10/08/20    Authorization - Visit Number 7    Authorization - Number of Visits 10    PT Start Time [phone]    PT Stop Time 1046    PT Time Calculation (min) 53 min    Activity Tolerance Patient tolerated treatment well;Patient limited by pain    Behavior During Therapy [facility] for tasks assessed/performed             Past Medical History:  Diagnosis Date   Anemia    Anxiety     Past Surgical History:  Procedure Laterality Date   BREAST EXCISIONAL BIOPSY Left 1992   CYSTOSCOPY  2003   LIPOMA EXCISION Left 2007   shoulder    There were no vitals filed for this visit.   Subjective Assessment - 10/07/20 1520     Subjective Pt. states she has had a couple days of increase pain in multiple joints (shoulders/elbows/knees).  Pt. c/o tingling and numbness sensations in face and headaches.  Pt. reports poor compliance with HEP due to being busy.    Pertinent History See MD notes from [facility].    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 Yes    Pain Score 5     Pain Location Neck    Pain Orientation Right;Left    Pain Descriptors / Indicators Constant    Pain Type Chronic pain    Pain Onset More than a month ago    Pain Score 5    Pain Location Back    Pain Orientation Lower             Discussed increase in pain symptoms over past several days and onset of facial tingling/ numbness.   Manual  tx.:  Seated/supine cervical AROM all planes (reassessment of chin tucks) Supine L/R UT and levator manual stretches with static holds 4x30 sec.  Supine L/R cervical rotn. Stretches tolerable end-range 3x each. (No scapular pain today) Reassessment of L/R shoulder capsule mobility (all planes). Supine L/R hamstring/ piriformis/ rotn. Stretches 3x each.    There.ex.:  No Scifit today Supine B shoulder flexion/ horizontal abduction AROM (no resistance).   Reviewed HEP    PT Long Term Goals - 09/29/20 9470       PT LONG TERM GOAL #1   Title Pt. will increase FOTO to 59 to improve pain-free functional mobility.    Baseline Initial FOTO on 7/20:  51    Time 4    Period Weeks    Status New    Target Date 10/08/20      PT LONG TERM GOAL #2   Title Pt. will increase cervical AROM to [facility] with no increase c/o upper trap./scapular pain to improve mobility.    Baseline Cervical AROM: flexion 45 deg./ extension 35 deg./ L rotn. 50 deg./ R rotn. 43 deg. (pain)/ L lat. flex. 34 deg./ R 28 deg. (increase pain).    Time 4  Period Weeks    Status New    Target Date 10/08/20      PT LONG TERM GOAL #3   Title Pt. 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    Baseline Increase c/o back pain with prolonged tasks.    Time 4    Period Weeks    Status New    Target Date 10/08/20      PT LONG TERM GOAL #4   Title Pt. will increase B UE/ core strength 1/2 muscle grade to improve upright posture/ pain-free mobility.    Baseline B UE strength grossly 4+/5 MMT. Lumbar AROM WFL and B knee AROM WFL    Time 4    Period Weeks    Status New    Target Date 10/08/20                   Plan - 10/08/20 7829     Clinical Impression Statement Tx. progression limited today by increase generalized joint/ muscle pain and fatigue.  PT tx. focused on manual shoulder/ cervical and lumbar stretches in supine position.  Good AROM  in B hips/shoulder with slight discomfort reported during R lateral flexion to end-range.  No L scapular pain during cervical stretches but L sided neck pain during manual L UT stretches.  No change to HEP and pt. encouraged to complete HEP prior to next tx. session.    Stability/Clinical Decision Making Evolving/Moderate complexity    Clinical Decision Making Moderate    Rehab Potential Fair    PT Frequency 2x / week    PT Duration 4 weeks    PT Treatment/Interventions ADLs/Self Care Home Management;Aquatic Therapy;Cryotherapy;Moist Heat;Electrical Stimulation;Functional mobility training;Neuromuscular re-education;Balance training;Therapeutic exercise;Therapeutic activities;Patient/family education;Manual techniques;Passive range of motion;Dry needling;Spinal Manipulations    PT Next Visit Plan Return to strengthening based ex. next tx.    PT Home Exercise Plan Access Code: [email or web address]    Consulted and Agree with Plan of Care Patient             Patient will benefit from skilled therapeutic intervention in order to improve the following deficits and impairments:  Pain, Postural dysfunction, Impaired flexibility, Decreased strength, Decreased activity tolerance, Decreased range of motion, Improper body mechanics, Difficulty walking, Decreased mobility, Decreased endurance  Visit Diagnosis: Cervical spondylosis  Neck pain  Chronic bilateral low back pain without sciatica  Muscle weakness (generalized)     Problem List Patient Active Problem List   Diagnosis Date Noted   Bilateral primary osteoarthritis of knee 09/09/2020   Weight loss, unintentional 09/09/2020   Cervical spondylosis 09/01/2020   Anemia 08/19/2020   Cervical paraspinous muscle spasm 08/19/2020   Thickened endometrium 08/14/2020   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   Iron deficiency anemia 05/02/2020   Seasonal allergies 06/07/2019   Dysmenorrhea 02/21/2019   MEE (middle ear effusion), bilateral 02/21/2019    Cervical stenosis of spine 06/08/2018   Hand joint pain 06/08/2018   Positive antinuclear antibody 06/08/2018   Anxiety 07/14/2016   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   Chronic abdominal pain 10/10/2014   Headache disorder 12/12/2013   Patellofemoral dysfunction 11/15/2013   Stress incontinence 08/10/2013   Lumbosacral pain 08/09/2013   Bursitis of both hips 08/09/2013   Other bursitis of hip, unspecified hip 08/09/2013   H/O vitamin D deficiency 06/23/2013   Personal history of other endocrine, nutritional and metabolic disease 56/21/3086   Iliotibial band syndrome 05/31/2013   Irritable bowel syndrome with  constipation 04/27/2013   Gastroesophageal reflux disease 04/04/2013   Eustachian tube dysfunction 11/29/2012   Lesion of sciatic nerve 09/27/2012   Piriformis syndrome 09/27/2012   Generalized anxiety disorder 03/28/2012   Cammie Mcgee, PT, DPT # [id number] 10/08/2020, 2:08 PM  [facility] [address] [place], [place], [address] Phone: [phone]   Fax:  [phone]  Name: Lindi Abram MRN: [id number] Date of Birth: 1970/06/09

## 2020-10-09 ENCOUNTER — Other Ambulatory Visit: Payer: Self-pay

## 2020-10-09 ENCOUNTER — Encounter: Payer: Self-pay | Admitting: Physical Therapy

## 2020-10-09 ENCOUNTER — Ambulatory Visit: Payer: Self-pay | Admitting: Physical Therapy

## 2020-10-09 DIAGNOSIS — G8929 Other chronic pain: Secondary | ICD-10-CM

## 2020-10-09 DIAGNOSIS — M545 Low back pain, unspecified: Secondary | ICD-10-CM

## 2020-10-09 DIAGNOSIS — M47812 Spondylosis without myelopathy or radiculopathy, cervical region: Secondary | ICD-10-CM

## 2020-10-09 DIAGNOSIS — M542 Cervicalgia: Secondary | ICD-10-CM

## 2020-10-09 DIAGNOSIS — M6281 Muscle weakness (generalized): Secondary | ICD-10-CM

## 2020-10-09 NOTE — Therapy (Signed)
[facility] [address] [place], [place], [address] Phone: [phone]   Fax:  [phone]  Physical Therapy Treatment/ Recertification  Patient Details  Name: Robin Schwartz MRN: [id number] Date of Birth: 07/18/1970 Referring Provider (PT): Dr. Rosette Reveal   Encounter Date: 10/09/2020   Treatment: 8 of 16.  Recert date: 2/40/9735 0949 to 61   Past Medical History:  Diagnosis Date   Anemia    Anxiety     Past Surgical History:  Procedure Laterality Date   BREAST EXCISIONAL BIOPSY Left 1992   CYSTOSCOPY  2003   LIPOMA EXCISION Left 2007   shoulder    There were no vitals filed for this visit.   Pt. reports no pain today. Pt. states she was active at home/ organizing cabinets and garage for an hour. Pt. prescribed Lexapro after MD appt. yesterday for anxiety. Pt. states her iron level and hemoglobin were WNL.     [facility] PT Assessment - 10/13/20 0001       Assessment   Medical Diagnosis Cervical Spondylosis/ Cervical paraspina muscle spasm    Referring Provider (PT) Dr. Rosette Reveal    Onset Date/Surgical Date 02/23/20    Prior Therapy no      Precautions   Precautions None      Restrictions   Weight Bearing Restrictions No      Home Environment   Living Environment Private residence      Prior Function   Level of Independence Independent    Vocation Part time employment    Pension scheme manager (5 hours/week)      Cognition   Overall Cognitive Status Within Functional Limits for tasks assessed             Ther.ex.:  Scifit L 7-6 10 min.  B UE/LE (warm-up/ discussed MD appts.). Nautilus: 20# cross body chopping (cuing for TrA activation): 15x each direction.     20# lat pull downs with wt. Bar 10x2 (alt. Foot position).      20# tricep extension 20x/ scapular retraction 20x/ 10# Palloff press 10x each.  Cervical AROM: flexion 48 deg./ extension 44 deg./ L lat. Flexion 36 deg./ R  lat. Flexion 33 deg. R sided neck pain/ pinching reported)/ L rotn. (58 deg.)/ R rotn. (52 deg.)  Supine TrA (reassessment of proper technique)- marching/ hip abduction/ bridging  Manual tx.:  Supine cervical traction 4x with static holds/ L and R UT and levator manual stretches 3x20 sec. Supine STM to L/R UT musculature/ shoulders       PT Long Term Goals - 10/13/20 0825       PT LONG TERM GOAL #1   Title Pt. will increase FOTO to 59 to improve pain-free functional mobility.    Baseline Initial FOTO on 7/20:  51.  8/18: 66    Time 4    Period Weeks    Status Achieved    Target Date 10/09/20      PT LONG TERM GOAL #2   Title Pt. will increase cervical AROM to [facility] with no increase c/o upper trap./scapular pain to improve mobility.    Baseline Cervical AROM: flexion 45 deg./ extension 35 deg./ L rotn. 50 deg./ R rotn. 43 deg. (pain)/ L lat. flex. 34 deg./ R 28 deg. (increase pain).  8/18:  Cervical AROM: flexion 48 deg./ extension 44 deg./ L lat. Flexion 36 deg./ R lat. Flexion 33 deg. R sided neck pain/ pinching reported)/ L rotn. (58 deg.)/ R rotn. (52  deg.)    Time 4    Period Weeks    Status Partially Met    Target Date 11/06/20      PT LONG TERM GOAL #3   Title Pt. 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    Baseline Increase c/o back pain with prolonged tasks.    Time 4    Period Weeks    Status Partially Met    Target Date 11/06/20      PT LONG TERM GOAL #4   Title Pt. will increase B UE/ core strength 1/2 muscle grade to improve upright posture/ pain-free mobility.    Baseline B UE strength grossly 4+/5 MMT. Lumbar AROM WFL and B knee AROM WFL    Time 4    Period Weeks    Status Partially Met    Target Date 11/06/20              FOTO: 66 (marked improvement). Pt. presents with less overall pain and tx. returned to focus on generalized core/ postural strengthening ex. program. Pt. able  to complete standing UE strengthening ex. at Nautilus with 20# wt. and cuing for proper form/ technique. Fatigue noted in B UE but no increase c/o pain. PT reeducated pt. on proper TrA muscle activation during all supine/ standing ther.ex. Pt. will continue to benefit from skilled PT services to maximize pain-free cervical AROM and core strengthening to improve functional mobility/ endurance with daily tasks.       Patient will benefit from skilled therapeutic intervention in order to improve the following deficits and impairments:  Pain, Postural dysfunction, Impaired flexibility, Decreased strength, Decreased activity tolerance, Decreased range of motion, Improper body mechanics, Difficulty walking, Decreased mobility, Decreased endurance  Visit Diagnosis: Cervical spondylosis  Neck pain  Chronic bilateral low back pain without sciatica  Muscle weakness (generalized)     Problem List Patient Active Problem List   Diagnosis Date Noted   Bilateral primary osteoarthritis of knee 09/09/2020   Weight loss, unintentional 09/09/2020   Cervical spondylosis 09/01/2020   Anemia 08/19/2020   Cervical paraspinous muscle spasm 08/19/2020   Thickened endometrium 08/14/2020   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   Iron deficiency anemia 05/02/2020   Seasonal allergies 06/07/2019   Dysmenorrhea 02/21/2019   MEE (middle ear effusion), bilateral 02/21/2019   Cervical stenosis of spine 06/08/2018   Hand joint pain 06/08/2018   Positive antinuclear antibody 06/08/2018   Anxiety 07/14/2016   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   Chronic abdominal pain 10/10/2014   Headache disorder 12/12/2013   Patellofemoral dysfunction 11/15/2013   Stress incontinence 08/10/2013   Lumbosacral pain 08/09/2013   Bursitis of both hips 08/09/2013   Other bursitis of hip, unspecified hip 08/09/2013   H/O vitamin D deficiency 06/23/2013   Personal history of other endocrine, nutritional and metabolic disease  58/30/9407   Iliotibial band syndrome 05/31/2013   Irritable bowel syndrome with constipation 04/27/2013   Gastroesophageal reflux disease 04/04/2013   Eustachian tube dysfunction 11/29/2012   Lesion of sciatic nerve 09/27/2012   Piriformis syndrome 09/27/2012   Generalized anxiety disorder 03/28/2012   Pura Spice, PT, DPT # [phone] 10/13/2020, 8:30 AM  [place] [facility] [address] [place], [place], [address] Phone: [phone]   Fax:  [phone]  Name: Jade Burkard MRN: [id number] Date of Birth: 05/08/1970

## 2020-10-12 ENCOUNTER — Encounter: Payer: Self-pay | Admitting: Oncology

## 2020-10-12 NOTE — Progress Notes (Signed)
I connected with Robin Schwartz on 10/12/20 at  1:45 PM EDT by video enabled telemedicine visit and verified that I am speaking with the correct person using two identifiers.   I discussed the limitations, risks, security and privacy concerns of performing an evaluation and management service by telemedicine and the availability of in-person appointments. I also discussed with the patient that there may be a patient responsible charge related to this service. The patient expressed understanding and agreed to proceed.  Other persons participating in the visit and their role in the encounter:  none  Patient's location:  home Provider's location:  work  Risk analyst Complaint:  routine f/u of iron deficiency anemia  History of present illness: Patient is a 50 year old African-American female referred to [place] for iron deficiency anemia.  She has seen [facility] hematology in the past in March 2022 and was recommended IV iron but did not eventually get it.  She also follows up with pulmonary for symptoms of shortness of breath.  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 for further work-up.  She has seen rheumatology once in the past for autoimmune work-up as well that had been unremarkable.  She had a cardiac MRI in the past which did not show an infiltrative process.  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  Most recent CBC from 07/31/2020 showed a white count of 3.4, H&H of 13.9/41 with an MCV of 75 and a platelet count of 187.  Her last iron studies were checked back in March 2022 which showed a ferritin level of 9.9 but an iron saturation of 93%.  Patient has been on oral iron on and off and most recently has been taking Vitron C   Patient has multiple complaints presently including feeling lightheaded at times as well as shortness of breath on exertion and heart palpitations.  She reports intermittent feeling of tingling in her  hands and feet as well as dry mouth and feeling jittery at times.     Results of blood work from 08/13/2020 showed Low ferritin of 23, H&H of 9.8/31.3 with an MCV of 77.9 and platelet count of 427.  ESR was normal, B12 folate and TSH normal.  LDH normal.    Interval history patient had some itchy sensation in her throat after the first dose of Feraheme and was given Benadryl and she took her own Ativan as well.  She was subsequently able to get her 2 doses of Feraheme without any significant side effects.  She still reports ongoing fatigue and palpitations which have not improved significantly despite receiving IV iron.  Menstrual cycles are not presently heavy.   Review of Systems  Constitutional:  Negative for chills, fever, malaise/fatigue and weight loss.  HENT:  Negative for congestion, ear discharge and nosebleeds.   Eyes:  Negative for blurred vision.  Respiratory:  Negative for cough, hemoptysis, sputum production, shortness of breath and wheezing.   Cardiovascular:  Negative for chest pain, palpitations, orthopnea and claudication.  Gastrointestinal:  Negative for abdominal pain, blood in stool, constipation, diarrhea, heartburn, melena, nausea and vomiting.  Genitourinary:  Negative for dysuria, flank pain, frequency, hematuria and urgency.  Musculoskeletal:  Negative for back pain, joint pain and myalgias.  Skin:  Negative for rash.  Neurological:  Negative for dizziness, tingling, focal weakness, seizures, weakness and headaches.  Endo/Heme/Allergies:  Does not bruise/bleed easily.  Psychiatric/Behavioral:  Negative for depression and suicidal ideas. The patient  does not have insomnia.    Allergies  Allergen Reactions   Contrast Media [Iodinated Diagnostic Agents] Itching    Ears and throat itching    Hydrocodone Other (See Comments)    Mentally distorted per patient   Propoxyphene Rash and Other (See Comments)    Hallucination    Past Medical History:  Diagnosis Date    Anemia    Anxiety     Past Surgical History:  Procedure Laterality Date   BREAST EXCISIONAL BIOPSY Left 1992   CYSTOSCOPY  2003   LIPOMA EXCISION Left 2007   shoulder    Social History   Socioeconomic History   Marital status: Divorced    Spouse name: Not on file   Number of children: 3   Years of education: 41   Highest education level: Master's degree (e.g., MA, MS, MEng, MEd, MSW, MBA)  Occupational History   Occupation: Unemployed  Tobacco Use   Smoking status: Never   Smokeless tobacco: Never  Vaping Use   Vaping Use: Never used  Substance and Sexual Activity   Alcohol use: Not Currently   Drug use: Never   Sexual activity: Not Currently  Other Topics Concern   Not on file  Social History Narrative   Not on file   Social Determinants of Health   Financial Resource Strain: Not on file  Food Insecurity: Not on file  Transportation Needs: Not on file  Physical Activity: Not on file  Stress: Not on file  Social Connections: Not on file  Intimate Partner Violence: Not on file    Family History  Problem Relation Age of Onset   Stroke Mother    COPD Maternal Aunt    Hypertension Maternal Uncle    Asthma Maternal Uncle    COPD Maternal Uncle    Diabetes Maternal Grandmother    Asthma Maternal Grandmother    Cancer Maternal Grandfather    Colon cancer Maternal Grandfather      Current Outpatient Medications:    LORazepam (ATIVAN) 1 MG tablet, Take 1 mg by mouth every 8 (eight) hours. PRN, Disp: , Rfl:    hydrOXYzine (ATARAX/VISTARIL) 25 MG tablet, Take 1 tablet (25 mg total) by mouth every 6 (six) hours. (Patient not taking: Reported on 09/09/2020), Disp: 25 tablet, Rfl: 0   metroNIDAZOLE (METROGEL) 0.75 % vaginal gel, Metrogel Vaginal 0.75 % (37.5 mg/5 gram)  INSERT 1 APPLICATORFUL VAGINALLY TWICE A DAY FOR 5 DAYS (Patient not taking: Reported on 10/08/2020), Disp: , Rfl:   No results found.  No images are attached to the encounter.   CMP Latest Ref  Rng & Units 08/19/2020  Glucose 65 - 99 mg/dL 82  BUN 6 - 24 mg/dL 8  Creatinine 0.57 - 1.00 mg/dL 0.66  Sodium 134 - 144 mmol/L 141  Potassium 3.5 - 5.2 mmol/L 4.0  Chloride 96 - 106 mmol/L 103  CO2 20 - 29 mmol/L 25  Calcium 8.7 - 10.2 mg/dL 9.4  Total Protein 6.0 - 8.5 g/dL 6.9  Total Bilirubin 0.0 - 1.2 mg/dL 0.2  Alkaline Phos 44 - 121 IU/L 51  AST 0 - 40 IU/L 17  ALT 0 - 32 IU/L 5   CBC Latest Ref Rng & Units 10/07/2020  WBC 4.0 - 10.5 K/uL 5.4  Hemoglobin 12.0 - 15.0 g/dL 12.6  Hematocrit 36.0 - 46.0 % 40.2  Platelets 150 - 400 K/uL 280     Observation/objective: Appears in no acute distress over video visit today.  Breathing is nonlabored  Assessment and plan: Patient is a 50 year old female with history of iron deficiency anemia and this is a routine follow-up visit  Patient's hemoglobin in June was 9.3 with evidence of iron deficiency.  After 2 doses of Feraheme her hemoglobin is now improved to 12.6 with a ferritin normal at 289 and iron saturation of 38%.  Mildly elevated iron saturation of 38% is not presently concerning.  She is not in need of any further IV iron at this time.  I will continue to monitor her iron levels every 3 months and I will see her back in 6 months.  Patient reports that her menstrual cycles and not presently having and therefore have asked the patient to discuss with GI regarding the need for capsule endoscopy to a certain the etiology of iron deficiency anemia.  Patient's symptoms of fatigue and palpitations as well as generalized body aches and no better after receiving IV iron.  She will discuss this further with her primary care doctor as it does not appear to be related to iron deficiency at this time  Follow-up instructions: As above   I discussed the assessment and treatment plan with the patient. The patient was provided an opportunity to ask questions and all were answered. The patient agreed with the plan and demonstrated an understanding  of the instructions.   The patient was advised to call back or seek an in-person evaluation if the symptoms worsen or if the condition fails to improve as anticipated.  Visit Diagnosis: 1. Other iron deficiency anemia     Dr. Randa Evens, MD, MPH [facility] at [facility] Tel- [phone] 10/12/2020 10:43 AM

## 2020-10-14 ENCOUNTER — Other Ambulatory Visit: Payer: Self-pay

## 2020-10-14 ENCOUNTER — Encounter: Payer: Self-pay | Admitting: Physical Therapy

## 2020-10-14 ENCOUNTER — Ambulatory Visit: Payer: Self-pay | Admitting: Physical Therapy

## 2020-10-14 DIAGNOSIS — M47812 Spondylosis without myelopathy or radiculopathy, cervical region: Secondary | ICD-10-CM

## 2020-10-14 DIAGNOSIS — M545 Low back pain, unspecified: Secondary | ICD-10-CM

## 2020-10-14 DIAGNOSIS — G8929 Other chronic pain: Secondary | ICD-10-CM

## 2020-10-14 DIAGNOSIS — M6281 Muscle weakness (generalized): Secondary | ICD-10-CM

## 2020-10-14 NOTE — Therapy (Signed)
[facility] [address] [place], [place], [address] Phone: [phone]   Fax:  [phone]  Physical Therapy Treatment  Patient Details  Name: Robin Schwartz MRN: [id number] Date of Birth: December 26, 1970 Referring Provider (PT): Dr. Rosette Reveal   Encounter Date: 10/14/2020   PT End of Session - 10/14/20 1753     Visit Number 9    Number of Visits 16    Date for PT Re-Evaluation 11/06/20    Authorization - Visit Number 9    Authorization - Number of Visits 10    PT Start Time [phone]    PT Stop Time 3785    PT Time Calculation (min) 47 min    Activity Tolerance Patient tolerated treatment well;Patient limited by pain    Behavior During Therapy [facility] for tasks assessed/performed             Past Medical History:  Diagnosis Date   Anemia    Anxiety     Past Surgical History:  Procedure Laterality Date   BREAST EXCISIONAL BIOPSY Left 1992   CYSTOSCOPY  2003   LIPOMA EXCISION Left 2007   shoulder    There were no vitals filed for this visit.   Subjective Assessment - 10/14/20 0956     Subjective Pt. had MD f/u with sleep specialist with no change in POC.  Pt. states she went bowling after last PT tx. and had a slight increase in UE pain/ fatigue.    Pertinent History See MD notes from [facility].    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 No/denies    Pain Onset More than a month ago              Ther.ex.:  Scifit L7 10 min. B UE/LE (discussed L foot swelling/ ant bite).   Nautilus: lat. Pull downs 30#/ tricep extension 20# 20x2.   Seated B shoulder press-ups 2# 20x2.  Bicep curls 2# 20x2.   Supine B shoulder flexion/ horizontal abduction 20x.   Reviewed seated/ supine cervical AROM   Manual tx:  Supine cervical traction 3x with static holds/ L and R UT and levator  manual stretches 3x20 sec. Supine STM to L/R UT musculature/ shoulders (L scapular pain)- "sharp"     PT Long Term Goals - 10/13/20 0825       PT LONG TERM GOAL #1   Title Pt. will increase FOTO to 59 to improve pain-free functional mobility.    Baseline Initial FOTO on 7/20:  51.  8/18: 66    Time 4    Period Weeks    Status Achieved    Target Date 10/09/20      PT LONG TERM GOAL #2   Title Pt. will increase cervical AROM to [facility] with no increase c/o upper trap./scapular pain to improve mobility.    Baseline Cervical AROM: flexion 45 deg./ extension 35 deg./ L rotn. 50 deg./ R rotn. 43 deg. (pain)/ L lat. flex. 34 deg./ R 28 deg. (increase pain).  8/18:  Cervical AROM: flexion 48 deg./ extension 44 deg./ L lat. Flexion 36 deg./ R lat. Flexion 33 deg. R sided neck pain/ pinching reported)/ L rotn. (58 deg.)/ R rotn. (52 deg.)    Time 4    Period Weeks    Status Partially Met    Target Date 11/06/20      PT LONG  TERM GOAL #3   Title Pt. 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    Baseline Increase c/o back pain with prolonged tasks.    Time 4    Period Weeks    Status Partially Met    Target Date 11/06/20      PT LONG TERM GOAL #4   Title Pt. will increase B UE/ core strength 1/2 muscle grade to improve upright posture/ pain-free mobility.    Baseline B UE strength grossly 4+/5 MMT. Lumbar AROM WFL and B knee AROM WFL    Time 4    Period Weeks    Status Partially Met    Target Date 11/06/20             Pt. did well with continued progression of UE/ postural/ core strengthening ex. during tx. session.  Pt. preferred to complete therex. in supine/ seated position today secondary to L foot/toe swelling.  Good technique/ upright posture during seated ther.ex.  No limitations in cervical AROM (all planes) of movement.  Good scapular mobility noted with overhead sh. flexion/ abduction.  Pt. had 1 episode of  sharp L scapular pain while in supine position with cervical ROM/ ex.  Pain resolves quickly with position changes/ no trigger points noted in scapular musculature.       Plan - 10/14/20 1753     Stability/Clinical Decision Making Evolving/Moderate complexity    Clinical Decision Making Moderate    Rehab Potential Fair    PT Frequency 2x / week    PT Duration 4 weeks    PT Treatment/Interventions ADLs/Self Care Home Management;Aquatic Therapy;Cryotherapy;Moist Heat;Electrical Stimulation;Functional mobility training;Neuromuscular re-education;Balance training;Therapeutic exercise;Therapeutic activities;Patient/family education;Manual techniques;Passive range of motion;Dry needling;Spinal Manipulations    PT Next Visit Plan Progress strengthening ex.  10th visit progress note next tx. session.    PT Home Exercise Plan Access Code: [email or web address]    Consulted and Agree with Plan of Care Patient             Patient will benefit from skilled therapeutic intervention in order to improve the following deficits and impairments:  Pain, Postural dysfunction, Impaired flexibility, Decreased strength, Decreased activity tolerance, Decreased range of motion, Improper body mechanics, Difficulty walking, Decreased mobility, Decreased endurance  Visit Diagnosis: Cervical spondylosis  Chronic bilateral low back pain without sciatica  Muscle weakness (generalized)     Problem List Patient Active Problem List   Diagnosis Date Noted   Bilateral primary osteoarthritis of knee 09/09/2020   Weight loss, unintentional 09/09/2020   Cervical spondylosis 09/01/2020   Anemia 08/19/2020   Cervical paraspinous muscle spasm 08/19/2020   Thickened endometrium 08/14/2020   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   Iron deficiency anemia 05/02/2020   Seasonal allergies 06/07/2019   Dysmenorrhea 02/21/2019   MEE (middle ear effusion), bilateral 02/21/2019   Cervical stenosis of spine 06/08/2018   Hand joint pain  06/08/2018   Positive antinuclear antibody 06/08/2018   Anxiety 07/14/2016   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   Chronic abdominal pain 10/10/2014   Headache disorder 12/12/2013   Patellofemoral dysfunction 11/15/2013   Stress incontinence 08/10/2013   Lumbosacral pain 08/09/2013   Bursitis of both hips 08/09/2013   Other bursitis of hip, unspecified hip 08/09/2013   H/O vitamin D deficiency 06/23/2013   Personal history of other endocrine, nutritional and metabolic disease 25/36/6440   Iliotibial band syndrome 05/31/2013   Irritable bowel syndrome with constipation 04/27/2013   Gastroesophageal reflux disease 04/04/2013  Eustachian tube dysfunction 11/29/2012   Lesion of sciatic nerve 09/27/2012   Piriformis syndrome 09/27/2012   Generalized anxiety disorder 03/28/2012   Pura Spice, PT, DPT # [id number] 10/14/2020, 6:07 PM  [facility] [address] [place], [place], [address] Phone: [phone]   Fax:  [phone]  Name: Robin Schwartz MRN: [id number] Date of Birth: [birth year]

## 2020-10-16 ENCOUNTER — Other Ambulatory Visit: Payer: Self-pay

## 2020-10-16 ENCOUNTER — Ambulatory Visit: Payer: Self-pay | Admitting: Physical Therapy

## 2020-10-16 ENCOUNTER — Encounter: Payer: Self-pay | Admitting: Physical Therapy

## 2020-10-16 DIAGNOSIS — G8929 Other chronic pain: Secondary | ICD-10-CM

## 2020-10-16 DIAGNOSIS — M6281 Muscle weakness (generalized): Secondary | ICD-10-CM

## 2020-10-16 DIAGNOSIS — M545 Low back pain, unspecified: Secondary | ICD-10-CM

## 2020-10-16 DIAGNOSIS — M542 Cervicalgia: Secondary | ICD-10-CM

## 2020-10-16 DIAGNOSIS — M47812 Spondylosis without myelopathy or radiculopathy, cervical region: Secondary | ICD-10-CM

## 2020-10-16 NOTE — Therapy (Addendum)
[facility] [address] [place], [place], [address] Phone: [phone]   Fax:  [phone]  Physical Therapy Treatment Physical Therapy Progress Note   Dates of reporting period  09/10/2020  to 10/16/2020  Patient Details  Name: Robin Schwartz MRN: [id number] Date of Birth: Jul 11, 1970 Referring Provider (PT): Dr. Rosette Reveal   Encounter Date: 10/16/2020     PT End of Session - 10/16/20 0935     Visit Number 10    Number of Visits 16    Date for PT Re-Evaluation 11/06/20    0953 to [place] Visit Number 10    Authorization - Number of Visits 10    Activity Tolerance Patient tolerated treatment well;Patient limited by pain    Behavior During Therapy [facility] for tasks assessed/performed               Past Medical History:  Diagnosis Date   Anemia    Anxiety     Past Surgical History:  Procedure Laterality Date   BREAST EXCISIONAL BIOPSY Left 1992   CYSTOSCOPY  2003   LIPOMA EXCISION Left 2007   shoulder    There were no vitals filed for this visit.   Pt. reports no new complaints. Pt. states foot swelling/ pain is improving and entered PT wearing sneakers today. No c/o pain while getting on Scifit machine.         Ther.ex.:   Scifit L7 10 min. B UE/LE (consistent cadence)   Nautilus: lat. Pull downs 30#/ scap. Retraction 30#/ tricep extension 20# 20x2.   Seated B shoulder press-ups 2# 20x2.  Bicep curls 2# 20x2.   Supine B shoulder flexion 2#/ horizontal abduction 2#/ press-ups 2# 20x.   Supine core ex. Marching/ bridging (added marching)- challenging/ good TrA control.   Supine trunk rotn./ knee to chest.      Manual tx:   Supine cervical traction 3x with static holds/ L and R UT and levator manual stretches 3x20 sec. Supine STM to L/R UT musculature/ shoulders (L scapular pain)- "sharp"        PT Long Term Goals - 10/20/20 0300       PT LONG TERM GOAL #1   Title Pt. will  increase FOTO to 59 to improve pain-free functional mobility.    Baseline Initial FOTO on 7/20:  51.  8/18: 66    Time 4    Period Weeks    Status Achieved    Target Date 10/09/20      PT LONG TERM GOAL #2   Title Pt. will increase cervical AROM to [facility] with no increase c/o upper trap./scapular pain to improve mobility.    Baseline Cervical AROM: flexion 45 deg./ extension 35 deg./ L rotn. 50 deg./ R rotn. 43 deg. (pain)/ L lat. flex. 34 deg./ R 28 deg. (increase pain).  8/18:  Cervical AROM: flexion 48 deg./ extension 44 deg./ L lat. Flexion 36 deg./ R lat. Flexion 33 deg. R sided neck pain/ pinching reported)/ L rotn. (58 deg.)/ R rotn. (52 deg.)    Time 4    Period Weeks    Status Partially Met    Target Date 11/06/20      PT LONG TERM GOAL #3   Title Pt. 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    Baseline Increase c/o back pain with prolonged tasks.    Time 4  Period Weeks    Status Partially Met    Target Date 11/06/20      PT LONG TERM GOAL #4   Title Pt. will increase B UE/ core strength 1/2 muscle grade to improve upright posture/ pain-free mobility.    Baseline B UE strength grossly 4+/5 MMT. Lumbar AROM WFL and B knee AROM WFL    Time 4    Period Weeks    Status Partially Met    Target Date 11/06/20              Pt. worked hard during tx. session with progression in core stability/ UE and postural ex. program. Pt. reports no initial pain in neck/upper back prior to tx. session but generalized low back/ hip discomfort and fatigue with resisted ther.exKermit Balo cervical/ B shoulder AROM all planes. Good TrA muscle activation. Pt. will continue to benefit from a generalized strengthening ex. program to improve pain-free mobility with daily tasks. See updated goals.       Patient will benefit from skilled therapeutic intervention in order to improve the following deficits and impairments:  Pain,  Postural dysfunction, Impaired flexibility, Decreased strength, Decreased activity tolerance, Decreased range of motion, Improper body mechanics, Difficulty walking, Decreased mobility, Decreased endurance  Visit Diagnosis: Cervical spondylosis  Chronic bilateral low back pain without sciatica  Muscle weakness (generalized)  Neck pain     Problem List Patient Active Problem List   Diagnosis Date Noted   Bilateral primary osteoarthritis of knee 09/09/2020   Weight loss, unintentional 09/09/2020   Cervical spondylosis 09/01/2020   Anemia 08/19/2020   Cervical paraspinous muscle spasm 08/19/2020   Thickened endometrium 08/14/2020   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   Iron deficiency anemia 05/02/2020   Seasonal allergies 06/07/2019   Dysmenorrhea 02/21/2019   MEE (middle ear effusion), bilateral 02/21/2019   Cervical stenosis of spine 06/08/2018   Hand joint pain 06/08/2018   Positive antinuclear antibody 06/08/2018   Anxiety 07/14/2016   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   Chronic abdominal pain 10/10/2014   Headache disorder 12/12/2013   Patellofemoral dysfunction 11/15/2013   Stress incontinence 08/10/2013   Lumbosacral pain 08/09/2013   Bursitis of both hips 08/09/2013   Other bursitis of hip, unspecified hip 08/09/2013   H/O vitamin D deficiency 06/23/2013   Personal history of other endocrine, nutritional and metabolic disease 73/53/2992   Iliotibial band syndrome 05/31/2013   Irritable bowel syndrome with constipation 04/27/2013   Gastroesophageal reflux disease 04/04/2013   Eustachian tube dysfunction 11/29/2012   Lesion of sciatic nerve 09/27/2012   Piriformis syndrome 09/27/2012   Generalized anxiety disorder 03/28/2012   Pura Spice, PT, DPT # [id number] 10/20/2020, 8:30 AM  [facility] [address] [place], [place], [address] Phone: [phone]   Fax:  [phone]  Name: Yomira Flitton MRN:  [id number] Date of Birth: [birth year]

## 2020-10-19 ENCOUNTER — Other Ambulatory Visit: Payer: Self-pay

## 2020-10-19 ENCOUNTER — Ambulatory Visit
Admission: RE | Admit: 2020-10-19 | Discharge: 2020-10-19 | Discharge status: Against Medical Advice | Payer: Self-pay | Source: Ambulatory Visit | Attending: Family Medicine | Admitting: Family Medicine

## 2020-10-19 NOTE — ED Triage Notes (Signed)
Pt reports ant bite on Monday, to her right 5th digit, has had intermittent swelling, tingling, and itching to her right foot.   Pt also reports also with intermittent CP, SOB, RUQ pain. Denis n/v/d, denies, urinary s/s.   Pt reports has PCP appt tomorrow, but wanted labs drawn today, advised no lab or radiology services at this time at this location. Pt verbalized understanding.

## 2020-10-19 NOTE — ED Notes (Addendum)
Pt stated she wanted to leave as radiology and lab services are not available, will see her PCP tomorrow for further work-up per Mrs. Roxan Hockey.  NAD noted, VSS Moody Bruins, NP notified and aware

## 2020-10-20 ENCOUNTER — Ambulatory Visit: Payer: Self-pay | Admitting: Physical Therapy

## 2020-10-20 ENCOUNTER — Emergency Department: Payer: Medicaid Other

## 2020-10-20 ENCOUNTER — Emergency Department
Admission: EM | Admit: 2020-10-20 | Discharge: 2020-10-20 | Disposition: A | Discharge status: Home / Self Care | Payer: Self-pay | Attending: Emergency Medicine | Admitting: Emergency Medicine

## 2020-10-20 ENCOUNTER — Ambulatory Visit: Payer: Medicaid Other | Admitting: Family Medicine

## 2020-10-20 ENCOUNTER — Other Ambulatory Visit: Payer: Self-pay

## 2020-10-20 DIAGNOSIS — R079 Chest pain, unspecified: Secondary | ICD-10-CM

## 2020-10-20 DIAGNOSIS — F331 Major depressive disorder, recurrent, moderate: Secondary | ICD-10-CM | POA: Insufficient documentation

## 2020-10-20 DIAGNOSIS — F411 Generalized anxiety disorder: Secondary | ICD-10-CM

## 2020-10-20 DIAGNOSIS — R0602 Shortness of breath: Secondary | ICD-10-CM | POA: Insufficient documentation

## 2020-10-20 DIAGNOSIS — E876 Hypokalemia: Secondary | ICD-10-CM | POA: Insufficient documentation

## 2020-10-20 DIAGNOSIS — F410 Panic disorder [episodic paroxysmal anxiety] without agoraphobia: Secondary | ICD-10-CM | POA: Insufficient documentation

## 2020-10-20 DIAGNOSIS — R0789 Other chest pain: Secondary | ICD-10-CM | POA: Insufficient documentation

## 2020-10-20 DIAGNOSIS — R1011 Right upper quadrant pain: Secondary | ICD-10-CM | POA: Insufficient documentation

## 2020-10-20 DIAGNOSIS — Z20822 Contact with and (suspected) exposure to covid-19: Secondary | ICD-10-CM | POA: Insufficient documentation

## 2020-10-20 DIAGNOSIS — N898 Other specified noninflammatory disorders of vagina: Secondary | ICD-10-CM | POA: Insufficient documentation

## 2020-10-20 DIAGNOSIS — F419 Anxiety disorder, unspecified: Secondary | ICD-10-CM

## 2020-10-20 LAB — HEPATIC FUNCTION PANEL
ALT: 11 U/L (ref 0–44)
AST: 24 U/L (ref 15–41)
Albumin: 4.1 g/dL (ref 3.5–5.0)
Alkaline Phosphatase: 49 U/L (ref 38–126)
Bilirubin, Direct: <0.1 mg/dL (ref 0.0–0.2)
Total Bilirubin: 0.4 mg/dL (ref 0.3–1.2)
Total Protein: 7.3 g/dL (ref 6.5–8.1)

## 2020-10-20 LAB — COMPREHENSIVE METABOLIC PANEL
ALT: 13 U/L (ref 0–44)
AST: 24 U/L (ref 15–41)
Albumin: 4.1 g/dL (ref 3.5–5.0)
Alkaline Phosphatase: 51 U/L (ref 38–126)
Anion gap: 7 (ref 5–15)
BUN: 9 mg/dL (ref 6–20)
CO2: 25 mmol/L (ref 22–32)
Calcium: 9.2 mg/dL (ref 8.9–10.3)
Chloride: 105 mmol/L (ref 98–111)
Creatinine, Ser: 0.66 mg/dL (ref 0.44–1.00)
GFR, Estimated: >60 mL/min (ref >60)
Glucose, Bld: 103 mg/dL — ABNORMAL HIGH (ref 70–99)
Potassium: 3.4 mmol/L — ABNORMAL LOW (ref 3.5–5.1)
Sodium: 137 mmol/L (ref 135–145)
Total Bilirubin: 0.5 mg/dL (ref 0.3–1.2)
Total Protein: 7.3 g/dL (ref 6.5–8.1)

## 2020-10-20 LAB — URINALYSIS, COMPLETE (UACMP) WITH MICROSCOPIC
Bacteria, UA: NONE SEEN
Bilirubin Urine: NEGATIVE
Glucose, UA: NEGATIVE mg/dL
Hgb urine dipstick: NEGATIVE
Ketones, ur: NEGATIVE mg/dL
Leukocytes,Ua: NEGATIVE
Nitrite: NEGATIVE
Protein, ur: NEGATIVE mg/dL
Specific Gravity, Urine: 1.003 — ABNORMAL LOW (ref 1.005–1.030)
pH: 6 (ref 5.0–8.0)

## 2020-10-20 LAB — HCG, QUANTITATIVE, PREGNANCY: hCG, Beta Chain, Quant, S: 1 m[IU]/mL (ref <5)

## 2020-10-20 LAB — RESP PANEL BY RT-PCR (FLU A&B, COVID) ARPGX2
Influenza A by PCR: NEGATIVE
Influenza B by PCR: NEGATIVE
SARS Coronavirus 2 by RT PCR: NEGATIVE

## 2020-10-20 LAB — CBC
HCT: 38.4 % (ref 36.0–46.0)
Hemoglobin: 12.6 g/dL (ref 12.0–15.0)
MCH: 27.2 pg (ref 26.0–34.0)
MCHC: 32.8 g/dL (ref 30.0–36.0)
MCV: 82.8 fL (ref 80.0–100.0)
Platelets: 291 10*3/uL (ref 150–400)
RBC: 4.64 MIL/uL (ref 3.87–5.11)
RDW: 16.2 % — ABNORMAL HIGH (ref 11.5–15.5)
WBC: 7.1 10*3/uL (ref 4.0–10.5)
nRBC: 0 % (ref 0.0–0.2)

## 2020-10-20 LAB — TROPONIN I (HIGH SENSITIVITY): Troponin I (High Sensitivity): 8 ng/L (ref <18)

## 2020-10-20 LAB — LIPASE, BLOOD: Lipase: 50 U/L (ref 11–51)

## 2020-10-20 MED ORDER — PANTOPRAZOLE SODIUM 40 MG PO TBEC
40.0000 mg | DELAYED_RELEASE_TABLET | Freq: Once | ORAL | Status: AC
  Administered 2020-10-20: 40 mg via ORAL
  Filled 2020-10-20: qty 1

## 2020-10-20 MED ORDER — LORAZEPAM 1 MG PO TABS
1.0000 mg | ORAL_TABLET | Freq: Once | ORAL | Status: AC
  Administered 2020-10-20: 1 mg via ORAL
  Filled 2020-10-20: qty 1

## 2020-10-20 MED ORDER — POTASSIUM CHLORIDE CRYS ER 20 MEQ PO TBCR
40.0000 meq | EXTENDED_RELEASE_TABLET | Freq: Once | ORAL | Status: AC
  Administered 2020-10-20: 40 meq via ORAL
  Filled 2020-10-20: qty 2

## 2020-10-20 MED ORDER — SUCRALFATE 1 G PO TABS
1.0000 g | ORAL_TABLET | Freq: Once | ORAL | Status: AC
  Administered 2020-10-20: 1 g via ORAL
  Filled 2020-10-20: qty 1

## 2020-10-20 MED ORDER — ALUM & MAG HYDROXIDE-SIMETH 200-200-20 MG/5ML PO SUSP
30.0000 mL | Freq: Once | ORAL | Status: AC
  Administered 2020-10-20: 30 mL via ORAL
  Filled 2020-10-20: qty 30

## 2020-10-20 NOTE — ED Notes (Signed)
US at bedside

## 2020-10-20 NOTE — BH Assessment (Signed)
Comprehensive Clinical Assessment (CCA) Note  10/20/2020 Robin Schwartz [id number]  Robin Schwartz, 50 year old female who presents to [facility] ED voluntarily for treatment. Per triage note, Pt states chest for four days with shob. Pt states she also took a Lexapro for the first time at midnight and began to have "jittery and anxious feeling". Pt appears anxious. Pt denies fever, cough.   During TTS assessment pt presents alert and oriented x 4, anxious but cooperative, and mood-congruent with affect. The pt does not appear to be responding to internal or external stimuli. Neither is the pt presenting with any delusional thinking. Pt verified the information provided to triage RN.   Pt identifies her main complaint to be that she was having severe side effects from taking a medication (Lexapro) that she was taking for the first time. Patient reports she is usually "super sensitive" to most medications and is prescribed the lowest dose. Patient states she was unable to cut the pill in half as indicated by her doctor and therefore took the whole pill. Patient states after a few hours, she began to feel jittery, uncomfortable and began "freaking out" and wasn't quite sure if she could be alone. Patient reports she drove herself to the fire station and was brought in. Patient reports she is feeling much better and safe at the hospital. Pt denies using any illicit substances and alcohol. Pt reports having no prior INPT hx but sees Dr. Haskel Khan for outpatient tx. Pt reports having several medical issues. Pt denies SI/HI/AH/VH. Pt is able to contract for safety and will follow up with local services for intensive outpatient treatment.    Per Asher Muir, NP pt does not meet criteria for inpatient psychiatric admission.    Chief Complaint:  Chief Complaint  Patient presents with   Chest Pain   Visit Diagnosis: Major depressive disorder, recurrent episode, moderate    CCA Screening, Triage and Referral  (STR)  Patient Reported Information How did you hear about [place]? Self  Referral name: No data recorded Referral phone number: No data recorded  Whom do you see for routine medical problems? No data recorded Practice/Facility Name: No data recorded Practice/Facility Phone Number: No data recorded Name of Contact: No data recorded Contact Number: No data recorded Contact Fax Number: No data recorded Prescriber Name: No data recorded Prescriber Address (if known): No data recorded  What Is the Reason for Your Visit/Call Today? Patient reports having "anxiety disorder" and experiencing chest pain.  How Long Has This Been Causing You Problems? <Week  What Do You Feel Would Help You the Most Today? Medication(s) (Assessment)   Have You Recently Been in Any Inpatient Treatment (Hospital/Detox/Crisis Center/28-Day Program)? No data recorded Name/Location of Program/Hospital:No data recorded How Long Were You There? No data recorded When Were You Discharged? No data recorded  Have You Ever Received Services From [facility] Before? No data recorded Who Do You See at [facility]? No data recorded  Have You Recently Had Any Thoughts About Hurting Yourself? Yes  Are You Planning to Commit Suicide/Harm Yourself At This time? No   Have you Recently Had Thoughts About Hurting Someone Robin Schwartz? No  Explanation: No data recorded  Have You Used Any Alcohol or Drugs in the Past 24 Hours? No  How Long Ago Did You Use Drugs or Alcohol? No data recorded What Did You Use and How Much? No data recorded  Do You Currently Have a Therapist/Psychiatrist? Yes  Name of Therapist/Psychiatrist: Dr. Haskel Khan   Have You  Been Recently Discharged From Any Office Practice or Programs? No  Explanation of Discharge From Practice/Program: No data recorded    CCA Screening Triage Referral Assessment Type of Contact: Face-to-Face  Is this Initial or Reassessment? No data recorded Date Telepsych consult  ordered in [facility]:  No data recorded Time Telepsych consult ordered in [facility]:  No data recorded  Patient Reported Information Reviewed? No data recorded Patient Left Without Being Seen? No data recorded Reason for Not Completing Assessment: No data recorded  Collateral Involvement: None provided   Does Patient Have a Court Appointed Legal Guardian? No data recorded Name and Contact of Legal Guardian: No data recorded If Minor and Not Living with Parent(s), Who has Custody? n/a  Is CPS involved or ever been involved? Never  Is APS involved or ever been involved? Never   Patient Determined To Be At Risk for Harm To Self or Others Based on Review of Patient Reported Information or Presenting Complaint? No  Method: No data recorded Availability of Means: No data recorded Intent: No data recorded Notification Required: No data recorded Additional Information for Danger to Others Potential: No data recorded Additional Comments for Danger to Others Potential: No data recorded Are There Guns or Other Weapons in Your Home? No data recorded Types of Guns/Weapons: No data recorded Are These Weapons Safely Secured?                            No data recorded Who Could Verify You Are Able To Have These Secured: No data recorded Do You Have any Outstanding Charges, Pending Court Dates, Parole/Probation? No data recorded Contacted To Inform of Risk of Harm To Self or Others: No data recorded  Location of Assessment: [facility] ED   Does Patient Present under Involuntary Commitment? No  IVC Papers Initial File Date: No data recorded  [place] of Residence: Robin Schwartz   Patient Currently Receiving the Following Services: Individual Therapy; Medication Management   Determination of Need: Urgent (48 hours)   Options For Referral: ED Visit; Intensive Outpatient Therapy; Medication Management  Recommendations for Services/Supports/Treatments:    DSM5 Diagnoses: Patient Active Problem List    Diagnosis Date Noted   Major depressive disorder, recurrent episode, moderate (HCC) 10/20/2020   Panic disorder 10/20/2020   Bilateral primary osteoarthritis of knee 09/09/2020   Weight loss, unintentional 09/09/2020   Cervical spondylosis 09/01/2020   Anemia 08/19/2020   Cervical paraspinous muscle spasm 08/19/2020   Thickened endometrium 08/14/2020   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   Iron deficiency anemia 05/02/2020   Seasonal allergies 06/07/2019   Dysmenorrhea 02/21/2019   MEE (middle ear effusion), bilateral 02/21/2019   Cervical stenosis of spine 06/08/2018   Hand joint pain 06/08/2018   Positive antinuclear antibody 06/08/2018   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   Chronic abdominal pain 10/10/2014   Headache disorder 12/12/2013   Patellofemoral dysfunction 11/15/2013   Stress incontinence 08/10/2013   Lumbosacral pain 08/09/2013   Bursitis of both hips 08/09/2013   Other bursitis of hip, unspecified hip 08/09/2013   H/O vitamin D deficiency 06/23/2013   Personal history of other endocrine, nutritional and metabolic disease 05/69/7948   Iliotibial band syndrome 05/31/2013   Irritable bowel syndrome with constipation 04/27/2013   Gastroesophageal reflux disease 04/04/2013   Eustachian tube dysfunction 11/29/2012   Lesion of sciatic nerve 09/27/2012   Piriformis syndrome 09/27/2012   Generalized anxiety disorder 03/28/2012    Patient Centered Plan: Patient is on the following Treatment Plan(s):  Anxiety   Referrals to Alternative Service(s): Referred to Alternative Service(s):   Place:   Date:   Time:    Referred to Alternative Service(s):   Place:   Date:   Time:    Referred to Alternative Service(s):   Place:   Date:   Time:    Referred to Alternative Service(s):   Place:   Date:   Time:     Deola Rewis Dierdre Searles, Counselor, LCAS-A

## 2020-10-20 NOTE — ED Provider Notes (Signed)
[facility] Emergency Department Provider Note  ____________________________________________   Event Date/Time   First MD Initiated Contact with Patient 10/20/20 [phone]     (approximate)  I have reviewed the triage vital signs and the nursing notes.   HISTORY  Chief Complaint Chest Pain   HPI Dee Wisham is a 50 y.o. female with a past medical history of anemia and anxiety on as needed Ativan and having recently prescribed Lexapro taken the first dose last night who presents for assessment of several concerns.  It seems patient was feeling more anxious than usual last night and took her Lexapro which she feels made her anxiety much worse.  She states she developed severe palpitations and some diarrhea earlier this morning.  She states [place] herself [place] the fire house when doing so felt very unsafe and was unsure "what I was going to".  When asked if she is feeling like she is got hurt herself or feeling suicidal he says "I do not know I just do not know at this time".  She denies any HI or hallucinations.  She denies any illicit drug use or EtOH use.  She also notes that over the last 3 to 4 days she has had some chest tightness and shortness of breath with exertion.  She has not had any earache, sore throat, or fevers but has had mild nonproductive cough.  She also states that for the last several weeks she has had some intermittent pains in her upper and right upper quadrant of her abdomen.  She states she is scheduled to see GI next week but feels a little worse today.  She denies any urinary symptoms, back pain, lower abdominal pain, rash or recent injuries or falls.  She denies any other acute concerns at this time.         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   Iron deficiency anemia 05/02/2020   Seasonal allergies 06/07/2019   Dysmenorrhea 02/21/2019   MEE (middle ear effusion), bilateral 02/21/2019   Cervical stenosis of spine 06/08/2018   Hand joint pain 06/08/2018   Positive antinuclear antibody 06/08/2018   Anxiety 07/14/2016   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   Chronic abdominal pain 10/10/2014   Headache disorder 12/12/2013   Patellofemoral dysfunction 11/15/2013   Stress incontinence 08/10/2013   Lumbosacral pain 08/09/2013   Bursitis of both hips 08/09/2013   Other bursitis of hip, unspecified hip 08/09/2013   H/O vitamin D deficiency 06/23/2013   Personal history of other endocrine, nutritional and metabolic disease 15/17/6160   Iliotibial band syndrome 05/31/2013   Irritable bowel syndrome with constipation 04/27/2013   Gastroesophageal reflux disease 04/04/2013   Eustachian tube dysfunction 11/29/2012   Lesion of sciatic nerve 09/27/2012   Piriformis syndrome 09/27/2012   Generalized anxiety disorder 03/28/2012    Past Surgical History:  Procedure Laterality Date   BREAST EXCISIONAL BIOPSY Left 1992   CYSTOSCOPY  2003   LIPOMA EXCISION Left 2007   shoulder    Prior to Admission medications   Medication Sig Start Date End Date Taking? Authorizing Provider  hydrOXYzine (ATARAX/VISTARIL) 25 MG tablet Take 1 tablet (  25 mg total) by mouth every 6 (six) hours. Patient not taking: Reported on 09/09/2020 09/06/20   Becky Augusta, NP  LORazepam (ATIVAN) 1 MG tablet Take 1 mg by mouth every 8 (eight) hours. PRN    [provider]    Allergies Contrast media [iodinated diagnostic agents], Hydrocodone, and Propoxyphene  Family History  Problem Relation Age of Onset   Stroke Mother    COPD Maternal Aunt    Hypertension Maternal Uncle    Asthma Maternal  Uncle    COPD Maternal Uncle    Diabetes Maternal Grandmother    Asthma Maternal Grandmother    Cancer Maternal Grandfather    Colon cancer Maternal Grandfather     Social History Social History   Tobacco Use   Smoking status: Never   Smokeless tobacco: Never  Vaping Use   Vaping Use: Never used  Substance Use Topics   Alcohol use: Not Currently   Drug use: Never    Review of Systems  Review of Systems  Constitutional:  Negative for chills and fever.  HENT:  Negative for sore throat.   Eyes:  Negative for pain.  Respiratory:  Positive for cough and shortness of breath. Negative for stridor.   Cardiovascular:  Positive for chest pain and palpitations.  Gastrointestinal:  Positive for diarrhea. Negative for vomiting.  Genitourinary:  Negative for dysuria.  Musculoskeletal:  Negative for myalgias.  Skin:  Negative for rash.  Neurological:  Negative for seizures, loss of consciousness and headaches.  Psychiatric/Behavioral:  The patient is nervous/anxious.   All other systems reviewed and are negative.    ____________________________________________   PHYSICAL EXAM:  VITAL SIGNS: ED Triage Vitals  Enc Vitals Group     BP 10/20/20 0601 (!) 132/92     Pulse Rate 10/20/20 0601 87     Resp 10/20/20 0601 16     Temp 10/20/20 0601 98.6 F (37 C)     Temp Source 10/20/20 0601 Oral     SpO2 10/20/20 0601 100 %     Weight 10/20/20 0602 123 lb (55.8 kg)     Height 10/20/20 0602 5\' 4"  (1.626 m)     Head Circumference --      Peak Flow --      Pain Score 10/20/20 0602 7     Pain Loc --      Pain Edu? --      Excl. in GC? --    Vitals:   10/20/20 0800 10/20/20 0945  BP: (!) 158/74 130/75  Pulse: 81 63  Resp: 18 18  Temp:    SpO2: 100% 98%   Physical Exam Vitals and nursing note reviewed.  Constitutional:      General: She is not in acute distress.    Appearance: She is well-developed.  HENT:     Head: Normocephalic and atraumatic.     Right Ear: External  ear normal.     Left Ear: External ear normal.     Nose: Nose normal.     Mouth/Throat:     Mouth: Mucous membranes are moist.  Eyes:     Conjunctiva/sclera: Conjunctivae normal.  Cardiovascular:     Rate and Rhythm: Normal rate and regular rhythm.     Heart sounds: No murmur heard. Pulmonary:     Effort: Pulmonary effort is normal. No respiratory distress.     Breath sounds: Normal breath sounds.  Abdominal:     Palpations: Abdomen is soft.     Tenderness: There is no  abdominal tenderness. There is no right CVA tenderness or left CVA tenderness.  Musculoskeletal:     Cervical back: Neck supple.     Right lower leg: No edema.     Left lower leg: No edema.  Skin:    General: Skin is warm and dry.     Capillary Refill: Capillary refill takes less than 2 seconds.  Neurological:     Mental Status: She is alert and oriented to person, place, and time.  Psychiatric:        Mood and Affect: Mood normal.     ____________________________________________   LABS (all labs ordered are listed, but only abnormal results are displayed)  Labs Reviewed  CBC - Abnormal; Notable for the following components:      Result Value   RDW 16.2 (*)    All other components within normal limits  COMPREHENSIVE METABOLIC PANEL - Abnormal; Notable for the following components:   Potassium 3.4 (*)    Glucose, Bld 103 (*)    All other components within normal limits  URINALYSIS, COMPLETE (UACMP) WITH MICROSCOPIC - Abnormal; Notable for the following components:   Color, Urine COLORLESS (*)    APPearance CLEAR (*)    Specific Gravity, Urine 1.003 (*)    All other components within normal limits  RESP PANEL BY RT-PCR (FLU A&B, COVID) ARPGX2  HCG, QUANTITATIVE, PREGNANCY  HEPATIC FUNCTION PANEL  LIPASE, BLOOD  ACETAMINOPHEN LEVEL  SALICYLATE LEVEL  POC URINE PREG, ED  TROPONIN I (HIGH SENSITIVITY)   ____________________________________________  EKG  Sinus rhythm with a ventricular rate of 89,  normal axis, some nonspecific ST changes in V2, lead II, III, and aVF without other clear evidence of acute ischemia or significant arrhythmia. ____________________________________________  RADIOLOGY  ED MD interpretation: Chest x-ray shows no evidence of focal consolidation, effusion, edema, pneumothorax or other clear acute intrathoracic process.  Right upper quadrant ultrasound is unremarkable.  No evidence of cholecystitis, cholelithiasis, dilated common bile duct or abnormalities of the portal vein.  Official radiology report(s): DG Chest 2 View  Result Date: 10/20/2020 CLINICAL DATA:  Chest pain and shortness of breath. EXAM: CHEST - 2 VIEW COMPARISON:  08/14/2020 FINDINGS: Lungs are hyperexpanded. Right lung clear. Similar appearance of a bandlike opacity over the left upper lung., presumably scar. The cardiopericardial silhouette is within normal limits for size. The visualized bony structures of the thorax show no acute abnormality. IMPRESSION: No active cardiopulmonary disease. Electronically Signed   By: Kennith Center M.D.   On: 10/20/2020 06:44   US ABDOMEN LIMITED RUQ (LIVER/GB)  Result Date: 10/20/2020 CLINICAL DATA:  Right upper quadrant abdominal pain for 1 month. EXAM: ULTRASOUND ABDOMEN LIMITED RIGHT UPPER QUADRANT COMPARISON:  None. FINDINGS: Gallbladder: No gallstones or wall thickening visualized. No sonographic Murphy sign noted by sonographer. Common bile duct: Diameter: 1 mm which is within normal limits. Liver: No focal lesion identified. Within normal limits in parenchymal echogenicity. Portal vein is patent on color Doppler imaging with normal direction of blood flow towards the liver. Other: None. IMPRESSION: No definite abnormality seen in the right upper quadrant of the abdomen. Electronically Signed   By: Lupita Raider M.D.   On: 10/20/2020 08:26    ____________________________________________   PROCEDURES  Procedure(s) performed (including Critical  Care):  Procedures   ____________________________________________   INITIAL IMPRESSION / ASSESSMENT AND PLAN / ED COURSE      Patient presents with above-stated history and exam for assessment of several concerns including worsening of some anxiety after  taking Lexapro for the first time last night as well as 3 to 4 days of some chest tightness and shortness of breath with exertion and mild nonproductive cough as well as some mild acute on chronic epigastric and upper quadrant abdominal pain and diarrhea that started she says earlier this morning while in emergency room.   With regard to patient's palpitations chest pain shortness of breath as well as a nonproductive cough for last couple days suspect possible bronchitis and MSK i.e. costochondritis versus pleurisy.  I have a low suspicion for ACS myocarditis or arrhythmia given reassuring EKG and nonelevated troponin obtained greater than 3 hours after symptom onset.  Patient has no fever or findings on chest x-ray to suggest bacterial pneumonia at this time.  In addition she has no evidence of pneumothorax heart failure or other acute thoracic process on chest x-ray.  I have a low suspicion for PE as patient is PERC negative.  CMP remarkable for K of 3.4 without any other significant electrolyte or metabolic derangements.  Will replete as this is possibly contributing to palpitations.  CBC shows no leukocytosis or acute anemia.  With regard to her epigastric right upper quadrant pain is possible she gastritis or peptic ulcer disease.  CMP shows no evidence of hepatitis or cholestasis.  Lipase is not consistent with acute pancreatitis she has no lower abdominal tenderness leukocytosis or other symptoms to suggest acute GU pathology. Right upper quadrant ultrasound is unremarkable.  No evidence of cholecystitis, cholelithiasis, dilated common bile duct or abnormalities of the portal vein.  Low suspicion for other immediate life-threatening process  patient states she is feeling better after the liter of GI cocktail.  I think she is stable for discharge with close outpatient follow-up with regard to the symptoms.  With regard to her fairly significant anxiety patient did ask concerning statements and asked that she is any thoughts of harming herself stating in several ways "I do not know for just not sure what would happen".  She is amenable to discussing with psychiatry today she does not have any appointments until later this week.  We will consult psychiatry and TTS.  The patient has been placed in psychiatric observation due to the need to provide a safe environment for the patient while obtaining psychiatric consultation and evaluation, as well as ongoing medical and medication management to treat the patient's condition.  The patient has not been placed under full IVC at this time.      ____________________________________________   FINAL CLINICAL IMPRESSION(S) / ED DIAGNOSES  Final diagnoses:  RUQ pain  Chest pain, unspecified type  Anxiety  Hypokalemia  SOB (shortness of breath)    Medications  LORazepam (ATIVAN) tablet 1 mg (1 mg Oral Given 10/20/20 0751)  potassium chloride SA (KLOR-CON) CR tablet 40 mEq (40 mEq Oral Given 10/20/20 0751)  alum & mag hydroxide-simeth (MAALOX/MYLANTA) 200-200-20 MG/5ML suspension 30 mL (30 mLs Oral Given 10/20/20 0917)  sucralfate (CARAFATE) tablet 1 g (1 g Oral Given 10/20/20 0917)  pantoprazole (PROTONIX) EC tablet 40 mg (40 mg Oral Given 10/20/20 [id number])     ED Discharge Orders     None        Note:  This document was prepared using Dragon voice recognition software and may include unintentional dictation errors.    Gilles ChiquitoSmith, Marquell Saenz P, MD 10/20/20 1351

## 2020-10-20 NOTE — ED Triage Notes (Signed)
Pt states chest for four days with shob. Pt states she also took a lexapro for the first time at midnight and began to have "jittery and anxious feeling". Pt appears anxious. Pt denies fever, cough.

## 2020-10-20 NOTE — ED Notes (Signed)
Patient asking to speak with doctor at this time. Derrill Kay, MD made aware.

## 2020-10-20 NOTE — ED Notes (Signed)
ED Provider at bedside. 

## 2020-10-20 NOTE — ED Notes (Signed)
Psych team at bedside .

## 2020-10-20 NOTE — ED Notes (Signed)
Pt stated having a reaction to her anxiety medication which made her anxiety worse. Stated that she felt "bad" and had feelings to hurt herself prior to coming to hospital. When asked again whether she wanted to hurt herself or others, she denied those feelings.

## 2020-10-20 NOTE — Consult Note (Signed)
[facility] Face-to-Face Psychiatry Consult   Reason for Consult:  Generalized anxiety disorder Referring Physician:  EDP Patient Identification: Robin Schwartz MRN:  [id number] Principal Diagnosis: Major depressive disorder, recurrent episode, moderate (HCC) Diagnosis:  Principal Problem:   Major depressive disorder, recurrent episode, moderate (HCC) Active Problems:   Generalized anxiety disorder   Panic disorder   Total Time spent with patient: 1 hour  Subjective:   Robin Schwartz is a 50 y.o. female patient admitted with generalized anxiety disorder.    HPI:  Patient anxious but cooperative with interview. Patient stated that "I have an anxiety disorder", and further elaborated that "I had a panic attack. I got in my car at 5am and started driving, but I didn't know where I was driving to. I drove to the fire department because they've always said if you're having a problem they can help. They checked me out and brought me here" Patient explained that she believes her panic attack was the result of taking 5mg  Lexapro late last night. "My doctor told me to cut the 5mg  in half, but I wasn't able to so I just took the full 5mg ." She said "I woke up feeling weird, jittery, worried and uncomfortable." Patient stated that "I'll go for months without any issues, then I have really bad anxiety for weeks. I have strategies for calming myself but I couldn't this morning. I kept worrying 'what if I do something bad?'"   Patient explained that she has taken various medications over the past twelve years, but that she's had significant side effects causing her to stop taking. "I took clozapam 12 years ago and had the same effect. I tried it again 3-4 months ago and felt zombie-like. I also had chest pains, shortness of breath, and headaches by day 3" Patient states these side effects are also present when she takes other medications. Patient does state that she takes 0.5mg  ativan as needed without issue. She  also says that she previously took gabapentin and cymbalta "because I have a nerve and muscle issue." But that she had a "bad reaction" to those meds, as well.   Patient states "I feel safe now." And explained that she now lives alone after her two sons recently moved out. She states "it was rough at first, but I'm used to it now. I just hadn't lived by myself in over two years." Patient identifies her support system as her therapist, older son, and two female friends but stresses that "I don't want to be too much of a burden on my son." Patient states she was seen by a provider at [facility] three months ago. When team brought up potential courses of treatment, patient stated "I'm not sure how this process goes. I've never had a psych team. If I were inpatient how long would I be there? My girlfriend has bipolar disorder and was suicidal. She got taken away for all that. Her problems have been really affecting my psyche." Patient expressed uncertainty of inpatient vs intensive outpatient treatment for next steps. Patient states she is currently unemployed. She states that her girlfriend will allow her to stay with her and agreeable to attend IOP at [facility].  Denies suicidal/homicidal ideations, hallucinations, or substance abuse.  Past Psychiatric History: anxiety, depression  Risk to Self:  No Risk to Others:  No Prior Inpatient Therapy: None Prior Outpatient Therapy:  Yes  Past Medical History:  Past Medical History:  Diagnosis Date   Anemia    Anxiety     Past  Surgical History:  Procedure Laterality Date   BREAST EXCISIONAL BIOPSY Left 1992   CYSTOSCOPY  2003   LIPOMA EXCISION Left 2007   shoulder   Family History:  Family History  Problem Relation Age of Onset   Stroke Mother    COPD Maternal Aunt    Hypertension Maternal Uncle    Asthma Maternal Uncle    COPD Maternal Uncle    Diabetes Maternal Grandmother    Asthma Maternal Grandmother    Cancer Maternal Grandfather    Colon cancer  Maternal Grandfather    Family Psychiatric  History: none Social History:  Social History   Substance and Sexual Activity  Alcohol Use Not Currently     Social History   Substance and Sexual Activity  Drug Use Never    Social History   Socioeconomic History   Marital status: Divorced    Spouse name: Not on file   Number of children: 3   Years of education: 18   Highest education level: Master's degree (e.g., MA, MS, MEng, MEd, MSW, MBA)  Occupational History   Occupation: Unemployed  Tobacco Use   Smoking status: Never   Smokeless tobacco: Never  Vaping Use   Vaping Use: Never used  Substance and Sexual Activity   Alcohol use: Not Currently   Drug use: Never   Sexual activity: Not Currently  Other Topics Concern   Not on file  Social History Narrative   Not on file   Social Determinants of Health   Financial Resource Strain: Not on file  Food Insecurity: Not on file  Transportation Needs: Not on file  Physical Activity: Not on file  Stress: Not on file  Social Connections: Not on file   Additional Social History:    Allergies:   Allergies  Allergen Reactions   Contrast Media [Iodinated Diagnostic Agents] Itching    Ears and throat itching    Hydrocodone Other (See Comments)    Mentally distorted per patient   Propoxyphene Rash and Other (See Comments)    Hallucination    Labs:  Results for orders placed or performed during the hospital encounter of 10/20/20 (from the past 48 hour(s))  CBC     Status: Abnormal   Collection Time: 10/20/20  6:10 AM  Result Value Ref Range   WBC 7.1 4.0 - 10.5 K/uL   RBC 4.64 3.87 - 5.11 MIL/uL   Hemoglobin 12.6 12.0 - 15.0 g/dL   HCT 92.1 19.4 - 17.4 %   MCV 82.8 80.0 - 100.0 fL   MCH 27.2 26.0 - 34.0 pg   MCHC 32.8 30.0 - 36.0 g/dL   RDW 08.1 (H) 44.8 - 18.5 %   Platelets 291 150 - 400 K/uL   nRBC 0.0 0.0 - 0.2 %    Comment: Performed at [facility], [address] [place], [place] [address]   Troponin I (High Sensitivity)     Status: None   Collection Time: 10/20/20  6:10 AM  Result Value Ref Range   Troponin I (High Sensitivity) 8 <18 ng/L    Comment: (NOTE) Elevated high sensitivity troponin I (hsTnI) values and significant  changes across serial measurements may suggest ACS but many other  chronic and acute conditions are known to elevate hsTnI results.  Refer to the "Links" section for chest pain algorithms and additional  guidance. Performed at [facility], [address] [place], [place] [address]   Comprehensive metabolic panel     Status: Abnormal   Collection  Time: 10/20/20  6:10 AM  Result Value Ref Range   Sodium 137 135 - 145 mmol/L   Potassium 3.4 (L) 3.5 - 5.1 mmol/L   Chloride 105 98 - 111 mmol/L   CO2 25 22 - 32 mmol/L   Glucose, Bld 103 (H) 70 - 99 mg/dL    Comment: Glucose reference range applies only to samples taken after fasting for at least 8 hours.   BUN 9 6 - 20 mg/dL   Creatinine, Ser 1.610.66 0.44 - 1.00 mg/dL   Calcium 9.2 8.9 - 09.610.3 mg/dL   Total Protein 7.3 6.5 - 8.1 g/dL   Albumin 4.1 3.5 - 5.0 g/dL   AST 24 15 - 41 U/L   ALT 13 0 - 44 U/L   Alkaline Phosphatase 51 38 - 126 U/L   Total Bilirubin 0.5 0.3 - 1.2 mg/dL   GFR, Estimated >04>60 >54>60 mL/min    Comment: (NOTE) Calculated using the CKD-EPI Creatinine Equation (2021)    Anion gap 7 5 - 15    Comment: Performed at [facility], [address] [place], [place] [address]  hCG, quantitative, pregnancy     Status: None   Collection Time: 10/20/20  6:10 AM  Result Value Ref Range   hCG, Beta Chain, Quant, S 1 <5 mIU/mL    Comment:          GEST. AGE      CONC.  (mIU/mL)   <=1 WEEK        5 - 50     2 WEEKS       50 - 500     3 WEEKS       100 - 10,000     4 WEEKS     1,000 - 30,000     5 WEEKS     3,500 - 115,000   6-8 WEEKS     12,000 - 270,000    12 WEEKS     15,000 - 220,000        FEMALE AND NON-PREGNANT FEMALE:     LESS THAN 5 mIU/mL Performed at  [facility], [address] [place], [place] [address]   Hepatic function panel     Status: None   Collection Time: 10/20/20  6:10 AM  Result Value Ref Range   Total Protein 7.3 6.5 - 8.1 g/dL   Albumin 4.1 3.5 - 5.0 g/dL   AST 24 15 - 41 U/L   ALT 11 0 - 44 U/L   Alkaline Phosphatase 49 38 - 126 U/L   Total Bilirubin 0.4 0.3 - 1.2 mg/dL   Bilirubin, Direct <2.9<0.1 0.0 - 0.2 mg/dL   Indirect Bilirubin NOT CALCULATED 0.3 - 0.9 mg/dL    Comment: Performed at [facility], [address] [place], [place] [address]  Lipase, blood     Status: None   Collection Time: 10/20/20  6:10 AM  Result Value Ref Range   Lipase 50 11 - 51 U/L    Comment: Performed at [facility], [address] [place], [place] [address]  Resp Panel by RT-PCR (Flu A&B, Covid) Nasopharyngeal Swab     Status: None   Collection Time: 10/20/20  9:23 AM   Specimen: Nasopharyngeal Swab; Nasopharyngeal(NP) swabs in vial transport medium  Result Value Ref Range   SARS Coronavirus 2 by RT PCR NEGATIVE NEGATIVE    Comment: (NOTE) SARS-CoV-2 target nucleic acids are NOT DETECTED.  The SARS-CoV-2 RNA is generally detectable in upper  respiratory specimens during the acute phase of infection. The lowest concentration of SARS-CoV-2 viral copies this assay can detect is 138 copies/mL. A negative result does not preclude SARS-Cov-2 infection and should not be used as the sole basis for treatment or other patient management decisions. A negative result may occur with  improper specimen collection/handling, submission of specimen other than nasopharyngeal swab, presence of viral mutation(s) within the areas targeted by this assay, and inadequate number of viral copies(<138 copies/mL). A negative result must be combined with clinical observations, patient history, and epidemiological information. The expected result is Negative.  Fact Sheet for Patients:   BloggerCourse.com  Fact Sheet for Healthcare Providers:  SeriousBroker.it  This test is no t yet approved or cleared by the [place] FDA and  has been authorized for detection and/or diagnosis of SARS-CoV-2 by FDA under an Emergency Use Authorization (EUA). This EUA will remain  in effect (meaning this test can be used) for the duration of the COVID-19 declaration under Section 564(b)(1) of the Act, 21 U.S.C.section 360bbb-3(b)(1), unless the authorization is terminated  or revoked sooner.       Influenza A by PCR NEGATIVE NEGATIVE   Influenza B by PCR NEGATIVE NEGATIVE    Comment: (NOTE) The Xpert Xpress SARS-CoV-2/FLU/RSV plus assay is intended as an aid in the diagnosis of influenza from Nasopharyngeal swab specimens and should not be used as a sole basis for treatment. Nasal washings and aspirates are unacceptable for Xpert Xpress SARS-CoV-2/FLU/RSV testing.  Fact Sheet for Patients: BloggerCourse.com  Fact Sheet for Healthcare Providers: SeriousBroker.it  This test is not yet approved or cleared by the [place] FDA and has been authorized for detection and/or diagnosis of SARS-CoV-2 by FDA under an Emergency Use Authorization (EUA). This EUA will remain in effect (meaning this test can be used) for the duration of the COVID-19 declaration under Section 564(b)(1) of the Act, 21 U.S.C. section 360bbb-3(b)(1), unless the authorization is terminated or revoked.  Performed at [facility], [address] [place], [place] [address]   Urinalysis, Complete w Microscopic     Status: Abnormal   Collection Time: 10/20/20  9:23 AM  Result Value Ref Range   Color, Urine COLORLESS (A) YELLOW   APPearance CLEAR (A) CLEAR   Specific Gravity, Urine 1.003 (L) 1.005 - 1.030   pH 6.0 5.0 - 8.0   Glucose, UA NEGATIVE NEGATIVE mg/dL   Hgb urine dipstick NEGATIVE  NEGATIVE   Bilirubin Urine NEGATIVE NEGATIVE   Ketones, ur NEGATIVE NEGATIVE mg/dL   Protein, ur NEGATIVE NEGATIVE mg/dL   Nitrite NEGATIVE NEGATIVE   Leukocytes,Ua NEGATIVE NEGATIVE   RBC / HPF 0-5 0 - 5 RBC/hpf   WBC, UA 0-5 0 - 5 WBC/hpf   Bacteria, UA NONE SEEN NONE SEEN   Squamous Epithelial / LPF 0-5 0 - 5   Mucus PRESENT     Comment: Performed at [facility], [address] [place], [place] [address]    No current facility-administered medications for this encounter.   Current Outpatient Medications  Medication Sig Dispense Refill   acetaminophen (TYLENOL) 500 MG tablet Take 500-1,000 mg by mouth every 6 (six) hours as needed for mild pain, moderate pain or headache.     escitalopram (LEXAPRO) 5 MG tablet Take 5 mg by mouth daily.     LORazepam (ATIVAN) 1 MG tablet Take 0.5 mg by mouth every 8 (eight) hours as needed for anxiety.      Musculoskeletal: Strength & Muscle Tone: within  normal limits Gait & Station: normal Patient leans: N/A  Psychiatric Specialty Exam: Physical Exam Vitals and nursing note reviewed.  Constitutional:      Appearance: She is well-developed.  HENT:     Head: Normocephalic.  Musculoskeletal:        General: Normal range of motion.     Cervical back: Normal range of motion.  Neurological:     Mental Status: She is alert.  Psychiatric:        Attention and Perception: Attention and perception normal.        Mood and Affect: Mood is anxious.        Speech: Speech normal.        Behavior: Behavior normal. Behavior is cooperative.        Thought Content: Thought content normal.        Cognition and Memory: Cognition and memory normal.        Judgment: Judgment normal.    Review of Systems  Psychiatric/Behavioral:  The patient is nervous/anxious.   All other systems reviewed and are negative.  Blood pressure 129/75, pulse 61, temperature 98.6 F (37 C), temperature source Oral, resp. rate 18, height  (1.626 m), weight  55.8 kg, last menstrual period 10/12/2020, SpO2 99 %.Body mass index is 21.11 kg/m.  General Appearance: Casual  Eye Contact:  Good  Speech:  Normal Rate  Volume:  Normal  Mood:  Anxious  Affect:  Congruent  Thought Process:  Coherent and Descriptions of Associations: Intact  Orientation:  Full (Time, Place, and Person)  Thought Content:  WDL and Logical  Suicidal Thoughts:  No  Homicidal Thoughts:  No  Memory:  Immediate;   Good Recent;   Good Remote;   Good  Judgement:  Good  Insight:  Good  Psychomotor Activity:  Normal  Concentration:  Concentration: Good and Attention Span: Good  Recall:  Good  Fund of Knowledge:  Good  Language:  Good  Akathisia:  No  Handed:  Right  AIMS (if indicated):     Assets:  Housing Leisure Time Physical Health Resilience Social Support  ADL's:  Intact  Cognition:  WNL  Sleep:        Physical Exam: Physical Exam Vitals and nursing note reviewed.  Constitutional:      Appearance: She is well-developed.  HENT:     Head: Normocephalic.  Musculoskeletal:        General: Normal range of motion.     Cervical back: Normal range of motion.  Neurological:     Mental Status: She is alert.  Psychiatric:        Attention and Perception: Attention and perception normal.        Mood and Affect: Mood is anxious.        Speech: Speech normal.        Behavior: Behavior normal. Behavior is cooperative.        Thought Content: Thought content normal.        Cognition and Memory: Cognition and memory normal.        Judgment: Judgment normal.   Review of Systems  Psychiatric/Behavioral:  The patient is nervous/anxious.   All other systems reviewed and are negative. Blood pressure 129/75, pulse 61, temperature 98.6 F (37 C), temperature source Oral, resp. rate 18, height  (1.626 m), weight 55.8 kg, last menstrual period 10/12/2020, SpO2 99 %. Body mass index is 21.11 kg/m.  Treatment Plan Summary: General anxiety disorder: -Follow up  with [facility] and psychiatrist -  Attend IOP  Disposition: No evidence of imminent risk to self or others at present.   Patient does not meet criteria for psychiatric inpatient admission.  Nanine Means, NP 10/20/2020 3:02 PM

## 2020-10-20 NOTE — Discharge Instructions (Signed)
Follow up with [facility] IOP, resources provided

## 2020-10-21 ENCOUNTER — Encounter: Payer: PRIVATE HEALTH INSURANCE | Admitting: Physical Therapy

## 2020-10-23 ENCOUNTER — Ambulatory Visit: Payer: Self-pay | Attending: Family Medicine | Admitting: Physical Therapy

## 2020-10-23 ENCOUNTER — Encounter: Payer: Self-pay | Admitting: Physical Therapy

## 2020-10-23 ENCOUNTER — Other Ambulatory Visit: Payer: Self-pay

## 2020-10-23 DIAGNOSIS — M47812 Spondylosis without myelopathy or radiculopathy, cervical region: Secondary | ICD-10-CM | POA: Insufficient documentation

## 2020-10-23 DIAGNOSIS — M542 Cervicalgia: Secondary | ICD-10-CM | POA: Insufficient documentation

## 2020-10-23 DIAGNOSIS — M6281 Muscle weakness (generalized): Secondary | ICD-10-CM | POA: Insufficient documentation

## 2020-10-23 DIAGNOSIS — G8929 Other chronic pain: Secondary | ICD-10-CM | POA: Insufficient documentation

## 2020-10-23 DIAGNOSIS — M545 Low back pain, unspecified: Secondary | ICD-10-CM | POA: Insufficient documentation

## 2020-10-28 ENCOUNTER — Ambulatory Visit: Payer: PRIVATE HEALTH INSURANCE | Admitting: Family Medicine

## 2020-10-29 ENCOUNTER — Other Ambulatory Visit: Payer: Self-pay

## 2020-10-29 ENCOUNTER — Encounter: Payer: Self-pay | Admitting: Gastroenterology

## 2020-10-29 ENCOUNTER — Ambulatory Visit (INDEPENDENT_AMBULATORY_CARE_PROVIDER_SITE_OTHER): Payer: Self-pay | Admitting: Gastroenterology

## 2020-10-29 VITALS — BP 125/85 | HR 71 | Temp 99.5°F | Ht 64.0 in | Wt 121.2 lb

## 2020-10-29 DIAGNOSIS — D508 Other iron deficiency anemias: Secondary | ICD-10-CM

## 2020-10-29 DIAGNOSIS — R1084 Generalized abdominal pain: Secondary | ICD-10-CM

## 2020-10-29 NOTE — Progress Notes (Signed)
Wyline Mood MD, MRCP(U.K) [address]  [address]  [place], [place] [address]  Main: [phone]  Fax: [phone]   Gastroenterology Consultation  Referring Provider:     Dr Smith Robert Primary Care Physician:  Jerrol Banana, MD Primary Gastroenterologist:  Dr. Wyline Mood  Reason for Consultation:     Iron deficiency anemia        HPI:   Robin Schwartz is a 50 y.o. y/o female referred for iron deficiency anemia by Dr.  She has been a patient of [facility] last seen at their office in February 2022 for upper abdominal pain.  She had an EGD and colonoscopy including biopsies of the duodenum negative for celiac disease and no evidence of eosinophilic esophagitis.  At [facility] on 12/20/2019 which were both unremarkable 04/02/2020 HIDA scan ejection fraction 97% right upper quadrant ultrasound in November 2021 showed no gross abnormality.  #2585 CT scan of the abdomen pelvis showed mild to moderate rectal and moderate ascending colon marked stool burden.  She was told that her upper abdominal symptoms could be due to GERD and constipation.  She sees Dr. Smith Robert for  iron deficiency anemia.  Presently does not require any IV iron.  She has been referred to [place] to discuss about a possible capsule study of small bowel.  I will also check complete 10-day course of vancomycin for C. difficile diarrhea overall she would have to stop drinking alcohol otherwise 10/20/2020 hepatic function panel normal, lipase normal, CMP no gross abnormality, hemoglobin 12.6 g.  Ferritin 289.  She was over 20 minutes late in a 30-minute appointment.  She states that she does not have any overt bleeding.  Denies any regular NSAID use.  Complains of generalized abdominal pain.  Does not have a bowel movement on a daily basis.  Her last bowel movement was over 5 days back.  Has taken MiraLAX in the past.  Has helped at times but does not take it regularly.  She states her appetite is poor has nausea.  Denies  any vomiting.  She has lost some weight.  She has been suggested to undergo a CAT scan.  Past Medical History:  Diagnosis Date   Anemia    Anxiety     Past Surgical History:  Procedure Laterality Date   BREAST EXCISIONAL BIOPSY Left 1992   CYSTOSCOPY  2003   LIPOMA EXCISION Left 2007   shoulder    Prior to Admission medications   Medication Sig Start Date End Date Taking? Authorizing Provider  acetaminophen (TYLENOL) 500 MG tablet Take 500-1,000 mg by mouth every 6 (six) hours as needed for mild pain, moderate pain or headache.    [provider]  LORazepam (ATIVAN) 1 MG tablet Take 0.5 mg by mouth every 8 (eight) hours as needed for anxiety.    [provider]  meloxicam (MOBIC) 7.5 MG tablet Take by mouth. 10/28/20 11/27/20  [provider]    Family History  Problem Relation Age of Onset   Stroke Mother    COPD Maternal Aunt    Hypertension Maternal Uncle    Asthma Maternal Uncle    COPD Maternal Uncle    Diabetes Maternal Grandmother    Asthma Maternal Grandmother    Cancer Maternal Grandfather    Colon cancer Maternal Grandfather      Social History   Tobacco Use   Smoking status: Never   Smokeless tobacco: Never  Vaping Use   Vaping Use: Never used  Substance  Use Topics   Alcohol use: Not Currently   Drug use: Never    Allergies as of 10/29/2020 - Review Complete 10/23/2020  Allergen Reaction Noted   Contrast media [iodinated diagnostic agents] Itching 02/15/2020   Hydrocodone Other (See Comments) 04/12/2019   Propoxyphene Rash and Other (See Comments) 06/09/2011    Review of Systems:    All systems reviewed and negative except where noted in HPI.   Physical Exam:  LMP 10/12/2020  Patient's last menstrual period was 10/12/2020. Psych:  Alert and cooperative. Normal mood and affect. General:   Alert,  Well-developed, well-nourished, pleasant and cooperative in NAD Head:  Normocephalic and atraumatic. Eyes:  Sclera clear, no  icterus.   Conjunctiva pink. Ears:  Normal auditory acuity. Lungs:  Respirations even and unlabored.  Clear throughout to auscultation.   No wheezes, crackles, or rhonchi. No acute distress. Heart:  Regular rate and rhythm; no murmurs, clicks, rubs, or gallops. Abdomen:  Normal bowel sounds.  No bruits.  Soft, non-tender and non-distended without masses, hepatosplenomegaly or hernias noted.  No guarding or rebound tenderness.    Neurologic:  Alert and oriented x3;  grossly normal neurologically. Psych:  Alert and cooperative. Normal mood and affect.  Imaging Studies: DG Chest 2 View  Result Date: 10/20/2020 CLINICAL DATA:  Chest pain and shortness of breath. EXAM: CHEST - 2 VIEW COMPARISON:  08/14/2020 FINDINGS: Lungs are hyperexpanded. Right lung clear. Similar appearance of a bandlike opacity over the left upper lung., presumably scar. The cardiopericardial silhouette is within normal limits for size. The visualized bony structures of the thorax show no acute abnormality. IMPRESSION: No active cardiopulmonary disease. Electronically Signed   By: Kennith Center M.D.   On: 10/20/2020 06:44   US ABDOMEN LIMITED RUQ (LIVER/GB)  Result Date: 10/20/2020 CLINICAL DATA:  Right upper quadrant abdominal pain for 1 month. EXAM: ULTRASOUND ABDOMEN LIMITED RIGHT UPPER QUADRANT COMPARISON:  None. FINDINGS: Gallbladder: No gallstones or wall thickening visualized. No sonographic Murphy sign noted by sonographer. Common bile duct: Diameter: 1 mm which is within normal limits. Liver: No focal lesion identified. Within normal limits in parenchymal echogenicity. Portal vein is patent on color Doppler imaging with normal direction of blood flow towards the liver. Other: None. IMPRESSION: No definite abnormality seen in the right upper quadrant of the abdomen. Electronically Signed   By: Lupita Raider M.D.   On: 10/20/2020 08:26    Assessment and Plan:   Robin Schwartz is a 50 y.o. y/o female who has previously  been seen and evaluated by [facility], obtained a second opinion at [facility] for abdominal pain and was diagnosed with GERD and possible constipation.  She is being followed by hematology for iron deficiency anemia and has required IV iron in the past.  She has had an EGD and colonoscopy with no gross abnormalities noted.  And been referred to see me to determine if she requires a capsule study of small bowel.  We will perform a capsule study of small bowel to complete evaluation of iron deficiency anemia.  She does suffer from severe constipation.  Not been on any regular therapy.  I will commence her on Trulance we will give her to 6-week sample of medication.  She should call me back if it works or prescription.  I would also give her patient information about high-fiber diet.  Explained that she should consume at least 30 g of fiber per day.  To have a good bowel movement.  Her abdominal pain  could be related to the constipation or it could be functional in nature as she has had extensive evaluation in the past.  I will also give her samples of IBgard for functional pain to see if it helps her.  If at her next visit symptoms are no better could consider a gastric emptying study and pain modulator such as amitriptyline.    Follow up in 4-5 weeks  Dr Wyline Mood MD,MRCP(U.K)

## 2020-10-29 NOTE — Patient Instructions (Addendum)
Please start taking Trulance once a day for 2 weeks and then let us know if it works so Dr. Tobi Bastos could write you a prescription. Take IBGuard daily for two weeks and then stop.   High-Fiber Eating Plan Fiber, also called dietary fiber, is a type of carbohydrate. It is found foods such as fruits, vegetables, whole grains, and beans. A high-fiber diet can have many health benefits. Your health care provider may recommend a high-fiber diet to help: Prevent constipation. Fiber can make your bowel movements more regular. Lower your cholesterol. Relieve the following conditions: Inflammation of veins in the anus (hemorrhoids). Inflammation of specific areas of the digestive tract (uncomplicated diverticulosis). A problem of the large intestine, also called the colon, that sometimes causes pain and diarrhea (irritable bowel syndrome, or IBS). Prevent overeating as part of a weight-loss plan. Prevent heart disease, type 2 diabetes, and certain cancers. What are tips for following this plan? Reading food labels  Check the nutrition facts label on food products for the amount of dietary fiber. Choose foods that have 5 grams of fiber or more per serving. The goals for recommended daily fiber intake include: Men (age 9 or younger): 34-38 g. Men (over age 58): 28-34 g. Women (age 10 or younger): 25-28 g. Women (over age 32): 22-25 g. Your daily fiber goal is _____________ g. Shopping Choose whole fruits and vegetables instead of processed forms, such as apple juice or applesauce. Choose a wide variety of high-fiber foods such as avocados, lentils, oats, and kidney beans. Read the nutrition facts label of the foods you choose. Be aware of foods with added fiber. These foods often have high sugar and sodium amounts per serving. Cooking Use whole-grain flour for baking and cooking. Cook with brown rice instead of white rice. Meal planning Start the day with a breakfast that is high in fiber, such as  a cereal that contains 5 g of fiber or more per serving. Eat breads and cereals that are made with whole-grain flour instead of refined flour or white flour. Eat brown rice, bulgur wheat, or millet instead of white rice. Use beans in place of meat in soups, salads, and pasta dishes. Be sure that half of the grains you eat each day are whole grains. General information You can get the recommended daily intake of dietary fiber by: Eating a variety of fruits, vegetables, grains, nuts, and beans. Taking a fiber supplement if you are not able to take in enough fiber in your diet. It is better to get fiber through food than from a supplement. Gradually increase how much fiber you consume. If you increase your intake of dietary fiber too quickly, you may have bloating, cramping, or gas. Drink plenty of water to help you digest fiber. Choose high-fiber snacks, such as berries, raw vegetables, nuts, and popcorn. What foods should I eat? Fruits Berries. Pears. Apples. Oranges. Avocado. Prunes and raisins. Dried figs. Vegetables Sweet potatoes. Spinach. Kale. Artichokes. Cabbage. Broccoli. Cauliflower. Green peas. Carrots. Squash. Grains Whole-grain breads. Multigrain cereal. Oats and oatmeal. Brown rice. Barley. Bulgur wheat. Millet. Quinoa. Bran muffins. Popcorn. Rye wafer crackers. Meats and other proteins Navy beans, kidney beans, and pinto beans. Soybeans. Split peas. Lentils. Nuts and seeds. Dairy Fiber-fortified yogurt. Beverages Fiber-fortified soy milk. Fiber-fortified orange juice. Other foods Fiber bars. The items listed above may not be a complete list of recommended foods and beverages. Contact a dietitian for more information. What foods should I avoid? Fruits Fruit juice. Cooked, strained fruit. Vegetables [place]  potatoes. Canned vegetables. Well-cooked vegetables. Grains White bread. Pasta made with refined flour. White rice. Meats and other proteins Fatty cuts of meat. Fried  chicken or fried fish. Dairy Milk. Yogurt. Cream cheese. Sour cream. Fats and oils Butters. Beverages Soft drinks. Other foods Cakes and pastries. The items listed above may not be a complete list of foods and beverages to avoid. Talk with your dietitian about what choices are best for you. Summary Fiber is a type of carbohydrate. It is found in foods such as fruits, vegetables, whole grains, and beans. A high-fiber diet has many benefits. It can help to prevent constipation, lower blood cholesterol, aid weight loss, and reduce your risk of heart disease, diabetes, and certain cancers. Increase your intake of fiber gradually. Increasing fiber too quickly may cause cramping, bloating, and gas. Drink plenty of water while you increase the amount of fiber you consume. The best sources of fiber include whole fruits and vegetables, whole grains, nuts, seeds, and beans. This information is not intended to replace advice given to you by your health care provider. Make sure you discuss any questions you have with your health care provider. Document Revised: 06/14/2019 Document Reviewed: 06/14/2019 Elsevier Patient Education  2022 [employer].

## 2020-10-30 LAB — H. PYLORI BREATH TEST: H pylori Breath Test: NEGATIVE

## 2020-10-30 NOTE — Therapy (Signed)
[facility] [address] [place], [place], [address] Phone: [phone]   Fax:  [phone]  Physical Therapy Treatment  Patient Details  Name: Robin Schwartz MRN: [id number] Date of Birth: [birth year] Referring Provider (PT): Dr. Rosette Reveal   Encounter Date: 10/23/2020   PT End of Session - 10/30/20 0818     Visit Number 11    Number of Visits 16    Date for PT Re-Evaluation 11/06/20    Authorization - Visit Number 1    Authorization - Number of Visits 10    PT Start Time 1962    PT Stop Time 1037    PT Time Calculation (min) 49 min    Activity Tolerance Patient tolerated treatment well    Behavior During Therapy [facility] for tasks assessed/performed             Past Medical History:  Diagnosis Date   Anemia    Anxiety     Past Surgical History:  Procedure Laterality Date   BREAST EXCISIONAL BIOPSY Left 1992   CYSTOSCOPY  2003   LIPOMA EXCISION Left 2007   shoulder    There were no vitals filed for this visit.   Subjective Assessment - 10/30/20 0815     Subjective Pt. entered PT with no c/o neck or back pain today.  Pt. states she has been to ER 2x since last PT visit due to side effects from new anxiety medications.  PT spoke in depth with pts. new symptoms and discussed financial assistance for future appts.  PT encouraged pt. to focus on daily HEP.    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 No/denies               Ther.ex.:   Seated B shoulder press-ups 2# 20x2.  Bicep curls 2# 20x2.   Supine B shoulder flexion 2#/ horizontal abduction 2#/ press-ups 2# 20x.   Supine core ex. Marching/ bridging/ hip abduction/ trunk rotn.- challenging/ good TrA control.   Supine knee to chest/ hamstring stretches.      Manual tx:   Supine cervical traction 3x with static holds/ L and R UT  and levator manual stretches 3x20 sec. Supine STM to L/R UT musculature/ shoulders (no c/o neck or scapular pain)        PT Long Term Goals - 10/20/20 2297       PT LONG TERM GOAL #1   Title Pt. will increase FOTO to 59 to improve pain-free functional mobility.    Baseline Initial FOTO on 7/20:  51.  8/18: 66    Time 4    Period Weeks    Status Achieved    Target Date 10/09/20      PT LONG TERM GOAL #2   Title Pt. will increase cervical AROM to [facility] with no increase c/o upper trap./scapular pain to improve mobility.    Baseline Cervical AROM: flexion 45 deg./ extension 35 deg./ L rotn. 50 deg./ R rotn. 43 deg. (pain)/ L lat. flex. 34 deg./ R 28 deg. (increase pain).  8/18:  Cervical AROM: flexion 48 deg./ extension 44 deg./ L lat. Flexion 36 deg./ R lat. Flexion 33 deg. R sided neck pain/ pinching reported)/ L rotn. (58 deg.)/ R rotn. (52 deg.)    Time 4    Period Weeks    Status Partially Met    Target Date 11/06/20  PT LONG TERM GOAL #3   Title Pt. 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    Baseline Increase c/o back pain with prolonged tasks.    Time 4    Period Weeks    Status Partially Met    Target Date 11/06/20      PT LONG TERM GOAL #4   Title Pt. will increase B UE/ core strength 1/2 muscle grade to improve upright posture/ pain-free mobility.    Baseline B UE strength grossly 4+/5 MMT. Lumbar AROM WFL and B knee AROM WFL    Time 4    Period Weeks    Status Partially Met    Target Date 11/06/20                   Plan - 10/30/20 0818     Clinical Impression Statement Tx session limited due to discussion about recent ER visits and mental health.  Pt. reports no neck or back pain during tx. session and PT reviewed core based/ UE resisted ther.ex.  Pt. will continue with current HEP and instructed to contact PT over next week to discuss scheduling.  Pt. has no more financial  authorization at this time.  Pt. will benefit from a consistent gym based/ HEP.    Stability/Clinical Decision Making Evolving/Moderate complexity    Clinical Decision Making Moderate    Rehab Potential Fair    PT Frequency 2x / week    PT Duration 4 weeks    PT Treatment/Interventions ADLs/Self Care Home Management;Aquatic Therapy;Cryotherapy;Moist Heat;Electrical Stimulation;Functional mobility training;Neuromuscular re-education;Balance training;Therapeutic exercise;Therapeutic activities;Patient/family education;Manual techniques;Passive range of motion;Dry needling;Spinal Manipulations    PT Next Visit Plan Pt. instructed to contact PT if any questions or issues.    PT Home Exercise Plan Access Code: [email or web address]    Consulted and Agree with Plan of Care Patient             Patient will benefit from skilled therapeutic intervention in order to improve the following deficits and impairments:  Pain, Postural dysfunction, Impaired flexibility, Decreased strength, Decreased activity tolerance, Decreased range of motion, Improper body mechanics, Difficulty walking, Decreased mobility, Decreased endurance  Visit Diagnosis: Cervical spondylosis  Chronic bilateral low back pain without sciatica  Muscle weakness (generalized)  Neck pain     Problem List Patient Active Problem List   Diagnosis Date Noted   Major depressive disorder, recurrent episode, moderate (HCC) 10/20/2020   Panic disorder 10/20/2020   BMI 20.0-20.9, adult 10/02/2020   Chronic fatigue 10/02/2020   Fever of unknown origin (FUO) 09/11/2020   Bilateral primary osteoarthritis of knee 09/09/2020   Weight loss, unintentional 09/09/2020   Cervical spondylosis 09/01/2020   Anemia 08/19/2020   Cervical paraspinous muscle spasm 08/19/2020   Thickened endometrium 08/14/2020   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   Iron deficiency anemia 05/02/2020   Seasonal allergies 06/07/2019   Dysmenorrhea 02/21/2019   MEE (middle ear  effusion), bilateral 02/21/2019   Cervical stenosis of spine 06/08/2018   Hand joint pain 06/08/2018   Positive antinuclear antibody 06/08/2018   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   Chronic abdominal pain 10/10/2014   Headache disorder 12/12/2013   Patellofemoral dysfunction 11/15/2013   Stress incontinence 08/10/2013   Lumbosacral pain 08/09/2013   Bursitis of both hips 08/09/2013   Other bursitis of hip, unspecified hip 08/09/2013   H/O vitamin D deficiency 06/23/2013   Personal history of other endocrine, nutritional and metabolic disease 91/14/3551  Iliotibial band syndrome 05/31/2013   Irritable bowel syndrome with constipation 04/27/2013   Gastroesophageal reflux disease 04/04/2013   Eustachian tube dysfunction 11/29/2012   Lesion of sciatic nerve 09/27/2012   Piriformis syndrome 09/27/2012   Generalized anxiety disorder 03/28/2012   Pura Spice, PT, DPT # [id number] 10/30/2020, 8:44 AM  [facility] [address] [place], [place], [address] Phone: [phone]   Fax:  [phone]  Name: Robin Schwartz MRN: [id number] Date of Birth: 04-Jul-1970

## 2020-11-18 ENCOUNTER — Telehealth: Payer: Self-pay | Admitting: Gastroenterology

## 2020-11-18 ENCOUNTER — Telehealth: Payer: Self-pay

## 2020-11-18 NOTE — Telephone Encounter (Signed)
Capsule study canceled due to incorrect insurance information

## 2020-11-18 NOTE — Telephone Encounter (Signed)
LVM-Called patient on 11/17/20 4:30pm to ask for updated insurance. No response from patient..[facility] does not except family planning Medicaid.

## 2020-11-19 ENCOUNTER — Ambulatory Visit: Admission: RE | Admit: 2020-11-19 | Payer: Medicaid Other | Source: Home / Self Care | Admitting: Gastroenterology

## 2020-11-19 ENCOUNTER — Encounter: Admission: RE | Payer: Self-pay | Source: Home / Self Care

## 2020-11-19 SURGERY — IMAGING PROCEDURE, GI TRACT, INTRALUMINAL, VIA CAPSULE
Anesthesia: General

## 2020-11-25 ENCOUNTER — Other Ambulatory Visit: Payer: Self-pay

## 2020-11-26 ENCOUNTER — Encounter: Payer: Self-pay | Admitting: Gastroenterology

## 2020-11-26 ENCOUNTER — Ambulatory Visit: Payer: Self-pay | Admitting: Gastroenterology

## 2021-01-06 ENCOUNTER — Encounter: Payer: Self-pay | Admitting: Oncology

## 2021-01-06 ENCOUNTER — Telehealth: Payer: Self-pay | Admitting: Oncology

## 2021-01-06 NOTE — Telephone Encounter (Signed)
Robin Schwartz, just an [place]. I left a VM to get her lab appt from 11/17 rescheduled.

## 2021-01-06 NOTE — Telephone Encounter (Signed)
Pt called in to reschedule her appt. Please give her a call at [phone]

## 2021-01-08 ENCOUNTER — Inpatient Hospital Stay: Payer: Medicaid Other | Attending: Oncology

## 2021-01-08 ENCOUNTER — Other Ambulatory Visit: Payer: Self-pay

## 2021-01-08 DIAGNOSIS — D508 Other iron deficiency anemias: Secondary | ICD-10-CM | POA: Insufficient documentation

## 2021-01-08 LAB — IRON AND TIBC
Iron: 77 ug/dL (ref 28–170)
Saturation Ratios: 26 % (ref 10.4–31.8)
TIBC: 294 ug/dL (ref 250–450)
UIBC: 217 ug/dL

## 2021-01-08 LAB — CBC WITH DIFFERENTIAL/PLATELET
Abs Immature Granulocytes: 0.01 10*3/uL (ref 0.00–0.07)
Basophils Absolute: 0.1 10*3/uL (ref 0.0–0.1)
Basophils Relative: 1 %
Eosinophils Absolute: 0.4 10*3/uL (ref 0.0–0.5)
Eosinophils Relative: 10 %
HCT: 38.5 % (ref 36.0–46.0)
Hemoglobin: 12.1 g/dL (ref 12.0–15.0)
Immature Granulocytes: 0 %
Lymphocytes Relative: 42 %
Lymphs Abs: 1.7 10*3/uL (ref 0.7–4.0)
MCH: 26.1 pg (ref 26.0–34.0)
MCHC: 31.4 g/dL (ref 30.0–36.0)
MCV: 83 fL (ref 80.0–100.0)
Monocytes Absolute: 0.4 10*3/uL (ref 0.1–1.0)
Monocytes Relative: 9 %
Neutro Abs: 1.6 10*3/uL — ABNORMAL LOW (ref 1.7–7.7)
Neutrophils Relative %: 38 %
Platelets: 271 10*3/uL (ref 150–400)
RBC: 4.64 MIL/uL (ref 3.87–5.11)
RDW: 13.4 % (ref 11.5–15.5)
WBC: 4.2 10*3/uL (ref 4.0–10.5)
nRBC: 0 % (ref 0.0–0.2)

## 2021-01-08 LAB — FERRITIN: Ferritin: 82 ng/mL (ref 11–307)

## 2021-01-28 ENCOUNTER — Emergency Department (HOSPITAL_COMMUNITY): Payer: Self-pay

## 2021-01-28 ENCOUNTER — Encounter (HOSPITAL_COMMUNITY): Payer: Self-pay | Admitting: Emergency Medicine

## 2021-01-28 ENCOUNTER — Emergency Department (HOSPITAL_COMMUNITY)
Admission: EM | Admit: 2021-01-28 | Discharge: 2021-01-28 | Disposition: A | Discharge status: Home / Self Care | Payer: Self-pay | Attending: Medical | Admitting: Medical

## 2021-01-28 ENCOUNTER — Other Ambulatory Visit: Payer: Self-pay

## 2021-01-28 DIAGNOSIS — R42 Dizziness and giddiness: Secondary | ICD-10-CM | POA: Insufficient documentation

## 2021-01-28 DIAGNOSIS — R519 Headache, unspecified: Secondary | ICD-10-CM | POA: Insufficient documentation

## 2021-01-28 DIAGNOSIS — Z5321 Procedure and treatment not carried out due to patient leaving prior to being seen by health care provider: Secondary | ICD-10-CM | POA: Insufficient documentation

## 2021-01-28 DIAGNOSIS — R072 Precordial pain: Secondary | ICD-10-CM | POA: Insufficient documentation

## 2021-01-28 LAB — CBC WITH DIFFERENTIAL/PLATELET
Abs Immature Granulocytes: 0.01 10*3/uL (ref 0.00–0.07)
Basophils Absolute: 0.1 10*3/uL (ref 0.0–0.1)
Basophils Relative: 1 %
Eosinophils Absolute: 0.4 10*3/uL (ref 0.0–0.5)
Eosinophils Relative: 7 %
HCT: 38 % (ref 36.0–46.0)
Hemoglobin: 11.7 g/dL — ABNORMAL LOW (ref 12.0–15.0)
Immature Granulocytes: 0 %
Lymphocytes Relative: 40 %
Lymphs Abs: 2.2 10*3/uL (ref 0.7–4.0)
MCH: 26.1 pg (ref 26.0–34.0)
MCHC: 30.8 g/dL (ref 30.0–36.0)
MCV: 84.6 fL (ref 80.0–100.0)
Monocytes Absolute: 0.5 10*3/uL (ref 0.1–1.0)
Monocytes Relative: 9 %
Neutro Abs: 2.4 10*3/uL (ref 1.7–7.7)
Neutrophils Relative %: 43 %
Platelets: 308 10*3/uL (ref 150–400)
RBC: 4.49 MIL/uL (ref 3.87–5.11)
RDW: 13.5 % (ref 11.5–15.5)
WBC: 5.6 10*3/uL (ref 4.0–10.5)
nRBC: 0 % (ref 0.0–0.2)

## 2021-01-28 LAB — TROPONIN I (HIGH SENSITIVITY): Troponin I (High Sensitivity): 6 ng/L (ref <18)

## 2021-01-28 LAB — I-STAT BETA HCG BLOOD, ED (MC, WL, AP ONLY): I-stat hCG, quantitative: <5 m[IU]/mL (ref <5)

## 2021-01-28 LAB — BASIC METABOLIC PANEL
Anion gap: 9 (ref 5–15)
BUN: 12 mg/dL (ref 6–20)
CO2: 25 mmol/L (ref 22–32)
Calcium: 9.1 mg/dL (ref 8.9–10.3)
Chloride: 104 mmol/L (ref 98–111)
Creatinine, Ser: 0.7 mg/dL (ref 0.44–1.00)
GFR, Estimated: >60 mL/min (ref >60)
Glucose, Bld: 96 mg/dL (ref 70–99)
Potassium: 4 mmol/L (ref 3.5–5.1)
Sodium: 138 mmol/L (ref 135–145)

## 2021-01-28 NOTE — ED Triage Notes (Addendum)
Patient here with complaint of chest pressure and headache that started approximately one and a half weeks ago. Patient denies nausea, and vomiting but also reports generalized weakness and intermittent shortness of breath. Patient alert, oriented, and in no apparent distress at this time.

## 2021-01-28 NOTE — ED Notes (Signed)
PT decided to

## 2021-01-28 NOTE — ED Provider Notes (Signed)
Emergency Medicine Provider Triage Evaluation Note  Robin Schwartz , a 50 y.o. female  was evaluated in triage.  Pt complains of constant substernal chest pressure for the past 1.5 weeks.  Patient also complains of a mild headache and lightheadedness.  She does report history of anemia and states when she ever she gets lightheaded she feels like her iron may be low.  He denies any shortness of breath, nausea, vomiting, diaphoresis.  She is a non-smoker.  She does report extensive family history of heart disease.  She denies any history of DVT or PE.  No recent prolonged travel or immobilization.  No hemoptysis.  No active malignancy.  No exogenous hormone use.  Review of Systems  Positive: + CP, headache, lightheaded Negative: - syncope, nausea, vomiting, SOB  Physical Exam  BP 124/70 (BP Location: Left Arm)   Pulse 65   Temp 99 F (37.2 C) (Oral)   Resp 16   SpO2 100%  Gen:   Awake, no distress   Resp:  Normal effort  MSK:   Moves extremities without difficulty  Other:    Medical Decision Making  Medically screening exam initiated at 6:13 PM.  Appropriate orders placed.  Robin Schwartz was informed that the remainder of the evaluation will be completed by another provider, this initial triage assessment does not replace that evaluation, and the importance of remaining in the ED until their evaluation is complete.     Tanda Rockers, PA-C 01/28/21 1814    Gerhard Munch, MD 01/28/21 1850

## 2021-01-28 NOTE — ED Notes (Signed)
PT decided to leave . 

## 2021-02-04 DIAGNOSIS — R252 Cramp and spasm: Secondary | ICD-10-CM | POA: Insufficient documentation

## 2021-03-11 DIAGNOSIS — G500 Trigeminal neuralgia: Secondary | ICD-10-CM | POA: Insufficient documentation

## 2021-04-07 ENCOUNTER — Other Ambulatory Visit: Payer: Self-pay | Admitting: *Deleted

## 2021-04-07 DIAGNOSIS — D508 Other iron deficiency anemias: Secondary | ICD-10-CM

## 2021-04-15 ENCOUNTER — Ambulatory Visit: Payer: Medicaid Other | Admitting: Oncology

## 2021-04-15 ENCOUNTER — Other Ambulatory Visit: Payer: Medicaid Other

## 2021-05-08 ENCOUNTER — Encounter: Payer: Self-pay | Admitting: Oncology

## 2021-05-12 ENCOUNTER — Ambulatory Visit: Payer: Medicaid Other | Admitting: Oncology

## 2021-05-12 ENCOUNTER — Other Ambulatory Visit: Payer: Medicaid Other

## 2021-05-15 DIAGNOSIS — G4452 New daily persistent headache (NDPH): Secondary | ICD-10-CM | POA: Insufficient documentation

## 2021-05-15 DIAGNOSIS — R29898 Other symptoms and signs involving the musculoskeletal system: Secondary | ICD-10-CM | POA: Insufficient documentation

## 2021-07-17 DIAGNOSIS — R42 Dizziness and giddiness: Secondary | ICD-10-CM | POA: Insufficient documentation

## 2021-07-17 DIAGNOSIS — R200 Anesthesia of skin: Secondary | ICD-10-CM | POA: Insufficient documentation

## 2021-08-03 ENCOUNTER — Other Ambulatory Visit: Payer: Self-pay

## 2021-08-03 ENCOUNTER — Emergency Department
Admission: EM | Admit: 2021-08-03 | Discharge: 2021-08-03 | Disposition: A | Discharge status: Home / Self Care | Payer: Medicaid Other | Attending: Emergency Medicine | Admitting: Emergency Medicine

## 2021-08-03 ENCOUNTER — Emergency Department: Payer: Medicaid Other

## 2021-08-03 DIAGNOSIS — F410 Panic disorder [episodic paroxysmal anxiety] without agoraphobia: Secondary | ICD-10-CM | POA: Insufficient documentation

## 2021-08-03 DIAGNOSIS — R55 Syncope and collapse: Secondary | ICD-10-CM | POA: Insufficient documentation

## 2021-08-03 LAB — BASIC METABOLIC PANEL
Anion gap: 5 (ref 5–15)
BUN: 12 mg/dL (ref 6–20)
CO2: 28 mmol/L (ref 22–32)
Calcium: 9.5 mg/dL (ref 8.9–10.3)
Chloride: 105 mmol/L (ref 98–111)
Creatinine, Ser: 0.66 mg/dL (ref 0.44–1.00)
GFR, Estimated: >60 mL/min (ref >60)
Glucose, Bld: 100 mg/dL — ABNORMAL HIGH (ref 70–99)
Potassium: 3.4 mmol/L — ABNORMAL LOW (ref 3.5–5.1)
Sodium: 138 mmol/L (ref 135–145)

## 2021-08-03 LAB — CBC
HCT: 39.6 % (ref 36.0–46.0)
Hemoglobin: 12.1 g/dL (ref 12.0–15.0)
MCH: 25.3 pg — ABNORMAL LOW (ref 26.0–34.0)
MCHC: 30.6 g/dL (ref 30.0–36.0)
MCV: 82.7 fL (ref 80.0–100.0)
Platelets: 332 10*3/uL (ref 150–400)
RBC: 4.79 MIL/uL (ref 3.87–5.11)
RDW: 13.4 % (ref 11.5–15.5)
WBC: 5.8 10*3/uL (ref 4.0–10.5)
nRBC: 0 % (ref 0.0–0.2)

## 2021-08-03 LAB — TROPONIN I (HIGH SENSITIVITY): Troponin I (High Sensitivity): 9 ng/L (ref <18)

## 2021-08-03 LAB — D-DIMER, QUANTITATIVE: D-Dimer, Quant: 0.48 ug/mL-FEU (ref 0.00–0.50)

## 2021-08-03 NOTE — ED Triage Notes (Signed)
First Nurse NOte:  Arrives via [facility].  Feeling weak and dizzy today. Went to an urgent care, but didn't want to pay co pay.  AOx3.  Ambulatory.  NAD  Diagnosed with anemia 2 weeks ago, haws not taken iron in a week.  VS wnl.

## 2021-08-03 NOTE — ED Provider Triage Note (Signed)
Emergency Medicine Provider Triage Evaluation Note  Robin Schwartz, a 51 y.o. female  was evaluated in triage.  Pt complains of near-syncope.  Patient reports onset 2 hours after eating lunch when she was driving on highway.  She began to feel as if she was about to pass out and feeling distant.  She pulled over to the side of the road, and called EMS.  Patient with a history of panic disorder, reports she has had symptoms consistent with probable panic attack.  She denies any outright syncope, chest pain, shortness of breath.  She also denies any weakness, paralysis, or vision loss.  Review of Systems  Positive: Near-syncope Negative: CP, SOB  Physical Exam  LMP 07/24/2021  Gen:   Awake, no distress  NAD Resp:  Normal effort CTA MSK:   Moves extremities without difficulty  CVS:  RRR  Medical Decision Making  Medically screening exam initiated at 6:27 PM.  Appropriate orders placed.  Robin Schwartz was informed that the remainder of the evaluation will be completed by another provider, this initial triage assessment does not replace that evaluation, and the importance of remaining in the ED until their evaluation is complete.  Patient to the ED with a history of panic disorder, presents to the ED with reports of near syncope.  She reports onset of symptoms today about 2 hours after eating, while she was driving along for appointments.   Lissa Hoard, PA-C 08/03/21 1829

## 2021-08-03 NOTE — ED Provider Notes (Signed)
[facility] Provider Note    Event Date/Time   First MD Initiated Contact with Patient 08/03/21 2044     (approximate)   History   Near Syncope   HPI  Robin Schwartz is a 51 y.o. female who on review of medical records has a history of anemia and anxiety  Reviewed consultation note from 10/20/2020, patient has a history of generalized anxiety disorder as well as reviewed GI note which indicates a history of gastroesophageal reflux and iron deficiency   No chest pain.  Started feeling short of breath while at the office at work she reports like the air conditioner went out and started to feel stuffy in the office.  She went outside and started feeling tingling feeling in her hands and feet and then of breath that progressed feeling of lightheadedness.  She felt short of breath like she could not take a deep breath  She went back in, and decided to drive to urgent care.  When she arrived to urgent care there was a high co-pay, she decided to drive herself to [place] or [facility].  While driving [place] the [place] she started feeling again like short of breath and then as if she was having tunnel vision or a "aura" or strange out of body feeling.  This happened twice the second time she pulled her call over and called 911.  She reports she has had similar symptoms with "panic attack" in the past that feels similar with a closed and euphoric feeling in tingling.  She does suffer from anxiety and takes as needed Ativan but did not take any today  No chest pain.  No leg swelling.  Did travel on an airplane for about an hour and 1/2 to 2 hours from [place] yesterday but also reports while she was on the airplane she felt very similar feeling that lasted briefly but reports she suffers from travel anxiety  Denies pregnancy no abdominal pain.  No nausea or vomiting.  Improved presently.  Took 2 ibuprofen before arrival   Physical Exam   Triage Vital Signs: ED Triage  Vitals  Enc Vitals Group     BP 08/03/21 1827 (!) 124/92     Pulse Rate 08/03/21 1827 75     Resp 08/03/21 1827 18     Temp 08/03/21 1827 98.2 F (36.8 C)     Temp Source 08/03/21 1827 Oral     SpO2 08/03/21 1827 98 %     Weight --      Height --      Head Circumference --      Peak Flow --      Pain Score 08/03/21 1825 0     Pain Loc --      Pain Edu? --      Excl. in GC? --     Most recent vital signs: Vitals:   08/03/21 1827 08/03/21 2042  BP: (!) 124/92 129/77  Pulse: 75 63  Resp: 18 14  Temp: 98.2 F (36.8 C)   SpO2: 98% 98%     General: Awake, no distress.  Very pleasant.  Reading through paperwork and working on iPad. CV:  Good peripheral perfusion.  Normal heart tones rate and rhythm. Resp:  Normal effort.  Clear bilaterally.  Normal work of breathing. Abd:  No distention.  Soft nontender Other:  No unilateral leg swelling no edema no venous cords or congestion lower extremities bilaterally.  Very pleasant, normal neurologic assessment, in no distress.  Does not appear to have any acute ongoing symptomatology at this time   ED Results / Procedures / Treatments   Labs (all labs ordered are listed, but only abnormal results are displayed) Labs Reviewed  BASIC METABOLIC PANEL - Abnormal; Notable for the following components:      Result Value   Potassium 3.4 (*)    Glucose, Bld 100 (*)    All other components within normal limits  CBC - Abnormal; Notable for the following components:   MCH 25.3 (*)    All other components within normal limits  D-DIMER, QUANTITATIVE  TROPONIN I (HIGH SENSITIVITY)     EKG Interpreted by me at 1836 heart rate 70 QRS 80 QTc 415 normal sinus rhythm no evidence of acute ischemia or ectopy   RADIOLOGY DG Chest 2 View  Result Date: 08/03/2021 CLINICAL DATA:  Dyspnea EXAM: CHEST - 2 VIEW COMPARISON:  01/28/2021 FINDINGS: The heart size and mediastinal contours are within normal limits. Both lungs are clear. Stable probable  scarring in the left upper lung. The visualized skeletal structures are unremarkable. IMPRESSION: No active cardiopulmonary disease. Electronically Signed   By: Jasmine Pang M.D.   On: 08/03/2021 21:48         Patient reports she is sexually abstinent and not pregnant  PROCEDURES:  Critical Care performed: No  Procedures   MEDICATIONS ORDERED IN ED: Medications - No data to display   IMPRESSION / MDM / ASSESSMENT AND PLAN / ED COURSE  I reviewed the triage vital signs and the nursing notes.                              Differential diagnosis includes, but is not limited to, possible hyperventilation, panic attack, pneumothorax, pneumonia, ACS though symptomatology ECG initial troponin strongly suggest no evidence of ACS, low but nonzero risk for pulmonary embolism especially given her recent airline flight though she reports she also had a feeling of panic with similar symptoms that lasted only briefly on the flight.  Labs reviewed no evidence of acute anemia, no acute infectious symptoms, chest x-ray repeat troponin D-dimer pending.  Patient's presentation is most consistent with acute illness / injury with system symptoms.  The patient is on the cardiac monitor to evaluate for evidence of arrhythmia and/or significant heart rate changes.    Labs are interpreted by me as normal CBC, normal initial troponin, mild hypokalemia on metabolic panel.  ----------------------------------------- 10:36 PM on 08/03/2021 ----------------------------------------- Patient resting comfortably.  Asymptomatic.  Low risk pretest probability for PE, D-dimer exclusionary.  At this juncture, no evidence of acute cardiovascular or pulmonary or acute neurologic process.  Suspect patient likely had some type of a panic attack and hyperventilation, and is now work-up very reassuring.  No associated chest pain.  Heart pathway low risk for ACS  Discussed with patient follow-up options, she will  follow-up with her primary care.  She reports she has similarly followed up with a cardiologist at [facility] when she has had similar episodes and they have not found a cardiac cause (Dr. Thana Ates) -reviewed notes, patient has had extensive cardiac work-up including previous cardiac MRI and previous echocardiogram  Patient currently asymptomatic.  Return precautions and treatment recommendations and follow-up discussed with the patient who is agreeable with the plan.   FINAL CLINICAL IMPRESSION(S) / ED DIAGNOSES   Final diagnoses:  Near syncope  Panic attack     Rx / DC Orders   ED  Discharge Orders     None        Note:  This document was prepared using Dragon voice recognition software and may include unintentional dictation errors.   Sharyn CreamerQuale, Konni Kesinger, MD 08/03/21 2245

## 2021-08-03 NOTE — ED Triage Notes (Signed)
Patient to ER via [facility]. Reports suddenly feeling like she was suffocating this afternoon, then feeling so dizzy that she was going to pass out. Reports losing feeling in bilateral arms. States that she tried to drive to [facility] but kept feeling as though she was going to lose consciousness so pulled over and called EMS. 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   Reports that she has a history of anxiety and is usually able to get ahead of her panic attacks but believes this time she was unable to.

## 2022-01-20 ENCOUNTER — Other Ambulatory Visit: Payer: Self-pay

## 2022-01-20 ENCOUNTER — Emergency Department: Payer: Self-pay

## 2022-01-20 ENCOUNTER — Emergency Department
Admission: EM | Admit: 2022-01-20 | Discharge: 2022-01-20 | Disposition: A | Discharge status: Home / Self Care | Payer: Self-pay | Attending: Emergency Medicine | Admitting: Emergency Medicine

## 2022-01-20 DIAGNOSIS — R197 Diarrhea, unspecified: Secondary | ICD-10-CM

## 2022-01-20 DIAGNOSIS — R519 Headache, unspecified: Secondary | ICD-10-CM

## 2022-01-20 DIAGNOSIS — R0789 Other chest pain: Secondary | ICD-10-CM

## 2022-01-20 DIAGNOSIS — R42 Dizziness and giddiness: Secondary | ICD-10-CM

## 2022-01-20 DIAGNOSIS — Z20822 Contact with and (suspected) exposure to covid-19: Secondary | ICD-10-CM | POA: Insufficient documentation

## 2022-01-20 LAB — RESP PANEL BY RT-PCR (FLU A&B, COVID) ARPGX2
Influenza A by PCR: NEGATIVE
Influenza B by PCR: NEGATIVE
SARS Coronavirus 2 by RT PCR: NEGATIVE

## 2022-01-20 LAB — URINALYSIS, ROUTINE W REFLEX MICROSCOPIC
Bilirubin Urine: NEGATIVE
Glucose, UA: NEGATIVE mg/dL
Hgb urine dipstick: NEGATIVE
Ketones, ur: 5 mg/dL — AB
Leukocytes,Ua: NEGATIVE
Nitrite: NEGATIVE
Protein, ur: NEGATIVE mg/dL
Specific Gravity, Urine: 1.025 (ref 1.005–1.030)
pH: 5 (ref 5.0–8.0)

## 2022-01-20 LAB — BASIC METABOLIC PANEL
Anion gap: 7 (ref 5–15)
BUN: 12 mg/dL (ref 6–20)
CO2: 27 mmol/L (ref 22–32)
Calcium: 9.4 mg/dL (ref 8.9–10.3)
Chloride: 110 mmol/L (ref 98–111)
Creatinine, Ser: 0.62 mg/dL (ref 0.44–1.00)
GFR, Estimated: >60 mL/min (ref >60)
Glucose, Bld: 99 mg/dL (ref 70–99)
Potassium: 4.1 mmol/L (ref 3.5–5.1)
Sodium: 144 mmol/L (ref 135–145)

## 2022-01-20 LAB — CBC
HCT: 40.4 % (ref 36.0–46.0)
Hemoglobin: 12.3 g/dL (ref 12.0–15.0)
MCH: 24.8 pg — ABNORMAL LOW (ref 26.0–34.0)
MCHC: 30.4 g/dL (ref 30.0–36.0)
MCV: 81.6 fL (ref 80.0–100.0)
Platelets: 337 10*3/uL (ref 150–400)
RBC: 4.95 MIL/uL (ref 3.87–5.11)
RDW: 13.5 % (ref 11.5–15.5)
WBC: 5 10*3/uL (ref 4.0–10.5)
nRBC: 0 % (ref 0.0–0.2)

## 2022-01-20 LAB — TROPONIN I (HIGH SENSITIVITY): Troponin I (High Sensitivity): 3 ng/L (ref <18)

## 2022-01-20 LAB — CK: Total CK: 87 U/L (ref 38–234)

## 2022-01-20 LAB — TSH: TSH: 2.969 u[IU]/mL (ref 0.350–4.500)

## 2022-01-20 MED ORDER — SODIUM CHLORIDE 0.9 % IV BOLUS: 1000.0000 mL | Freq: Once | INTRAVENOUS | Status: DC

## 2022-01-20 MED ORDER — DICYCLOMINE HCL 10 MG PO CAPS: 10.0000 mg | ORAL_CAPSULE | Freq: Three times a day (TID) | ORAL | 0 refills | Status: DC | PRN

## 2022-01-20 MED ORDER — LACTATED RINGERS IV BOLUS
1000.0000 mL | Freq: Once | INTRAVENOUS | Status: AC
  Administered 2022-01-20: 1000 mL via INTRAVENOUS

## 2022-01-20 MED ORDER — IBUPROFEN 600 MG PO TABS: 600.0000 mg | ORAL_TABLET | Freq: Four times a day (QID) | ORAL | 0 refills | Status: DC | PRN

## 2022-01-20 NOTE — ED Notes (Signed)
Called lab and confirmed they had enough blood to add on the CK and TSH tests

## 2022-01-20 NOTE — ED Triage Notes (Signed)
Pt here with cp and dizziness. Pt states pain is centered and radiates to her neck. Pt was at the ENT doc when the pain started but states it has been going on for a week. Pt states she had diarrhea, blurred vision, and abd pain last night. Pt states cp is constant.

## 2022-01-20 NOTE — ED Provider Notes (Signed)
[facility] Provider Note    Event Date/Time   First MD Initiated Contact with Patient 01/20/22 1218     (approximate)   History   Chest Pain and Dizziness   HPI  Dannae Kato is a 51 y.o. female with a history of anemia and anxiety who presents with multiple symptoms that have been occurring over the last week.  She endorses lightheadedness, intermittent blurred vision, and feeling like her limbs are heavy bilaterally.  She also had some chest pain earlier today that is resolved.  She was intermittent shortness of breath, lower abdominal discomfort and some diarrhea which is now resolved  I reviewed the past medical records.  The patient's most recent outpatient encounter was with internal medicine on 11/17 for tinnitus in both ears.  She previously had been treated for a presumed URI on 11/8.   Physical Exam   Triage Vital Signs: ED Triage Vitals [01/20/22 1039]  Enc Vitals Group     BP 134/71     Pulse Rate 79     Resp 18     Temp 98.3 F (36.8 C)     Temp Source Oral     SpO2 100 %     Weight 121 lb 4.1 oz (55 kg)     Height 5\' 4"  (1.626 m)     Head Circumference      Peak Flow      Pain Score 6     Pain Loc      Pain Edu?      Excl. in GC?     Most recent vital signs: Vitals:   01/20/22 1039 01/20/22 1522  BP: 134/71 128/70  Pulse: 79 78  Resp: 18 16  Temp: 98.3 F (36.8 C) 98 F (36.7 C)  SpO2: 100% 100%     General: Awake, no distress.  CV:  Good peripheral perfusion.  Normal heart sounds. Resp:  Normal effort.  Lungs CTAB. Abd:  No distention.  Other:  EOMI.  PERRLA.  No facial droop.  Cranial nerves III through XII intact.  Motor and sensory intact in all extremities.  No pronator drift.  No ataxia on finger-to-nose.  Slightly dry mucous membranes.   ED Results / Procedures / Treatments   Labs (all labs ordered are listed, but only abnormal results are displayed) Labs Reviewed  CBC - Abnormal; Notable for the  following components:      Result Value   MCH 24.8 (*)    All other components within normal limits  URINALYSIS, ROUTINE W REFLEX MICROSCOPIC - Abnormal; Notable for the following components:   Color, Urine YELLOW (*)    APPearance CLEAR (*)    Ketones, ur 5 (*)    All other components within normal limits  RESP PANEL BY RT-PCR (FLU A&B, COVID) ARPGX2  BASIC METABOLIC PANEL  CK  TSH  POC URINE PREG, ED  TROPONIN I (HIGH SENSITIVITY)     EKG  ED ECG REPORT I, 01/22/22, the attending physician, personally viewed and interpreted this ECG.  Date: 01/20/2022 EKG Time: 1036 Rate: 83 Rhythm: normal sinus rhythm QRS Axis: normal Intervals: normal ST/T Wave abnormalities: normal Narrative Interpretation: no evidence of acute ischemia   RADIOLOGY  Chest x-ray: I independently viewed and interpreted the images; there is no focal consolidation or edema.  Radiology report indicates emphysematous changes  CT head: No acute abnormality  PROCEDURES:  Critical Care performed: No  Procedures   MEDICATIONS ORDERED IN ED: Medications  lactated ringers bolus 1,000 mL (0 mLs Intravenous Stopped 01/20/22 1524)     IMPRESSION / MDM / ASSESSMENT AND PLAN / ED COURSE  I reviewed the triage vital signs and the nursing notes.  51 year old female with PMH as noted above presents with multiple complaints over approximately the last 5 days to 1 week including fatigue, bilateral limb weakness, intermittent blurred vision, shortness of breath, and some chest discomfort today.  Physical exam is unremarkable for acute findings.  The patient is overall well-appearing.  Differential diagnosis includes, but is not limited to, dehydration/bulimia, viral syndrome, less likely UTI or other infection, hypothyroidism, other endocrine was, electrolyte abnormality or other metabolic disturbance, less likely cardiac cause.  I have a low suspicion for acute CNS cause given the lack of focal  neurologic findings.  Due to the blurred vision we will obtain CT head to rule out subacute stroke, additional lab work-up, give a fluid bolus, and reassess.  Patient's presentation is most consistent with acute presentation with potential threat to life or bodily function.  The patient is on the cardiac monitor to evaluate for evidence of arrhythmia and/or significant heart rate changes.  ----------------------------------------- 3:37 PM on 01/20/2022 -----------------------------------------  CT head is negative.  Given the normal neurologic exam and lack of any focal deficits there is no indication for MRI or additional acute work-up.  Respiratory panel, CK, and TSH are all negative as is the urinalysis.  There is no indication for a repeat troponin given the duration of the symptoms and the very low suspicion for ACS.  On reassessment, the patient appears comfortable and her vital signs are stable.  She is appropriate for discharge home.  I counseled her on the results of the work-up.  I gave her strict return precautions and she expressed understanding.  She will follow-up with her primary care doctor.    FINAL CLINICAL IMPRESSION(S) / ED DIAGNOSES   Final diagnoses:  Lightheadedness  Atypical chest pain  Diarrhea, unspecified type  Nonintractable headache, unspecified chronicity pattern, unspecified headache type     Rx / DC Orders   ED Discharge Orders          Ordered    ibuprofen (ADVIL) 600 MG tablet  Every 6 hours PRN        01/20/22 1515    dicyclomine (BENTYL) 10 MG capsule  3 times daily PRN        01/20/22 1515             Note:  This document was prepared using Dragon voice recognition software and may include unintentional dictation errors.    Dionne Bucy, MD 01/20/22 1538

## 2022-01-20 NOTE — Discharge Instructions (Addendum)
May take the ibuprofen as needed for pain and the Bentyl if needed for abdominal cramping and diarrhea.  Make sure to drink plenty of fluids.  Follow-up with your regular doctor.  Return to the ER for new, worsening, or persistent severe headache, chest or neck pain, difficulty breathing, lightheadedness or weakness, fever, vomiting, or any other new or worsening symptoms that concern you.

## 2022-01-20 NOTE — ED Notes (Signed)
Pt states hx of lightheadedness, low BP, palpitations. Will have echo in a couple weeks.

## 2022-04-12 DIAGNOSIS — G43909 Migraine, unspecified, not intractable, without status migrainosus: Secondary | ICD-10-CM | POA: Insufficient documentation

## 2022-04-12 DIAGNOSIS — T783XXA Angioneurotic edema, initial encounter: Secondary | ICD-10-CM | POA: Insufficient documentation

## 2022-04-12 DIAGNOSIS — G629 Polyneuropathy, unspecified: Secondary | ICD-10-CM | POA: Insufficient documentation

## 2022-04-29 IMAGING — CR DG CHEST 2V
2 series · 2 of 2 positions shown · non-contrast
Comparison: None.

CLINICAL DATA: Intermittent chest pain with near syncope.

EXAM:
CHEST - 2 VIEW

[chest lat]
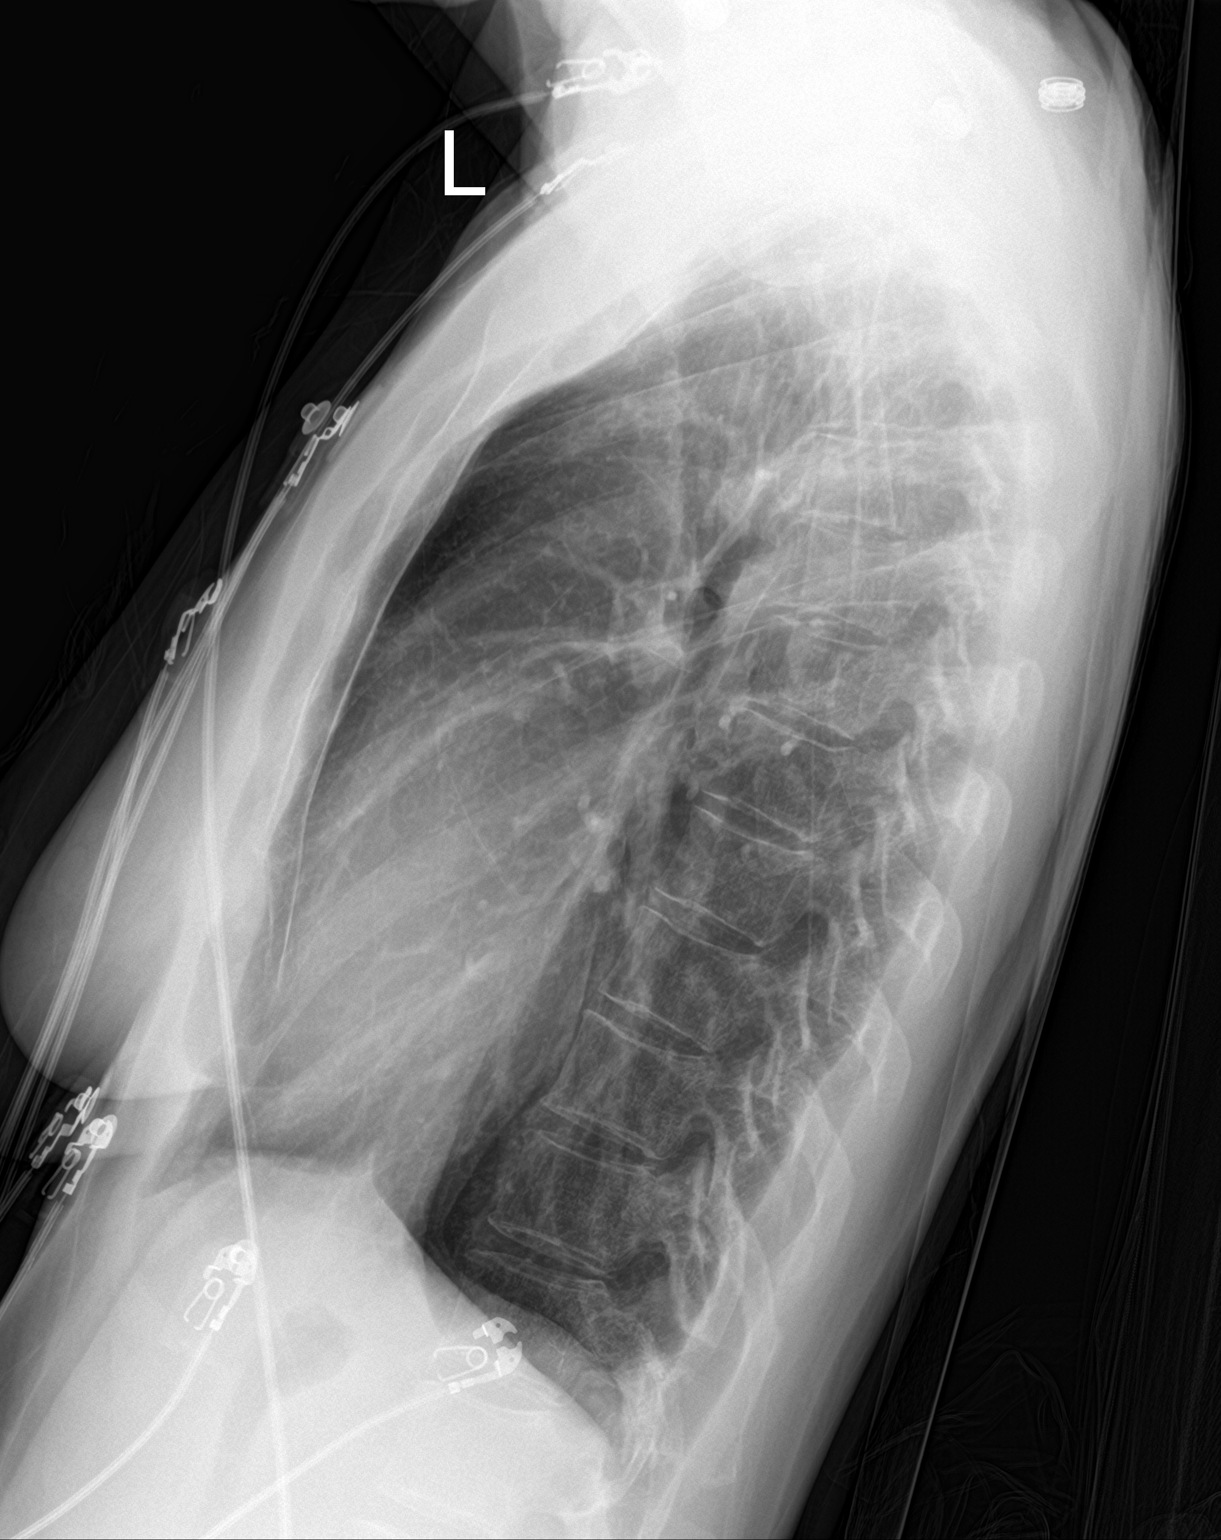

[chest ap]
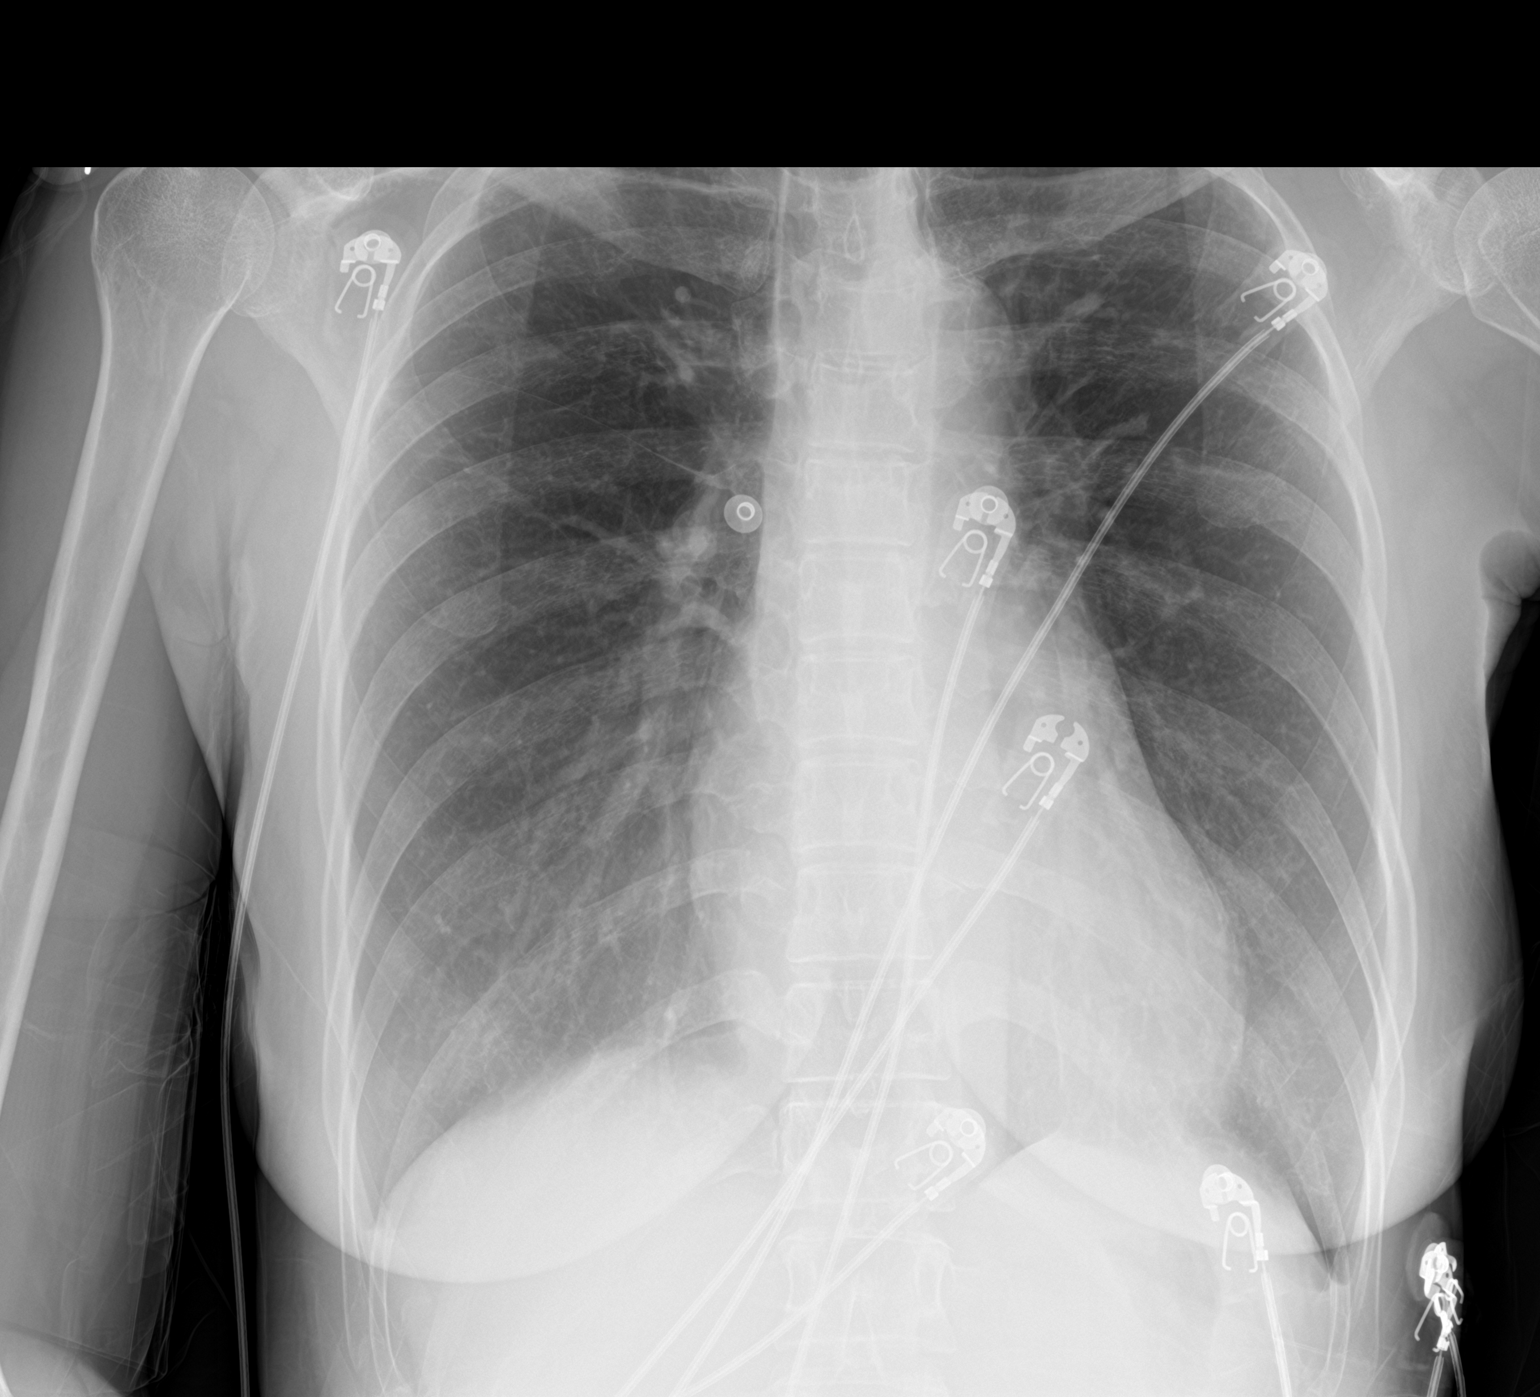

[2 of 2 positions shown; findings below may reference images not displayed]

FINDINGS: The heart size and mediastinal contours are within normal limits.
Both lungs are clear. The visualized skeletal structures are
unremarkable.
IMPRESSION: No active cardiopulmonary disease.

## 2022-06-11 ENCOUNTER — Other Ambulatory Visit: Payer: Self-pay | Admitting: Physician Assistant

## 2022-06-11 ENCOUNTER — Encounter: Payer: Self-pay | Admitting: Oncology

## 2022-06-11 DIAGNOSIS — H93A2 Pulsatile tinnitus, left ear: Secondary | ICD-10-CM

## 2022-06-15 ENCOUNTER — Ambulatory Visit: Admission: RE | Admit: 2022-06-15 | Payer: Medicaid Other | Source: Ambulatory Visit

## 2022-06-17 ENCOUNTER — Ambulatory Visit: Admission: RE | Admit: 2022-06-17 | Payer: Medicaid Other | Source: Ambulatory Visit

## 2022-07-01 ENCOUNTER — Ambulatory Visit: Admission: RE | Admit: 2022-07-01 | Payer: Medicaid Other | Source: Ambulatory Visit

## 2022-07-03 ENCOUNTER — Ambulatory Visit: Admission: RE | Admit: 2022-07-03 | Payer: Medicaid Other | Source: Ambulatory Visit

## 2022-07-04 ENCOUNTER — Encounter: Payer: Self-pay | Admitting: Oncology

## 2022-07-04 ENCOUNTER — Ambulatory Visit
Admission: EM | Admit: 2022-07-04 | Discharge: 2022-07-04 | Disposition: A | Discharge status: Home / Self Care | Payer: Medicaid Other | Attending: Emergency Medicine | Admitting: Emergency Medicine

## 2022-07-04 ENCOUNTER — Encounter: Payer: Self-pay | Admitting: Physician Assistant

## 2022-07-04 DIAGNOSIS — J069 Acute upper respiratory infection, unspecified: Secondary | ICD-10-CM

## 2022-07-04 DIAGNOSIS — R051 Acute cough: Secondary | ICD-10-CM

## 2022-07-04 DIAGNOSIS — J029 Acute pharyngitis, unspecified: Secondary | ICD-10-CM | POA: Diagnosis present

## 2022-07-04 DIAGNOSIS — R0981 Nasal congestion: Secondary | ICD-10-CM

## 2022-07-04 LAB — GROUP A STREP BY PCR: Group A Strep by PCR: NOT DETECTED

## 2022-07-04 MED ORDER — IPRATROPIUM BROMIDE 0.06 % NA SOLN: 2.0000 | Freq: Four times a day (QID) | NASAL | 0 refills | Status: DC

## 2022-07-04 MED ORDER — PROMETHAZINE-DM 6.25-15 MG/5ML PO SYRP: 5.0000 mL | ORAL_SOLUTION | Freq: Four times a day (QID) | ORAL | 0 refills | Status: DC | PRN

## 2022-07-04 MED ORDER — LIDOCAINE VISCOUS HCL 2 % MT SOLN: 15.0000 mL | OROMUCOSAL | 0 refills | Status: DC | PRN

## 2022-07-04 NOTE — ED Triage Notes (Signed)
Pt c/o sore throat, headache, and cough x5days  Pt went to [facility] in [facility] and was told that she has fluid behind her ears and wants to have her ears checked.

## 2022-07-04 NOTE — ED Provider Notes (Signed)
[facility]    CSN: [id number] Arrival date & time: 07/04/22  1549      History   Chief Complaint Chief Complaint  Patient presents with   Sore Throat    HPI Uri Naragon is a 52 y.o. female presenting for 5-day history of cough, congestion, fatigue, headaches, voice hoarseness.  She was seen at onset of symptoms at a different urgent care and had negative flu and COVID testing.  She reports that she initially had a fever and bodyaches at the onset but those symptoms have resolved.  She says that she has had persistent sore throat which seemed a little worse yesterday.  She also would like to have her ears assessed because she was told that she had fluid behind her ears and she is worried about a potential infection.  She has taken Mucinex once.  She is concerned about persistent symptoms.  No other complaints.  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   Iron deficiency anemia 05/02/2020   Seasonal allergies 06/07/2019   Dysmenorrhea 02/21/2019   MEE (middle ear effusion), bilateral 02/21/2019   Cervical stenosis of spine 06/08/2018   Hand joint pain 06/08/2018   Positive antinuclear antibody 06/08/2018   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   Chronic abdominal pain 10/10/2014   Headache disorder 12/12/2013   Patellofemoral dysfunction 11/15/2013   Stress incontinence 08/10/2013   Lumbosacral pain 08/09/2013    Bursitis of both hips 08/09/2013   Other bursitis of hip, unspecified hip 08/09/2013   H/O vitamin D deficiency 06/23/2013   Personal history of other endocrine, nutritional and metabolic disease 78/29/5621   Iliotibial band syndrome 05/31/2013   Irritable bowel syndrome with constipation 04/27/2013   Gastroesophageal reflux disease 04/04/2013   Eustachian tube dysfunction 11/29/2012   Lesion of sciatic nerve 09/27/2012   Piriformis syndrome 09/27/2012   Generalized anxiety disorder 03/28/2012    Past Surgical History:  Procedure Laterality Date   BREAST EXCISIONAL BIOPSY Left 1992   CYSTOSCOPY  2003   LIPOMA EXCISION Left 2007   shoulder    OB History   No obstetric history on file.      Home Medications    Prior to Admission medications   Medication Sig Start Date End Date Taking? Authorizing Provider  dicyclomine (BENTYL) 10 MG capsule Take 1 capsule (10 mg total) by mouth 3 (three) times daily as needed (abdominal pain, cramping). 01/20/22  Yes Dionne Bucy, MD  ibuprofen (ADVIL) 600 MG tablet Take 1 tablet (600 mg total) by mouth every 6 (six) hours as needed. 01/20/22  Yes Dionne Bucy, MD  LORazepam (ATIVAN) 1 MG tablet Take 0.5 mg by mouth every 8 (eight) hours as needed for anxiety.   Yes [provider]  magnesium gluconate (MAGONATE) 500 MG tablet Take by mouth.   Yes [provider]    Family History Family History  Problem Relation Age of Onset   Stroke Mother    COPD  Maternal Aunt    Hypertension Maternal Uncle    Asthma Maternal Uncle    COPD Maternal Uncle    Diabetes Maternal Grandmother    Asthma Maternal Grandmother    Cancer Maternal Grandfather    Colon cancer Maternal Grandfather     Social History Social History   Tobacco Use   Smoking status: Never   Smokeless tobacco: Never  Vaping Use   Vaping Use: Never used  Substance Use Topics   Alcohol use: Not Currently   Drug use: Never     Allergies    Contrast media [iodinated contrast media], Hydrocodone, and Propoxyphene   Review of Systems Review of Systems  Constitutional:  Positive for fatigue. Negative for chills, diaphoresis and fever.  HENT:  Positive for congestion, rhinorrhea, sore throat and voice change. Negative for ear pain, sinus pressure and sinus pain.   Respiratory:  Positive for cough. Negative for shortness of breath.   Gastrointestinal:  Negative for abdominal pain, nausea and vomiting.  Musculoskeletal:  Negative for arthralgias and myalgias.  Skin:  Negative for rash.  Neurological:  Negative for weakness and headaches.  Hematological:  Negative for adenopathy.     Physical Exam Triage Vital Signs ED Triage Vitals  Enc Vitals Group     BP 07/04/22 1601 (!) 144/84     Pulse Rate 07/04/22 1601 75     Resp --      Temp 07/04/22 1601 98.6 F (37 C)     Temp Source 07/04/22 1601 Oral     SpO2 07/04/22 1601 98 %     Weight 07/04/22 1600 133 lb (60.3 kg)     Height 07/04/22 1600 5\' 4"  (1.626 m)     Head Circumference --      Peak Flow --      Pain Score 07/04/22 1559 7     Pain Loc --      Pain Edu? --      Excl. in GC? --    No data found.  Updated Vital Signs BP (!) 144/84 (BP Location: Left Arm)   Pulse 75   Temp 98.6 F (37 C) (Oral)   Ht 5\' 4"  (1.626 m)   Wt 133 lb (60.3 kg)   LMP 07/02/2022   SpO2 98%   BMI 22.83 kg/m   Physical Exam Vitals and nursing note reviewed.  Constitutional:      General: She is not in acute distress.    Appearance: Normal appearance. She is well-developed. She is ill-appearing. She is not toxic-appearing.     Comments: Mildly ill-appearing.  Appears fatigued.  Mild voice hoarseness.  HENT:     Head: Normocephalic and atraumatic.     Nose: Congestion present.     Mouth/Throat:     Mouth: Mucous membranes are moist.     Pharynx: Oropharynx is clear. Posterior oropharyngeal erythema present.  Eyes:     General: No scleral icterus.       Right eye: No  discharge.        Left eye: No discharge.     Conjunctiva/sclera: Conjunctivae normal.  Cardiovascular:     Rate and Rhythm: Normal rate and regular rhythm.     Heart sounds: Normal heart sounds.  Pulmonary:     Effort: Pulmonary effort is normal. No respiratory distress.     Breath sounds: Normal breath sounds.  Musculoskeletal:     Cervical back: Neck supple.  Skin:    General: Skin is dry.  Neurological:  General: No focal deficit present.     Mental Status: She is alert. Mental status is at baseline.     Motor: No weakness.     Gait: Gait normal.  Psychiatric:        Mood and Affect: Mood normal.        Behavior: Behavior normal.        Thought Content: Thought content normal.      UC Treatments / Results  Labs (all labs ordered are listed, but only abnormal results are displayed) Labs Reviewed  GROUP A STREP BY PCR    EKG   Radiology No results found.  Procedures Procedures (including critical care time)  Medications Ordered in UC Medications - No data to display  Initial Impression / Assessment and Plan / UC Course  I have reviewed the triage vital signs and the nursing notes.  Pertinent labs & imaging results that were available during my care of the patient were reviewed by me and considered in my medical decision making (see chart for details).     *** Final Clinical Impressions(s) / UC Diagnoses   Final diagnoses:  None   Discharge Instructions   None    ED Prescriptions   None    PDMP not reviewed this encounter.

## 2022-07-04 NOTE — Discharge Instructions (Addendum)
URI/COLD SYMPTOMS: Your exam today is consistent with a viral illness. Antibiotics are not indicated at this time. Use medications as directed, including cough syrup, nasal saline, and decongestants. Your symptoms should improve over the next few days and resolve within 7-10 days. Increase rest and fluids. F/u if symptoms worsen or predominate such as sore throat, ear pain, productive cough, shortness of breath, or if you develop high fevers or worsening fatigue over the next several days.    I will send antibiotics if strep is positive.

## 2022-07-06 ENCOUNTER — Ambulatory Visit: Payer: Self-pay | Admitting: Family Medicine

## 2022-07-06 ENCOUNTER — Encounter: Payer: Self-pay | Admitting: Oncology

## 2022-07-09 ENCOUNTER — Ambulatory Visit: Payer: Medicaid Other | Admitting: Family Medicine

## 2022-07-09 ENCOUNTER — Ambulatory Visit
Admission: RE | Admit: 2022-07-09 | Discharge: 2022-07-09 | Disposition: A | Discharge status: Home / Self Care | Payer: Medicaid Other | Attending: Family Medicine | Admitting: Family Medicine

## 2022-07-09 ENCOUNTER — Ambulatory Visit
Admission: RE | Admit: 2022-07-09 | Discharge: 2022-07-09 | Disposition: A | Discharge status: Home / Self Care | Payer: Medicaid Other | Source: Ambulatory Visit | Attending: Family Medicine | Admitting: Family Medicine

## 2022-07-09 ENCOUNTER — Encounter: Payer: Self-pay | Admitting: Family Medicine

## 2022-07-09 VITALS — BP 120/70 | HR 78 | Ht 64.0 in | Wt 132.0 lb

## 2022-07-09 DIAGNOSIS — M170 Bilateral primary osteoarthritis of knee: Secondary | ICD-10-CM

## 2022-07-09 MED ORDER — OMEPRAZOLE 40 MG PO CPDR: 40.0000 mg | DELAYED_RELEASE_CAPSULE | Freq: Every day | ORAL | 3 refills | Status: DC | PRN

## 2022-07-09 MED ORDER — MELOXICAM 15 MG PO TABS
ORAL_TABLET | ORAL | 0 refills | Status: AC
Start: 2022-07-09 — End: 2022-08-06

## 2022-07-13 NOTE — Assessment & Plan Note (Signed)
Acute on chronic flare with localization to the patellofemoral articulations bilaterally.  Ongoing symptomatology.  Plan as follows: - Obtain updated bilateral knee x-rays - Initiate scheduled meloxicam x 2 weeks then daily as needed - GI prophylaxis regimen while on meloxicam due to comorbid GERD history - Close follow-up to review x-rays and advance treatment as indicated

## 2022-07-13 NOTE — Progress Notes (Signed)
     Primary Care / Sports Medicine Office Visit  Patient Information:  Patient ID: Robin Schwartz, female DOB: September 22, 1970 Age: 52 y.o. MRN: [id number]   Robin Schwartz is a pleasant 52 y.o. female presenting with the following:  Chief Complaint  Patient presents with   Knee Pain    Wobbly,weak,tingling, felt like it was going to give out, both    Vitals:   07/09/22 0819  BP: 120/70  Pulse: 78  SpO2: 98%   Vitals:   07/09/22 0819  Weight: 132 lb (59.9 kg)  Height: 5\' 4"  (1.626 m)   Body mass index is 22.66 kg/m.     Independent interpretation of notes and tests performed by another provider:   None  Procedures performed:   None  Pertinent History, Exam, Impression, and Recommendations:   [name] was seen today for knee pain.  Bilateral primary osteoarthritis of knee Assessment & Plan: Acute on chronic flare with localization to the patellofemoral articulations bilaterally.  Ongoing symptomatology.  Plan as follows: - Obtain updated bilateral knee x-rays - Initiate scheduled meloxicam x 2 weeks then daily as needed - GI prophylaxis regimen while on meloxicam due to comorbid GERD history - Close follow-up to review x-rays and advance treatment as indicated  Orders: -     DG Knee Complete 4 Views Right; Future -     DG Knee Complete 4 Views Left; Future -     Meloxicam; Take 1 tablet (15 mg total) by mouth daily for 14 days, THEN 1 tablet (15 mg total) daily as needed for up to 14 days for pain.  Dispense: 30 tablet; Refill: 0  Other orders -     Omeprazole; Take 1 capsule (40 mg total) by mouth daily as needed.  Dispense: 30 capsule; Refill: 3     Orders & Medications Meds ordered this encounter  Medications   omeprazole (PRILOSEC) 40 MG capsule    Sig: Take 1 capsule (40 mg total) by mouth daily as needed.    Dispense:  30 capsule    Refill:  3   meloxicam (MOBIC) 15 MG tablet    Sig: Take 1 tablet (15 mg total) by mouth daily for 14 days, THEN  1 tablet (15 mg total) daily as needed for up to 14 days for pain.    Dispense:  30 tablet    Refill:  0   Orders Placed This Encounter  Procedures   DG Knee Complete 4 Views Right   DG Knee Complete 4 Views Left     Return in about 4 weeks (around 08/06/2022).     Jerrol Banana, MD, [facility]   Primary Care Sports Medicine Primary Care and Sports Medicine at [facility]

## 2022-07-14 ENCOUNTER — Other Ambulatory Visit: Payer: Self-pay | Admitting: Physician Assistant

## 2022-07-14 ENCOUNTER — Ambulatory Visit
Admission: RE | Admit: 2022-07-14 | Discharge: 2022-07-14 | Disposition: A | Discharge status: Home / Self Care | Payer: Medicaid Other | Source: Ambulatory Visit | Attending: Physician Assistant | Admitting: Physician Assistant

## 2022-07-14 DIAGNOSIS — H93A2 Pulsatile tinnitus, left ear: Secondary | ICD-10-CM | POA: Insufficient documentation

## 2022-07-14 MED ORDER — GADOBUTROL 1 MMOL/ML IV SOLN: 6.0000 mL | Freq: Once | INTRAVENOUS | Status: DC | PRN

## 2022-09-01 IMAGING — CR DG ABDOMEN 1V
2 series · 2 of 2 positions shown · non-contrast
Comparison: None.

CLINICAL DATA: Abdominal pain and constipation

EXAM:
ABDOMEN - 1 VIEW

[abdomen kub (1 of 2)]
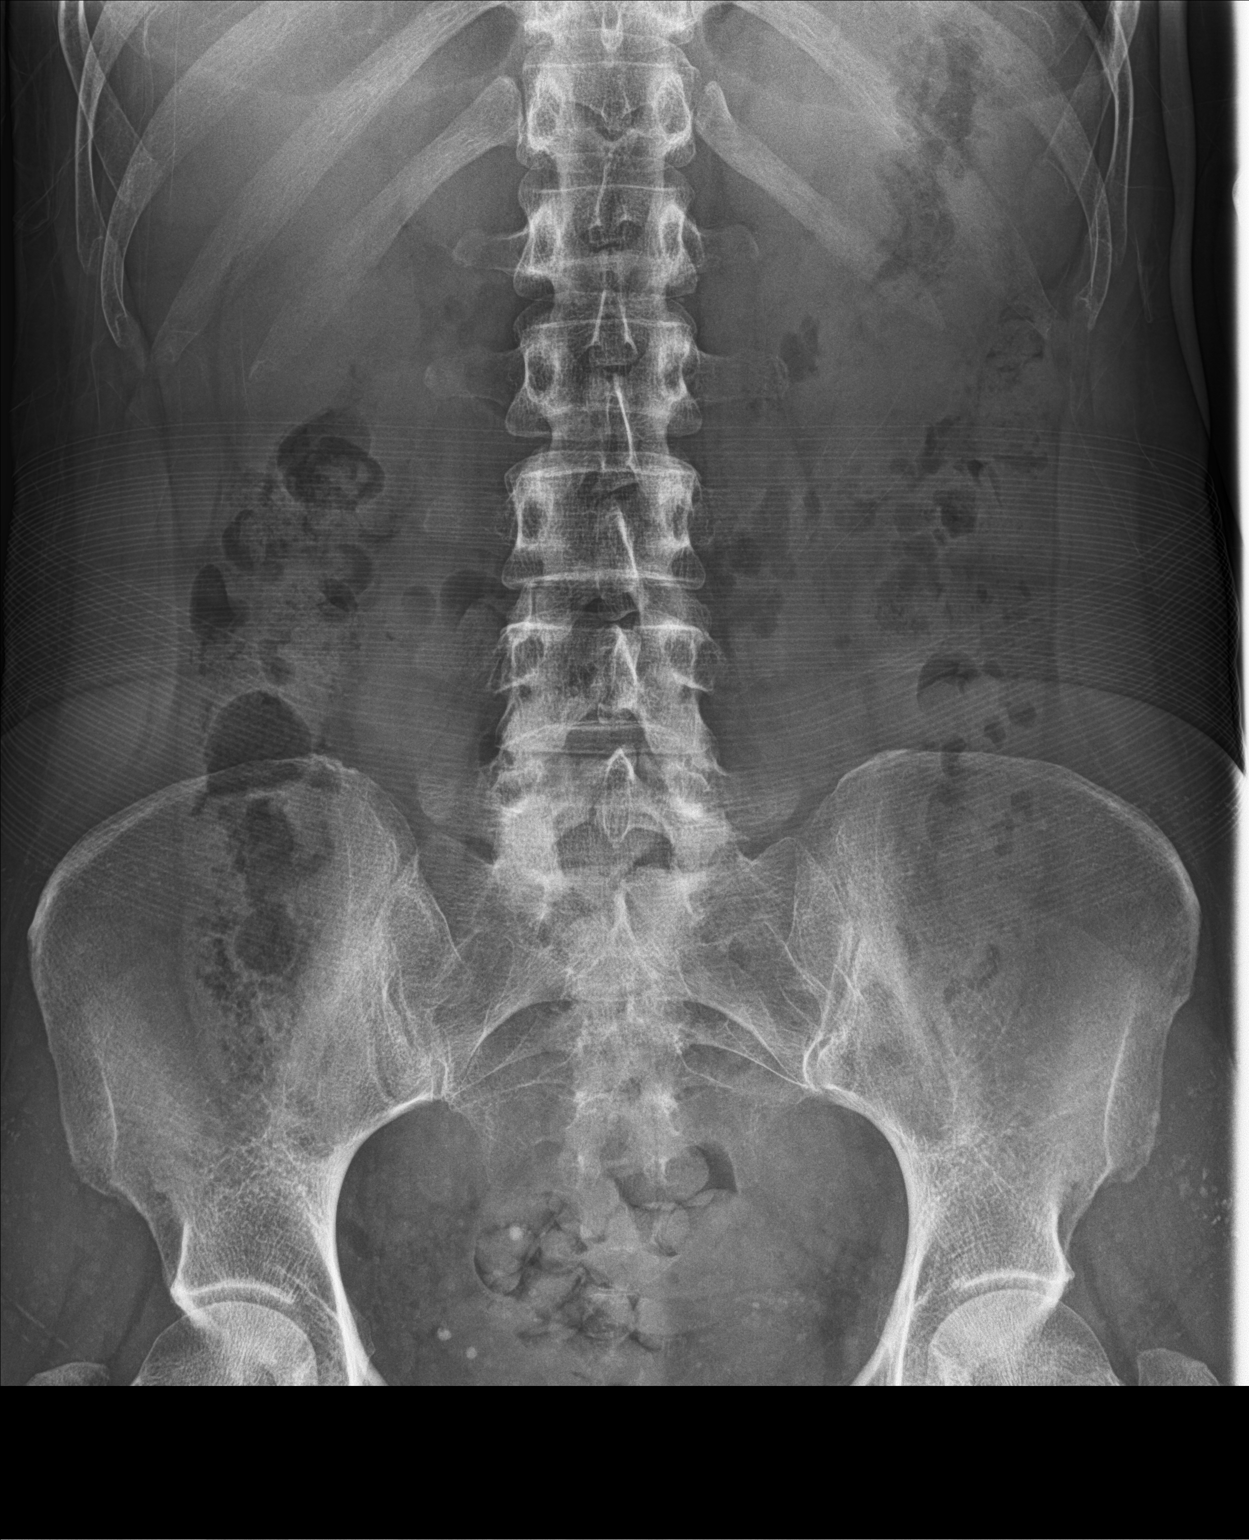

[abdomen kub (2 of 2)]
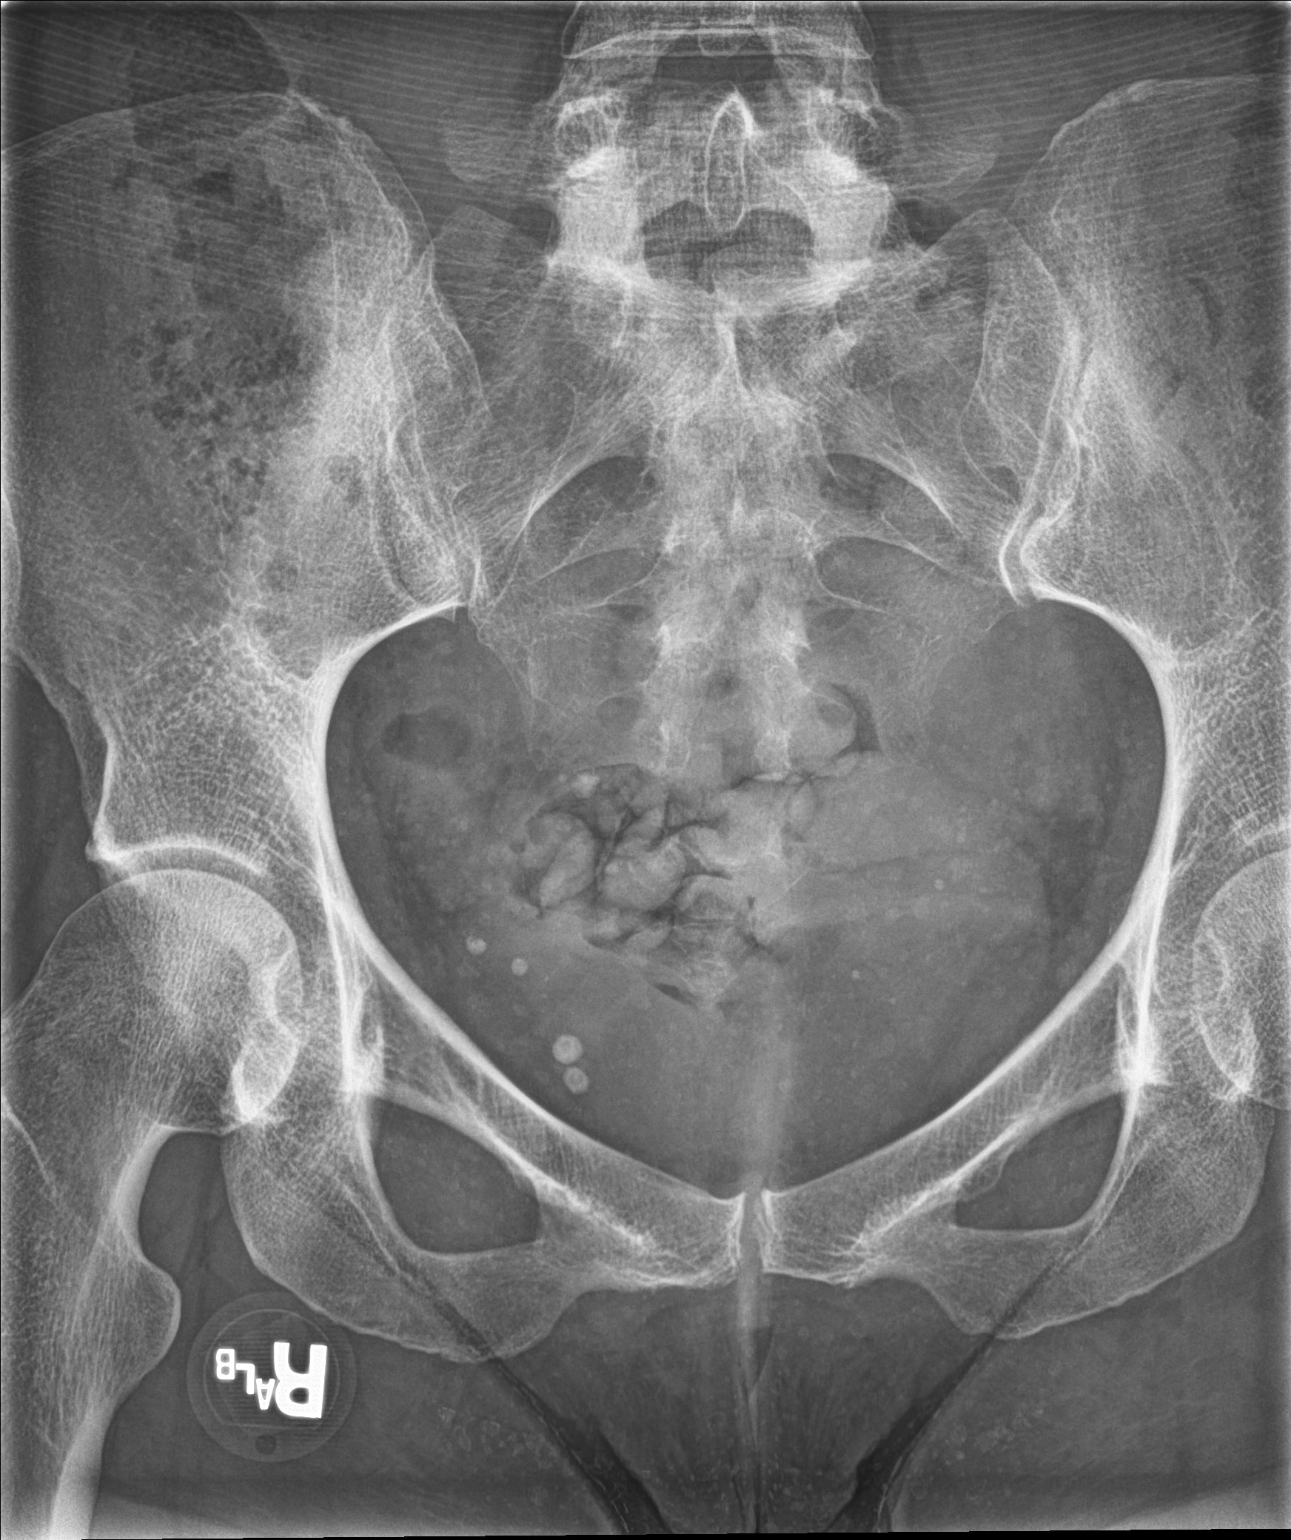

[2 of 2 positions shown; findings below may reference images not displayed]

FINDINGS: There is moderate stool throughout the colon. There is no bowel
dilatation or air-fluid level to suggest bowel obstruction. No free
air. Multiple pelvic calcifications likely represent phleboliths.
IMPRESSION: Moderate stool in colon.  No bowel obstruction or free air evident.

## 2022-09-10 ENCOUNTER — Ambulatory Visit (INDEPENDENT_AMBULATORY_CARE_PROVIDER_SITE_OTHER): Payer: Medicaid Other | Admitting: Family Medicine

## 2022-09-10 ENCOUNTER — Encounter: Payer: Self-pay | Admitting: Family Medicine

## 2022-09-10 VITALS — BP 118/60 | HR 70 | Ht 64.0 in | Wt 136.0 lb

## 2022-09-10 DIAGNOSIS — M25552 Pain in left hip: Secondary | ICD-10-CM | POA: Diagnosis not present

## 2022-09-10 DIAGNOSIS — M7542 Impingement syndrome of left shoulder: Secondary | ICD-10-CM | POA: Insufficient documentation

## 2022-09-10 MED ORDER — CELECOXIB 50 MG PO CAPS: 50.0000 mg | ORAL_CAPSULE | Freq: Two times a day (BID) | ORAL | 0 refills | Status: DC

## 2022-09-10 MED ORDER — BACLOFEN 5 MG PO TABS: 5.0000 mg | ORAL_TABLET | Freq: Every evening | ORAL | 0 refills | Status: DC | PRN

## 2022-09-10 NOTE — Assessment & Plan Note (Signed)
Noted x 3 weeks, atraumatic in onset, uncertain of activity change at that time.  Aggravated by overhead and ADLs.  Examination shows focality to the supraspinatus, secondarily to the biceps tendon, positive impingement noted, no weakness noted.  Patient has clinical history and findings consistent with rotator cuff related impingement with focality to the supraspinatus, secondary biceps tendinitis noted.  Plan as follows: - Dose Celebrex twice daily with food x 1-2 weeks then as needed - Can start pantoprazole (stomach acid reducer) if any GI symptoms noted - For persistent/worsening GI symptoms, stop Celebrex - Start home exercises with information provided - Contact our office at 4 weeks or beyond for any persistent symptoms to discuss next steps

## 2022-09-10 NOTE — Assessment & Plan Note (Signed)
Acute on chronic over the past several weeks in the setting of comorbid lumbosacral degenerative changes and chronic pain.  Pain primarily with laying on lateral hip, no radiation distally.  Examination shows primary focality to the greater trochanteric region, secondarily to the piriformis and ipsilateral sacroiliac joint.  She has a primary greater trochanteric pain syndrome, secondary piriformis syndrome, associated SI joint arthralgia.  We reviewed that these are most likely secondary/associated with comorbid lumbosacral chronic symptomatology.  Plan as follows: - Celebrex twice daily then as needed - As needed nightly muscle relaxer baclofen - Home-based rehab - Contact for any recalcitrant symptoms at which point we can consider ultrasound-guided corticosteroid injections

## 2022-09-10 NOTE — Patient Instructions (Addendum)
-   Can use baclofen (muscle relaxer) nightly on as-needed basis, side effect and be drowsiness - Dose Celebrex twice daily with food x 1-2 weeks then as needed - Can start pantoprazole (stomach acid reducer) if any GI symptoms noted - For persistent/worsening GI symptoms, stop Celebrex - Start home exercises with information provided - Contact our office at 4 weeks or beyond for any persistent symptoms to discuss next steps

## 2022-09-10 NOTE — Progress Notes (Signed)
Primary Care / Sports Medicine Office Visit  Patient Information:  Patient ID: Robin Schwartz, female DOB: 08-14-1970 Age: 52 y.o. MRN: [id number]   Robin Schwartz is a pleasant 52 y.o. female presenting with the following:  Chief Complaint  Patient presents with   Shoulder Pain    Left for a couple weeks, no known injury has tried motrin with little relief   Hip Pain    Left, no known injury    Vitals:   09/10/22 1416  BP: 118/60  Pulse: 70  SpO2: 100%   Vitals:   09/10/22 1416  Weight: 136 lb (61.7 kg)  Height: 5\' 4"  (1.626 m)   Body mass index is 23.34 kg/m.  No results found.   Independent interpretation of notes and tests performed by another provider:   None  Procedures performed:   None  Pertinent History, Exam, Impression, and Recommendations:   Robin Schwartz was seen today for shoulder pain and hip pain.  Rotator cuff impingement syndrome of left shoulder Assessment & Plan: Noted x 3 weeks, atraumatic in onset, uncertain of activity change at that time.  Aggravated by overhead and ADLs.  Examination shows focality to the supraspinatus, secondarily to the biceps tendon, positive impingement noted, no weakness noted.  Patient has clinical history and findings consistent with rotator cuff related impingement with focality to the supraspinatus, secondary biceps tendinitis noted.  Plan as follows: - Dose Celebrex twice daily with food x 1-2 weeks then as needed - Can start pantoprazole (stomach acid reducer) if any GI symptoms noted - For persistent/worsening GI symptoms, stop Celebrex - Start home exercises with information provided - Contact our office at 4 weeks or beyond for any persistent symptoms to discuss next steps   Greater trochanteric pain syndrome of left lower extremity Assessment & Plan: Acute on chronic over the past several weeks in the setting of comorbid lumbosacral degenerative changes and chronic pain.  Pain primarily with laying  on lateral hip, no radiation distally.  Examination shows primary focality to the greater trochanteric region, secondarily to the piriformis and ipsilateral sacroiliac joint.  She has a primary greater trochanteric pain syndrome, secondary piriformis syndrome, associated SI joint arthralgia.  We reviewed that these are most likely secondary/associated with comorbid lumbosacral chronic symptomatology.  Plan as follows: - Celebrex twice daily then as needed - As needed nightly muscle relaxer baclofen - Home-based rehab - Contact for any recalcitrant symptoms at which point we can consider ultrasound-guided corticosteroid injections   Other orders -     Celecoxib; Take 1 capsule (50 mg total) by mouth 2 (two) times daily.  Dispense: 60 capsule; Refill: 0 -     Baclofen; Take 1 tablet (5 mg total) by mouth at bedtime as needed (Muscle tightness).  Dispense: 30 tablet; Refill: 0     Orders & Medications Meds ordered this encounter  Medications   celecoxib (CELEBREX) 50 MG capsule    Sig: Take 1 capsule (50 mg total) by mouth 2 (two) times daily.    Dispense:  60 capsule    Refill:  0   Baclofen 5 MG TABS    Sig: Take 1 tablet (5 mg total) by mouth at bedtime as needed (Muscle tightness).    Dispense:  30 tablet    Refill:  0   No orders of the defined types were placed in this encounter.    No follow-ups on file.     Jerrol Banana, MD, [facility]   Primary Care Sports  Medicine Primary Care and Sports Medicine at [facility]

## 2022-09-27 ENCOUNTER — Other Ambulatory Visit: Payer: Self-pay

## 2022-09-27 ENCOUNTER — Emergency Department: Payer: Medicaid Other

## 2022-09-27 ENCOUNTER — Encounter: Payer: Self-pay | Admitting: *Deleted

## 2022-09-27 DIAGNOSIS — R519 Headache, unspecified: Secondary | ICD-10-CM | POA: Diagnosis not present

## 2022-09-27 DIAGNOSIS — Z5321 Procedure and treatment not carried out due to patient leaving prior to being seen by health care provider: Secondary | ICD-10-CM | POA: Diagnosis not present

## 2022-09-27 DIAGNOSIS — R079 Chest pain, unspecified: Secondary | ICD-10-CM | POA: Insufficient documentation

## 2022-09-27 LAB — CBC
HCT: 34.5 % — ABNORMAL LOW (ref 36.0–46.0)
Hemoglobin: 10.5 g/dL — ABNORMAL LOW (ref 12.0–15.0)
MCH: 24.6 pg — ABNORMAL LOW (ref 26.0–34.0)
MCHC: 30.4 g/dL (ref 30.0–36.0)
MCV: 80.8 fL (ref 80.0–100.0)
Platelets: 348 10*3/uL (ref 150–400)
RBC: 4.27 MIL/uL (ref 3.87–5.11)
RDW: 16.3 % — ABNORMAL HIGH (ref 11.5–15.5)
WBC: 7.4 10*3/uL (ref 4.0–10.5)
nRBC: 0 % (ref 0.0–0.2)

## 2022-09-27 LAB — BASIC METABOLIC PANEL
Anion gap: 11 (ref 5–15)
BUN: 11 mg/dL (ref 6–20)
CO2: 22 mmol/L (ref 22–32)
Calcium: 9 mg/dL (ref 8.9–10.3)
Chloride: 103 mmol/L (ref 98–111)
Creatinine, Ser: 0.62 mg/dL (ref 0.44–1.00)
GFR, Estimated: >60 mL/min (ref >60)
Glucose, Bld: 100 mg/dL — ABNORMAL HIGH (ref 70–99)
Potassium: 3.5 mmol/L (ref 3.5–5.1)
Sodium: 136 mmol/L (ref 135–145)

## 2022-09-27 LAB — TROPONIN I (HIGH SENSITIVITY): Troponin I (High Sensitivity): 3 ng/L (ref <18)

## 2022-09-27 NOTE — ED Triage Notes (Signed)
Pt reports mid upper "crampy" chest pain that lasted about 40 minutes around 1500 today while at driving today. Pain returned about 30 minutes ago. Onset of headache about 30 minutes ago.

## 2022-09-28 ENCOUNTER — Emergency Department
Admission: EM | Admit: 2022-09-28 | Discharge: 2022-09-28 | Discharge status: Against Medical Advice | Payer: Medicaid Other | Attending: Emergency Medicine | Admitting: Emergency Medicine

## 2022-09-28 HISTORY — DX: Migraine, unspecified, not intractable, without status migrainosus: G43.909

## 2022-10-27 IMAGING — DX DG CHEST 1V PORT
1 series · 1 of 1 positions shown · non-contrast
Comparison: 02/15/2020

CLINICAL DATA: Chest pain

EXAM:
PORTABLE CHEST 1 VIEW

[chest ap]
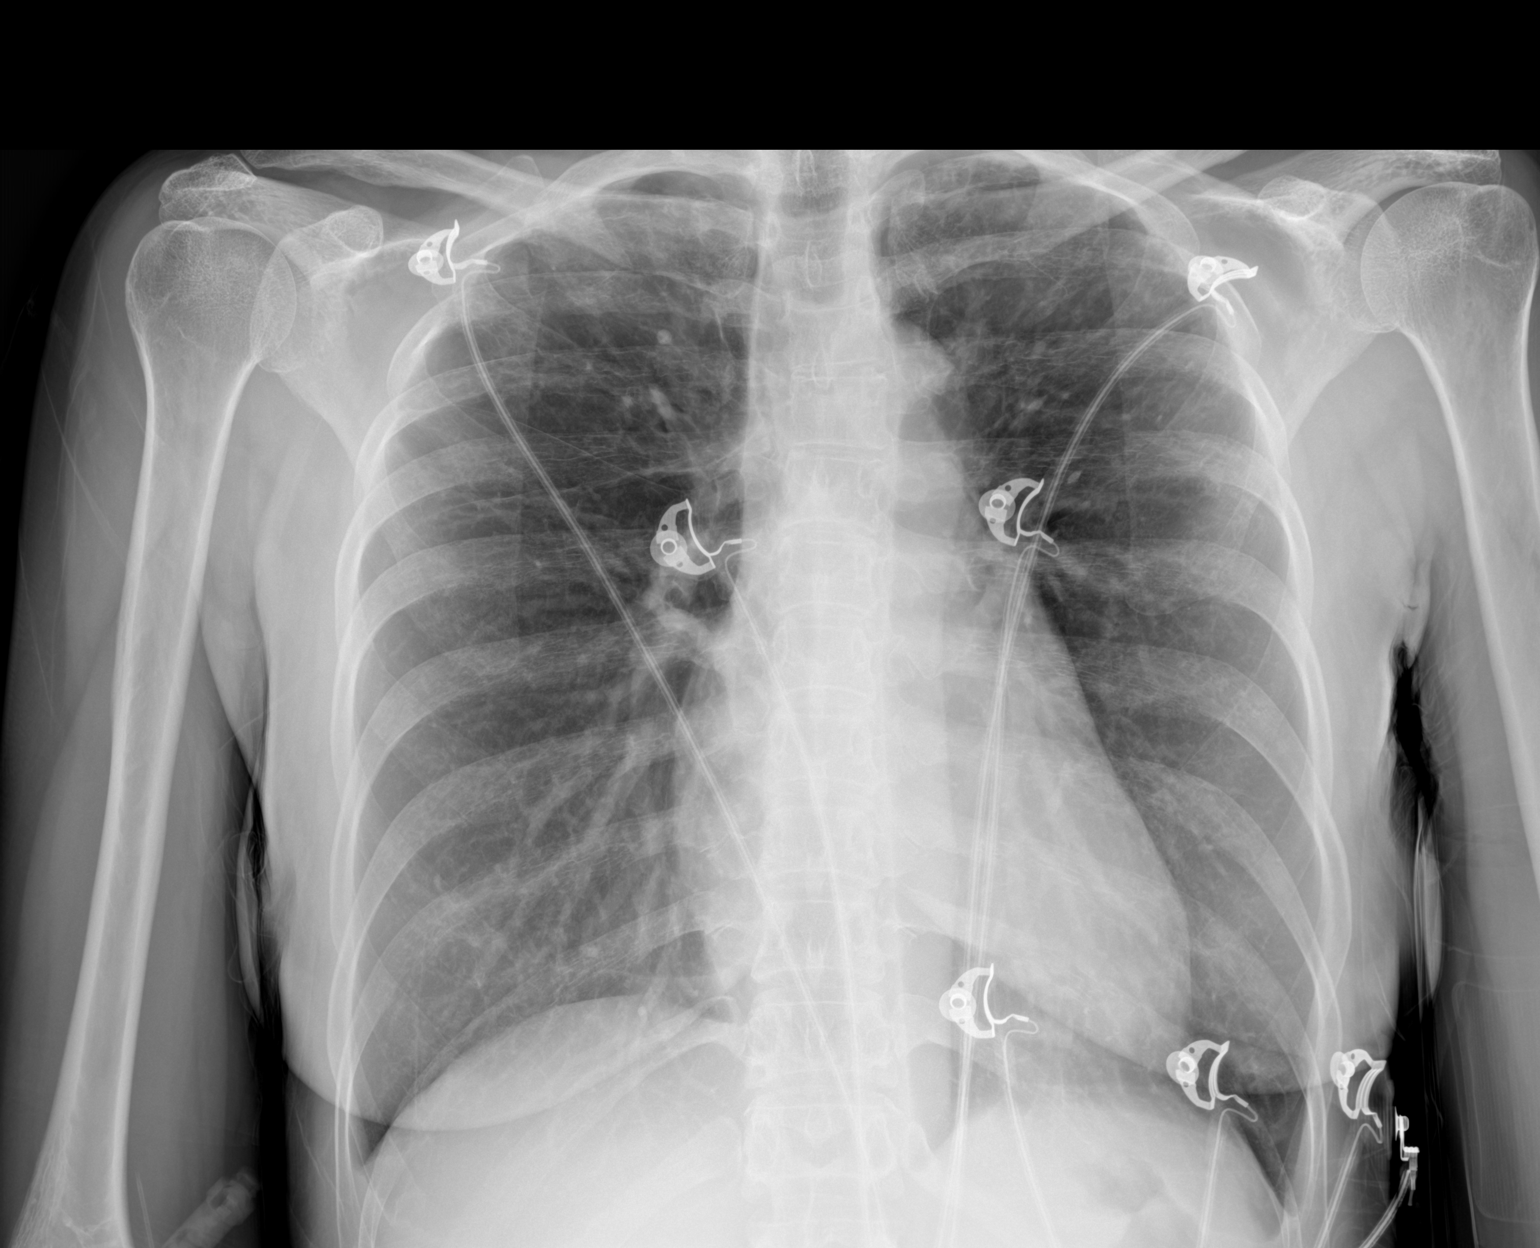

[1 of 1 positions shown; findings below may reference images not displayed]

FINDINGS: The heart size and mediastinal contours are within normal limits.
Both lungs are clear. The visualized skeletal structures are
unremarkable.
IMPRESSION: No active disease.

## 2022-11-19 ENCOUNTER — Encounter: Payer: Self-pay | Admitting: Family Medicine

## 2023-01-19 ENCOUNTER — Ambulatory Visit: Payer: Medicaid Other | Admitting: Family Medicine

## 2023-01-19 ENCOUNTER — Encounter: Payer: Self-pay | Admitting: Family Medicine

## 2023-01-19 ENCOUNTER — Ambulatory Visit (INDEPENDENT_AMBULATORY_CARE_PROVIDER_SITE_OTHER): Payer: Medicaid Other | Admitting: Family Medicine

## 2023-01-19 VITALS — BP 116/70 | HR 77 | Ht 64.5 in | Wt 128.0 lb

## 2023-01-19 DIAGNOSIS — M7542 Impingement syndrome of left shoulder: Secondary | ICD-10-CM

## 2023-01-19 DIAGNOSIS — M25552 Pain in left hip: Secondary | ICD-10-CM

## 2023-01-19 MED ORDER — BACLOFEN 5 MG PO TABS: 5.0000 mg | ORAL_TABLET | Freq: Every evening | ORAL | 0 refills | Status: DC | PRN

## 2023-01-19 MED ORDER — CELECOXIB 50 MG PO CAPS: 50.0000 mg | ORAL_CAPSULE | Freq: Two times a day (BID) | ORAL | 0 refills | Status: DC

## 2023-01-24 ENCOUNTER — Ambulatory Visit: Payer: Medicaid Other | Admitting: Family Medicine

## 2023-01-28 NOTE — Assessment & Plan Note (Signed)
The hip pain had subsided for a period but has recently returned.  Hip Pain Recurrent pain, most consistent with left greater trochanteric pain syndrome. -Start Celebrex (anti-inflammatory) twice daily for two weeks, then as needed. -Consider Baclofen (muscle relaxer) at night as needed for muscle tension. -Plan for possible cortisone injection in two weeks to reduce inflammation. -Continue home-based rehab exercises as tolerated.

## 2023-01-28 NOTE — Progress Notes (Signed)
Primary Care / Sports Medicine Office Visit  Patient Information:  Patient ID: Shanzay Armbrecht, female DOB: May 22, 1970 Age: 52 y.o. MRN: [id number]   Lindi Trias is a pleasant 52 y.o. female presenting with the following:  Chief Complaint  Patient presents with   Shoulder Pain    Patient presents today for left shoulder pain. This has been going on for 3 months. She is having difficulty lifting , carrying things, and with ROM. She has taken IBU for the pain which does not help, she has also used a muscle and joint pain cream which did help. Pain level is 6/10 today. Patient is tender to touch in LUE.   Hip Pain    Patient has been having left hip pain.  Worse when laying down and walking. She has tried ibuprofen and heat which seem to help some. Her pain level is a 6/10. No popping/clicking/catching or locking up. No numbness/ tingling or radiating pain.     Vitals:   01/19/23 1044  BP: 116/70  Pulse: 77  SpO2: 99%   Vitals:   01/19/23 1044  Weight: 128 lb (58.1 kg)  Height: 5' 4.5" (1.638 m)   Body mass index is 21.63 kg/m.  No results found.   Independent interpretation of notes and tests performed by another provider:   None  Procedures performed:   None  Pertinent History, Exam, Impression, and Recommendations:   Problem List Items Addressed This Visit       Musculoskeletal and Integument   Greater trochanteric pain syndrome of left lower extremity    The hip pain had subsided for a period but has recently returned.  Hip Pain Recurrent pain, most consistent with left greater trochanteric pain syndrome. -Start Celebrex (anti-inflammatory) twice daily for two weeks, then as needed. -Consider Baclofen (muscle relaxer) at night as needed for muscle tension. -Plan for possible cortisone injection in two weeks to reduce inflammation. -Continue home-based rehab exercises as tolerated.      Rotator cuff impingement syndrome of left shoulder - Primary     The patient, with a history of shoulder and hip pain, presents with persistent and worsening symptoms despite previous physical therapy. The patient reports that the shoulder pain has been daily and has worsened since the last physical therapy session about a month ago. The pain is particularly severe when reaching overhead or behind the back, and the patient has limited range of motion in the shoulder.   MUSCULOSKELETAL: Pain on external rotation and internal rotation of shoulder.  Positive empty can test. Tenderness at bicipital groove.  Positive speeds and Yergason's. Rotator cuff strength intact but painful on use.  Left Shoulder Pain -most consistent with rotator cuff impingement and associated biceps tendinitis Chronic pain exacerbated by physical therapy. Limited range of motion and daily pain. Possible involvement of subscapular muscle and biceps tendon. -Can use Celebrex as needed -Plan for cortisone injection in two weeks to reduce inflammation. -Continue home-based rehab exercises as tolerated.        Orders & Medications Medications:  Meds ordered this encounter  Medications   Baclofen 5 MG TABS    Sig: Take 1 tablet (5 mg total) by mouth at bedtime as needed (Muscle tightness).    Dispense:  30 tablet    Refill:  0   celecoxib (CELEBREX) 50 MG capsule    Sig: Take 1 capsule (50 mg total) by mouth 2 (two) times daily. X 2 weeks then twice daily as-needed    Dispense:  60 capsule    Refill:  0   No orders of the defined types were placed in this encounter.    No follow-ups on file.     Jerrol Banana, MD, [facility]   Primary Care Sports Medicine Primary Care and Sports Medicine at [facility]

## 2023-01-28 NOTE — Assessment & Plan Note (Signed)
The patient, with a history of shoulder and hip pain, presents with persistent and worsening symptoms despite previous physical therapy. The patient reports that the shoulder pain has been daily and has worsened since the last physical therapy session about a month ago. The pain is particularly severe when reaching overhead or behind the back, and the patient has limited range of motion in the shoulder.   MUSCULOSKELETAL: Pain on external rotation and internal rotation of shoulder.  Positive empty can test. Tenderness at bicipital groove.  Positive speeds and Yergason's. Rotator cuff strength intact but painful on use.  Left Shoulder Pain -most consistent with rotator cuff impingement and associated biceps tendinitis Chronic pain exacerbated by physical therapy. Limited range of motion and daily pain. Possible involvement of subscapular muscle and biceps tendon. -Can use Celebrex as needed -Plan for cortisone injection in two weeks to reduce inflammation. -Continue home-based rehab exercises as tolerated.

## 2023-01-28 NOTE — Patient Instructions (Signed)
-  Start Celebrex (anti-inflammatory) twice daily for two weeks, then as needed. -Consider Baclofen (muscle relaxer) at night as needed for muscle tension. -Plan for possible cortisone injection in two weeks to reduce inflammation. -Continue home-based rehab exercises as tolerated.

## 2023-01-31 ENCOUNTER — Encounter: Payer: Self-pay | Admitting: Family Medicine

## 2023-01-31 NOTE — Telephone Encounter (Signed)
Please review.  KP

## 2023-02-02 ENCOUNTER — Ambulatory Visit: Payer: Medicaid Other | Admitting: Family Medicine

## 2023-02-03 ENCOUNTER — Ambulatory Visit: Payer: Medicaid Other | Admitting: Family Medicine

## 2023-02-03 ENCOUNTER — Encounter: Payer: Self-pay | Admitting: Family Medicine

## 2023-02-03 VITALS — BP 90/64 | HR 68 | Ht 64.5 in | Wt 136.0 lb

## 2023-02-03 DIAGNOSIS — R42 Dizziness and giddiness: Secondary | ICD-10-CM

## 2023-02-03 DIAGNOSIS — M7542 Impingement syndrome of left shoulder: Secondary | ICD-10-CM | POA: Diagnosis not present

## 2023-02-04 ENCOUNTER — Ambulatory Visit: Payer: Self-pay

## 2023-02-11 ENCOUNTER — Ambulatory Visit: Payer: Self-pay | Admitting: *Deleted

## 2023-02-11 ENCOUNTER — Ambulatory Visit: Payer: Medicaid Other | Admitting: Family Medicine

## 2023-02-11 DIAGNOSIS — R42 Dizziness and giddiness: Secondary | ICD-10-CM | POA: Insufficient documentation

## 2023-02-11 NOTE — Assessment & Plan Note (Addendum)
History of Present Illness The patient, with a history of shoulder pain since July, came in for follow-up. However, she reported feeling unwell with symptoms of low blood pressure, lightheadedness, and a feeling of unease. The patient described feeling faint, sluggish, and having a sensation of tunnel vision. The patient has been taking Celebrex for pain, but reported feeling disoriented and having a heavy head after taking it.   Lightheadedness Current BP of 90/64. Patient reports past feelings of being faint, sluggish, and experiencing tunnel vision. Possible causes discussed include hypovolemia secondary to dehydration, inadequate nutrition, and potential iron deficiency. -Recommend urgent care or ER visit for immediate evaluation, including potential fluid administration and immediate lab results. -Discontinue Celebrex. -Consider use of topical Voltaren gel for pain management.

## 2023-02-11 NOTE — Telephone Encounter (Signed)
FYI

## 2023-02-11 NOTE — Telephone Encounter (Signed)
Reason for Disposition  [1] MODERATE dizziness (e.g., interferes with normal activities) AND [2] has NOT been evaluated by doctor (or NP/PA) for this  (Exception: Dizziness caused by heat exposure, sudden standing, or poor fluid intake.)    Patient was evaluated 02/09/23- patient advised follow up with PCP- she has been scheduled for iron infusion  Answer Assessment - Initial Assessment Questions 1. DESCRIPTION: "Describe your dizziness."     Dizziness past few days- did go to ED- 2 days ago 2. LIGHTHEADED: "Do you feel lightheaded?" (e.g., somewhat faint, woozy, weak upon standing)     Somewhat faint 3. VERTIGO: "Do you feel like either you or the room is spinning or tilting?" (i.e. vertigo)     no 4. SEVERITY: "How bad is it?"  "Do you feel like you are going to faint?" "Can you stand and walk?"   - MILD: Feels slightly dizzy, but walking normally.   - MODERATE: Feels unsteady when walking, but not falling; interferes with normal activities (e.g., school, work).   - SEVERE: Unable to walk without falling, or requires assistance to walk without falling; feels like passing out now.      moderate 5. ONSET:  "When did the dizziness begin?"     2 days ago  8. CAUSE: "What do you think is causing the dizziness?"     Unsure- abnormal labs- ferritin low, vit D 9. 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 OTHER SYMPTOMS: "Do you have any other symptoms?" (e.g., fever, chest pain, vomiting, diarrhea, bleeding)       No- feels warm  Protocols used: Dizziness - Lightheadedness-A-AH

## 2023-02-11 NOTE — Telephone Encounter (Signed)
Patient has canceled injection for today Chief Complaint: dizziness- patient went to ED for this and advised low Ferritin- scheduled for infusion  Symptoms: dizziness, hot, feels faint at times Frequency: 2 days Pertinent Negatives: Patient denies fever, chest pain, vomiting, diarrhea, bleeding  Disposition: [] ED /[] Urgent Care (no appt availability in office) / [] Appointment(In office/virtual)/ []  [facility]/ [] Home Care/ [] Refused Recommended Disposition /[] [facility]/ [x]  Follow-up with PCP Additional Notes: Patient advised to call and see if that infusion can be moved up- also advised ED if she gets worse.

## 2023-02-11 NOTE — Assessment & Plan Note (Signed)
History of Present Illness The patient, with a history of shoulder pain since July, returns for follow-up. The patient also reported discomfort in the left arm, which she described as a dull pain. The pain in the arm has been inconsistent, with some days being pain-free and others not. The patient has been using menthol for pain relief, which she reported as being somewhat helpful.  Left shoulder Pain - most considered rotator cuff impingement associated biceps tendinitis Ongoing pain in the shoulder, with continued involvement of the rotator cuff and biceps tendon. Patient reports increased tenderness and discomfort. -Plan for evaluation for cortisone injection in the shoulder at follow-up visit, with potential for targeting one or both areas depending on symptomatic presentation at the time. -Recommend use of Tylenol and ice for pain control post-injection. -Consider MRI if pain persists beyond two weeks post-injection.

## 2023-02-17 ENCOUNTER — Encounter: Payer: Self-pay | Admitting: Family Medicine

## 2023-02-17 ENCOUNTER — Ambulatory Visit: Payer: Medicaid Other | Admitting: Family Medicine

## 2023-02-17 ENCOUNTER — Other Ambulatory Visit (INDEPENDENT_AMBULATORY_CARE_PROVIDER_SITE_OTHER): Payer: Medicaid Other | Admitting: Radiology

## 2023-02-17 VITALS — BP 110/68 | HR 93 | Ht 64.5 in | Wt 134.2 lb

## 2023-02-17 DIAGNOSIS — M65311 Trigger thumb, right thumb: Secondary | ICD-10-CM | POA: Diagnosis not present

## 2023-02-17 DIAGNOSIS — M7522 Bicipital tendinitis, left shoulder: Secondary | ICD-10-CM | POA: Diagnosis not present

## 2023-02-17 DIAGNOSIS — M7542 Impingement syndrome of left shoulder: Secondary | ICD-10-CM | POA: Diagnosis not present

## 2023-02-17 MED ORDER — TRIAMCINOLONE ACETONIDE 40 MG/ML IJ SUSP
40.0000 mg | Freq: Once | INTRAMUSCULAR | Status: AC
Start: 2023-02-17 — End: 2023-02-17
  Administered 2023-02-17: 40 mg via INTRA_ARTICULAR

## 2023-02-17 NOTE — Progress Notes (Signed)
Primary Care / Sports Medicine Office Visit  Patient Information:  Patient ID: Robin Schwartz, female DOB: 1970-12-18 Age: 52 y.o. MRN: [id number]   Robin Schwartz is a pleasant 52 y.o. female presenting with the following:  Chief Complaint  Patient presents with   Shoulder Pain    Patient here for a corticosteroid injection for her left shoulder.     Vitals:   02/17/23 0927  BP: 110/68  Pulse: 93  SpO2: 100%   Vitals:   02/17/23 0927  Weight: 134 lb 3.2 oz (60.9 kg)  Height: 5' 4.5" (1.638 m)   Body mass index is 22.68 kg/m.  No results found.   Independent interpretation of notes and tests performed by another provider:   None  Procedures performed:   Procedure:  Injection of left shoulder subacromial space under ultrasound guidance. Ultrasound guidance utilized for in-plane approach to left subacromial space, no sonographic tendinopathy noted Samsung HS60 device utilized with permanent recording / reporting. Verbal informed consent obtained and verified. Skin prepped in a sterile fashion. Ethyl chloride for topical local analgesia.  Completed without difficulty and tolerated well. Medication: triamcinolone acetonide 40 mg/mL suspension for injection 1 mL total and 2 mL lidocaine 1% without epinephrine utilized for needle placement anesthetic Advised to contact for fevers/chills, erythema, induration, drainage, or persistent bleeding.  Procedure:  Injection of left biceps tendon sheath under ultrasound guidance. Ultrasound guidance utilized for out of plane approach to left biceps tendon sheath, no peritendinous fluid noted Samsung HS60 device utilized with permanent recording / reporting. Verbal informed consent obtained and verified. Skin prepped in a sterile fashion. Ethyl chloride for topical local analgesia.  Completed without difficulty and tolerated well. Medication: triamcinolone acetonide 40 mg/mL suspension for injection 1 mL total and 2 mL  lidocaine 1% without epinephrine utilized for needle placement anesthetic Advised to contact for fevers/chills, erythema, induration, drainage, or persistent bleeding.   Pertinent History, Exam, Impression, and Recommendations:   Problem List Items Addressed This Visit       Musculoskeletal and Integument   Rotator cuff impingement syndrome of left shoulder - Primary   Ongoing condition, returns for left shoulder evaluation and anticipated corticosteroid injection(s).  Examination today demonstrates focality at the biceps tendon and rotator cuff, particular supraspinatus.  Shoulder Impingement (Left), biceps tendinitis (left) considered secondary/compensatory -Patient elected to proceed with ultrasound-guided left subacromial and left biceps tendon sheath corticosteroid injections today -Advised appropriate postinjection care. -If pain recurs or increases after two weeks, consider further intervention.      Relevant Orders   [place] LIMITED JOINT SPACE STRUCTURES UP LEFT   Trigger thumb, right thumb   Patient also brings up atraumatic right thumb pain, ongoing for a few weeks.  Pain associated with swelling and warmth. The pain was severe enough to limit her use of the thumb.  Denies any paresthesias, no treatments to date.  Does endorse increased texting/use of thumb as of late.  MUSCULOSKELETAL: Right thumb non-tender at radial styloid and first carpometacarpal joint, tender at first metacarpophalangeal joint volar aspect, maximally tender at first interphalangeal joint, mild swelling.  Trigger Finger (Right Thumb) Early signs of trigger finger with pain and swelling. No overt triggering endorsed or noted on examination today. -Wear semi-flexible Modabber Thumb spica style brace as much as possible for two weeks, except during sleep, bathing, and driving. -Apply Voltaren gel to the area 2-4 times a day. -If no improvement in two weeks, consider escalation to rigid brace, oral  medications, CSI.  Other Visit Diagnoses       Biceps tendinitis, left [M75.22]       Relevant Medications   triamcinolone acetonide (KENALOG-40) injection 40 mg (Completed)   Other Relevant Orders   [place] LIMITED JOINT SPACE STRUCTURES UP LEFT      I provided a total time of 45 minutes including both face-to-face and non-face-to-face time on 02/17/2023 inclusive of time utilized for medical chart review, information gathering, care coordination with staff, and documentation completion.   Orders & Medications Medications:  Meds ordered this encounter  Medications   triamcinolone acetonide (KENALOG-40) injection 40 mg   Orders Placed This Encounter  Procedures   [place] LIMITED JOINT SPACE STRUCTURES UP LEFT     No follow-ups on file.     Jerrol Banana, MD, [facility]   Primary Care Sports Medicine Primary Care and Sports Medicine at [facility]

## 2023-02-17 NOTE — Patient Instructions (Addendum)
You have just been given a cortisone injection to reduce pain and inflammation. After the injection you may notice immediate relief of pain as a result of the Lidocaine. It is important to rest the area of the injection for 24 to 48 hours after the injection. There is a possibility of some temporary increased discomfort and swelling for up to 72 hours until the cortisone begins to work. If you do have pain, simply rest the joint and use ice. If you can tolerate over the counter medications, you can try Tylenol, Aleve, or Advil for added relief per package instructions. Patient Plan:  1. Trigger Finger (Right Thumb)    - Wear a semi-flexible brace on your thumb as much as possible for the next two weeks, except during sleep, bathing, and driving.    - Apply Voltaren gel to the affected area a few times a day.    - If there is no improvement in two weeks, contact our office for next steps.  2. Shoulder Impingement (Left)    - You received a cortisone shot today to help reduce inflammation in your biceps tendon and rotator cuff.    - Follow instructions above.    - If the pain persists at/after two weeks, contact our office for next steps.

## 2023-02-17 NOTE — Assessment & Plan Note (Signed)
Patient also brings up atraumatic right thumb pain, ongoing for a few weeks.  Pain associated with swelling and warmth. The pain was severe enough to limit her use of the thumb.  Denies any paresthesias, no treatments to date.  Does endorse increased texting/use of thumb as of late.  MUSCULOSKELETAL: Right thumb non-tender at radial styloid and first carpometacarpal joint, tender at first metacarpophalangeal joint volar aspect, maximally tender at first interphalangeal joint, mild swelling.  Trigger Finger (Right Thumb) Early signs of trigger finger with pain and swelling. No overt triggering endorsed or noted on examination today. -Wear semi-flexible Modabber Thumb spica style brace as much as possible for two weeks, except during sleep, bathing, and driving. -Apply Voltaren gel to the area 2-4 times a day. -If no improvement in two weeks, consider escalation to rigid brace, oral medications, CSI.

## 2023-02-17 NOTE — Assessment & Plan Note (Signed)
Ongoing condition, returns for left shoulder evaluation and anticipated corticosteroid injection(s).  Examination today demonstrates focality at the biceps tendon and rotator cuff, particular supraspinatus.  Shoulder Impingement (Left), biceps tendinitis (left) considered secondary/compensatory -Patient elected to proceed with ultrasound-guided left subacromial and left biceps tendon sheath corticosteroid injections today -Advised appropriate postinjection care. -If pain recurs or increases after two weeks, consider further intervention.

## 2023-02-21 ENCOUNTER — Encounter: Payer: Self-pay | Admitting: Family Medicine

## 2023-02-21 NOTE — Progress Notes (Signed)
     Primary Care / Sports Medicine Office Visit  Patient Information:  Patient ID: Robin Schwartz, female DOB: 09/26/1970 Age: 52 y.o. MRN: [id number]   Robin Schwartz is a pleasant 52 y.o. female presenting with the following:  Chief Complaint  Patient presents with   Shoulder Pain    Left shoulder pain/tingling x 2 weeks, no numbness, no radicular pain. Patient is not taking any medication for pain. Lifting , carry ing and ROM are aggravating factors. No therapy for left shoulder.    Vitals:   02/03/23 1537  BP: 90/64  Pulse: 68  SpO2: 98%   Vitals:   02/03/23 1537  Weight: 136 lb (61.7 kg)  Height: 5' 4.5" (1.638 m)   Body mass index is 22.98 kg/m.     Independent interpretation of notes and tests performed by another provider:   None  Procedures performed:   None  Pertinent History, Exam, Impression, and Recommendations:   Problem List Items Addressed This Visit       Musculoskeletal and Integument   Rotator cuff impingement syndrome of left shoulder - Primary   History of Present Illness The patient, with a history of shoulder pain since July, returns for follow-up. The patient also reported discomfort in the left arm, which she described as a dull pain. The pain in the arm has been inconsistent, with some days being pain-free and others not. The patient has been using menthol for pain relief, which she reported as being somewhat helpful.  Left shoulder Pain - most considered rotator cuff impingement associated biceps tendinitis Ongoing pain in the shoulder, with continued involvement of the rotator cuff and biceps tendon. Patient reports increased tenderness and discomfort. -Plan for evaluation for cortisone injection in the shoulder at follow-up visit, with potential for targeting one or both areas depending on symptomatic presentation at the time. -Recommend use of Tylenol and ice for pain control post-injection. -Consider MRI if pain persists beyond  two weeks post-injection.        Other   Lightheadedness   History of Present Illness The patient, with a history of shoulder pain since July, came in for follow-up. However, she reported feeling unwell with symptoms of low blood pressure, lightheadedness, and a feeling of unease. The patient described feeling faint, sluggish, and having a sensation of tunnel vision. The patient has been taking Celebrex for pain, but reported feeling disoriented and having a heavy head after taking it.   Lightheadedness Current BP of 90/64. Patient reports past feelings of being faint, sluggish, and experiencing tunnel vision. Possible causes discussed include hypovolemia secondary to dehydration, inadequate nutrition, and potential iron deficiency. -Recommend urgent care or ER visit for immediate evaluation, including potential fluid administration and immediate lab results. -Discontinue Celebrex. -Consider use of topical Voltaren gel for pain management.      I provided a total time of 48 minutes including both face-to-face and non-face-to-face time on 02/21/2023 inclusive of time utilized for medical chart review, information gathering, care coordination with staff, and documentation completion.   Orders & Medications Medications: No orders of the defined types were placed in this encounter.  No orders of the defined types were placed in this encounter.    No follow-ups on file.     Jerrol Banana, MD, [facility]   Primary Care Sports Medicine Primary Care and Sports Medicine at [facility]

## 2023-02-22 NOTE — Telephone Encounter (Signed)
Please review.  KP

## 2023-02-24 ENCOUNTER — Emergency Department: Payer: Medicaid Other

## 2023-02-24 ENCOUNTER — Other Ambulatory Visit: Payer: Self-pay

## 2023-02-24 DIAGNOSIS — R410 Disorientation, unspecified: Secondary | ICD-10-CM | POA: Diagnosis not present

## 2023-02-24 DIAGNOSIS — Z5321 Procedure and treatment not carried out due to patient leaving prior to being seen by health care provider: Secondary | ICD-10-CM | POA: Insufficient documentation

## 2023-02-24 DIAGNOSIS — R519 Headache, unspecified: Secondary | ICD-10-CM | POA: Diagnosis present

## 2023-02-24 DIAGNOSIS — R079 Chest pain, unspecified: Secondary | ICD-10-CM | POA: Insufficient documentation

## 2023-02-24 LAB — CBC
HCT: 38.1 % (ref 36.0–46.0)
Hemoglobin: 11.9 g/dL — ABNORMAL LOW (ref 12.0–15.0)
MCH: 25.3 pg — ABNORMAL LOW (ref 26.0–34.0)
MCHC: 31.2 g/dL (ref 30.0–36.0)
MCV: 81.1 fL (ref 80.0–100.0)
Platelets: 403 10*3/uL — ABNORMAL HIGH (ref 150–400)
RBC: 4.7 MIL/uL (ref 3.87–5.11)
RDW: 15.9 % — ABNORMAL HIGH (ref 11.5–15.5)
WBC: 9.5 10*3/uL (ref 4.0–10.5)
nRBC: 0 % (ref 0.0–0.2)

## 2023-02-24 LAB — BASIC METABOLIC PANEL
Anion gap: 13 (ref 5–15)
BUN: 17 mg/dL (ref 6–20)
CO2: 23 mmol/L (ref 22–32)
Calcium: 9.3 mg/dL (ref 8.9–10.3)
Chloride: 98 mmol/L (ref 98–111)
Creatinine, Ser: 0.56 mg/dL (ref 0.44–1.00)
GFR, Estimated: >60 mL/min (ref >60)
Glucose, Bld: 102 mg/dL — ABNORMAL HIGH (ref 70–99)
Potassium: 3.5 mmol/L (ref 3.5–5.1)
Sodium: 134 mmol/L — ABNORMAL LOW (ref 135–145)

## 2023-02-24 LAB — TROPONIN I (HIGH SENSITIVITY): Troponin I (High Sensitivity): 4 ng/L (ref <18)

## 2023-02-24 NOTE — ED Triage Notes (Signed)
 Pt to ED via POV from home. [place] and felt like she couldn't feel her limbs. Pt reports episodes of confusion have been intermittent. Pt also reports having CP that started on the way here. Pt also reports HA. Pt denies fall or medication changes. Pt had cortisone shot on Thursday and iron infusion on Sunday.

## 2023-02-25 ENCOUNTER — Emergency Department
Admission: EM | Admit: 2023-02-25 | Discharge: 2023-02-25 | Discharge status: Against Medical Advice | Payer: Medicaid Other | Attending: Emergency Medicine | Admitting: Emergency Medicine

## 2023-02-25 NOTE — ED Notes (Signed)
 No answer when called several times from lobby

## 2023-03-09 ENCOUNTER — Ambulatory Visit: Payer: Medicaid Other | Admitting: Family Medicine

## 2023-03-09 DIAGNOSIS — N921 Excessive and frequent menstruation with irregular cycle: Secondary | ICD-10-CM | POA: Insufficient documentation

## 2023-03-10 ENCOUNTER — Encounter: Payer: Self-pay | Admitting: Family Medicine

## 2023-03-10 ENCOUNTER — Ambulatory Visit (INDEPENDENT_AMBULATORY_CARE_PROVIDER_SITE_OTHER): Payer: Medicaid Other | Admitting: Family Medicine

## 2023-03-10 VITALS — BP 102/68 | HR 75 | Ht 64.5 in | Wt 132.6 lb

## 2023-03-10 DIAGNOSIS — M7542 Impingement syndrome of left shoulder: Secondary | ICD-10-CM | POA: Diagnosis not present

## 2023-03-10 DIAGNOSIS — M7522 Bicipital tendinitis, left shoulder: Secondary | ICD-10-CM

## 2023-03-10 DIAGNOSIS — M47812 Spondylosis without myelopathy or radiculopathy, cervical region: Secondary | ICD-10-CM

## 2023-03-15 ENCOUNTER — Other Ambulatory Visit: Payer: Self-pay | Admitting: Family Medicine

## 2023-03-15 DIAGNOSIS — M7542 Impingement syndrome of left shoulder: Secondary | ICD-10-CM

## 2023-03-15 DIAGNOSIS — M7522 Bicipital tendinitis, left shoulder: Secondary | ICD-10-CM | POA: Insufficient documentation

## 2023-03-15 DIAGNOSIS — M47812 Spondylosis without myelopathy or radiculopathy, cervical region: Secondary | ICD-10-CM

## 2023-03-15 NOTE — Assessment & Plan Note (Signed)
See additional assessment(s) for plan details. 

## 2023-03-15 NOTE — Patient Instructions (Addendum)
Patient Plan for Cervical Spine and Left Shoulder Issues  1. 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    - Continue use of topical menthol as tolerated for symptomatic relief.    - Consider physical therapy with a focus on pain control.  2. Rotator Cuff Impingement Syndrome of the Left Shoulder:    - Order MRI of the left shoulder to assess for tendinopathy throughout the rotator cuff and biceps tendon, and evaluate shoulder soft tissue structures.    - In the interim, continue gentle pain control methods with over-the-counter formulations, including topical menthol, and pursue physical therapy as tolerated.  Advised to contact the office with any concerns or if symptoms worsen.

## 2023-03-15 NOTE — Telephone Encounter (Signed)
Reached out to referrals team. Angelique Blonder is working on getting the pt scheduled.  KP

## 2023-03-15 NOTE — Progress Notes (Signed)
     Primary Care / Sports Medicine Office Visit  Patient Information:  Patient ID: Robin Schwartz, female DOB: February 23, 1971 Age: 53 y.o. MRN: [id number]   Robin Schwartz is a pleasant 53 y.o. female presenting with the following:  Chief Complaint  Patient presents with   Shoulder Pain    Left shoulder pain since injection, pain has gotten worse and injection did not help at all. Stiffness is worse and patient can't lift arm now.    Vitals:   03/10/23 0920  BP: 102/68  Pulse: 75  SpO2: 98%   Vitals:   03/10/23 0920  Weight: 132 lb 9.6 oz (60.1 kg)  Height: 5' 4.5" (1.638 m)   Body mass index is 22.41 kg/m.     Independent interpretation of notes and tests performed by another provider:   None  Procedures performed:   None  Pertinent History, Exam, Impression, and Recommendations:   Problem List Items Addressed This Visit     Biceps tendinitis, left [M75.22]   See additional assessment(s) for plan details.      Cervical spondylosis   IMAGING: CERVICAL SPINE - COMPLETE 4+ VIEW 08/19/2020   FINDINGS: Minimal grade 1 retrolisthesis is noted at C4-5 and C5-6 secondary to moderate degenerative disc disease at these levels. No definite fracture is noted. No prevertebral soft tissue swelling is noted. No significant neural foraminal stenosis is noted.  ASSESSMENT & PLAN Right sided cervical Radiculopathy in the setting of cervical spondylosis Positive Spurling's test raising concern for cervical spine involvement. -Order MRI of the cervical spine to assess for nerve root compression. -Continue use of topical menthol as tolerated. -Consider physical therapy with a focus on pain control. -Consider referral to neurosurgery group for further evaluation and potential intervention pending MRI results.      Rotator cuff impingement syndrome of left shoulder - Primary   The patient, following up for left shoulder pain and limited range of motion, presents with  worsening shoulder pain despite receiving a cortisone shot at last visit on 02/17/2023. The patient describes the pain as a dull sensation in the arm, making it feel weighted. The patient also reports occasional numbness in the arm and a change in the type of pain experienced.  MUSCULOSKELETAL: Strength preserved throughout rotator cuff including isolated supraspinatus testing. Resisted RC testing elicits interval improved pain. Nontender bicipital groove. Provocative testing otherwise benign. NEUROLOGICAL: Positive Spurling's test on the right.  Left Shoulder Pain  Worsening pain and limited mobility despite cortisone injection. Suspected rotator cuff involvement and possible biceps tendon issues. -Order MRI of the left shoulder to assess for tendinopathy throughout the rotator cuff and biceps tendon in addition to shoulder soft tissue structures. -Pending MRI results, can consider referral to orthopedic group for further evaluation and potential surgical intervention. -Over the interim patient can continue gentle pain control methods with OTC formulations (patient noting benefit from topical menthol) and physical therapy as tolerated.      I provided a total time of 43 minutes including both face-to-face and non-face-to-face time on 03/15/2023 inclusive of time utilized for medical chart review, information gathering, care coordination with staff, and documentation completion.  Orders & Medications Medications: No orders of the defined types were placed in this encounter.  No orders of the defined types were placed in this encounter.    No follow-ups on file.     Jerrol Banana, MD, [facility]   Primary Care Sports Medicine Primary Care and Sports Medicine at [facility]

## 2023-03-15 NOTE — Assessment & Plan Note (Addendum)
IMAGING: CERVICAL SPINE - COMPLETE 4+ VIEW 08/19/2020   FINDINGS: Minimal grade 1 retrolisthesis is noted at C4-5 and C5-6 secondary to moderate degenerative disc disease at these levels. No definite fracture is noted. No prevertebral soft tissue swelling is noted. No significant neural foraminal stenosis is noted.  ASSESSMENT & PLAN Right sided cervical Radiculopathy in the setting of cervical spondylosis Positive Spurling's test raising concern for cervical spine involvement. -Order MRI of the cervical spine to assess for nerve root compression. -Continue use of topical menthol as tolerated. -Consider physical therapy with a focus on pain control. -Consider referral to neurosurgery group for further evaluation and potential intervention pending MRI results.

## 2023-03-15 NOTE — Telephone Encounter (Signed)
 Please review. Thank you JM

## 2023-03-15 NOTE — Assessment & Plan Note (Signed)
The patient, following up for left shoulder pain and limited range of motion, presents with worsening shoulder pain despite receiving a cortisone shot at last visit on 02/17/2023. The patient describes the pain as a dull sensation in the arm, making it feel weighted. The patient also reports occasional numbness in the arm and a change in the type of pain experienced.  MUSCULOSKELETAL: Strength preserved throughout rotator cuff including isolated supraspinatus testing. Resisted RC testing elicits interval improved pain. Nontender bicipital groove. Provocative testing otherwise benign. NEUROLOGICAL: Positive Spurling's test on the right.  Left Shoulder Pain  Worsening pain and limited mobility despite cortisone injection. Suspected rotator cuff involvement and possible biceps tendon issues. -Order MRI of the left shoulder to assess for tendinopathy throughout the rotator cuff and biceps tendon in addition to shoulder soft tissue structures. -Pending MRI results, can consider referral to orthopedic group for further evaluation and potential surgical intervention. -Over the interim patient can continue gentle pain control methods with OTC formulations (patient noting benefit from topical menthol) and physical therapy as tolerated.

## 2023-03-17 ENCOUNTER — Other Ambulatory Visit: Payer: Self-pay

## 2023-03-21 ENCOUNTER — Telehealth: Payer: Self-pay

## 2023-03-21 NOTE — Telephone Encounter (Signed)
Patient had schedule a appointment with Dr. Allegra Lai on Mychart but canceled the appointment due to patient being a Dr. Tobi Bastos patient. I called patient and left a message and sent her a mychart message.  She sent back and states I live only 5 minutes from the [place] location and unable to drive on the freeway often times.   Are you will to see patient Dr. Allegra Lai and Dr. Tobi Bastos is it okay if she switches to Dr. Allegra Lai.  She has seen [facility] back in 02/06/2021, then Dr. Tobi Bastos on 10/29/2020, saw [facility] 04/18/2020

## 2023-03-21 NOTE — Telephone Encounter (Signed)
Patient is a establish patient with Dr. Tobi Bastos and she schedule her a appointment through [facility] to see Dr. Allegra Lai. Patient needs to see Dr. Tobi Bastos not Dr. Allegra Lai since she is a establish patient with him. Called patient and left her a message for call back and canceled the appointment. Sent mychart message to patient also

## 2023-03-21 NOTE — Telephone Encounter (Signed)
Put patient back on the schedule for 03/22/2023 at 2:45

## 2023-03-22 ENCOUNTER — Encounter: Payer: Self-pay | Admitting: Gastroenterology

## 2023-03-22 ENCOUNTER — Other Ambulatory Visit: Payer: Self-pay

## 2023-03-22 ENCOUNTER — Ambulatory Visit (INDEPENDENT_AMBULATORY_CARE_PROVIDER_SITE_OTHER): Payer: Medicaid Other | Admitting: Gastroenterology

## 2023-03-22 ENCOUNTER — Ambulatory Visit: Payer: Medicaid Other | Admitting: Gastroenterology

## 2023-03-22 VITALS — BP 134/82 | HR 86 | Temp 97.9°F | Ht 64.5 in | Wt 130.0 lb

## 2023-03-22 DIAGNOSIS — R1013 Epigastric pain: Secondary | ICD-10-CM | POA: Diagnosis not present

## 2023-03-22 DIAGNOSIS — R634 Abnormal weight loss: Secondary | ICD-10-CM

## 2023-03-22 DIAGNOSIS — K219 Gastro-esophageal reflux disease without esophagitis: Secondary | ICD-10-CM

## 2023-03-22 DIAGNOSIS — D500 Iron deficiency anemia secondary to blood loss (chronic): Secondary | ICD-10-CM

## 2023-03-22 DIAGNOSIS — R1319 Other dysphagia: Secondary | ICD-10-CM

## 2023-03-22 DIAGNOSIS — N920 Excessive and frequent menstruation with regular cycle: Secondary | ICD-10-CM

## 2023-03-22 DIAGNOSIS — R1310 Dysphagia, unspecified: Secondary | ICD-10-CM

## 2023-03-22 MED ORDER — OMEPRAZOLE 40 MG PO CPDR: 40.0000 mg | DELAYED_RELEASE_CAPSULE | Freq: Every day | ORAL | 11 refills | Status: DC

## 2023-03-22 NOTE — Progress Notes (Signed)
Robin Repress, MD [address]  [address]  [place], [place] [address]  Main: [phone]  Fax: [phone]    Gastroenterology Consultation  Referring Provider:     Elzie Schwartz, * Primary Care Physician:  Robin Rings, PA-C Primary Gastroenterologist:  Dr. Arlyss Schwartz Reason for Consultation:   Iron deficiency anemia        HPI:   Robin Schwartz is a 53 y.o. female referred by Robin Rings, PA-C  for consultation & management of history of iron deficiency anemia.  She states that for last 5 weeks, she has been having continuous menstrual bleeding and has an appointment to see OB/GYN on 2/7.  She has been symptomatic with ongoing blood loss, her symptoms include leg cramps, fatigue, loss of appetite, weight loss, worsening headaches, feeling aery/scary.  She always has chronic iron deficiency with intermittent anemia, her serum ferritin dropped to 9 in 12/24, received IV iron and taking oral iron daily, ferritin levels improved to 113 as of 03/18/2023. Her hemoglobin was 10.5 in 09/2022, improved to 11.9 in 02/2023.  Most recently 12.4 last week, no evidence of B12 or folate deficiency.  She also reports 1 month history of epigastric burning, food sitting in her lower chest, choking.  She is taking omeprazole 40 mg as needed only.  She does have history of anxiety, migraine headaches for which she has been taking ibuprofen alternating with Tylenol.  She has sumatriptan but has not taken it  She was previously seen by GI, underwent upper endoscopy and colonoscopy including biopsies of the duodenum negative for celiac disease. At [facility] on 12/20/2019 which were both unremarkable, 04/02/2020 HIDA scan ejection fraction 97%, right upper quadrant ultrasound in November 2021 showed no gross abnormality. 2021 CT scan of the abdomen pelvis showed mild to moderate rectal and moderate ascending colon marked stool burden.     NSAIDs: Celebrex and  ibuprofen  Antiplts/Anticoagulants/Anti thrombotics: None  GI Procedures:  EGD and colonoscopy in 2021  A.  DUODENUM, BIOPSY:  -BENIGN DUODENAL MUCOSA WITH NO SIGNIFICANT PATHOLOGIC FINDINGS  -NEGATIVE FOR CELIAC SPRUE    B.  COLON, SIGMOID, BIOPSY:  -HYPERPLASTIC POLYP.  NO ADENOMATOUS MUCOSA IS IDENTIFIED.   Past Medical History:  Diagnosis Date   Anemia    Anxiety    Migraine     Past Surgical History:  Procedure Laterality Date   BREAST EXCISIONAL BIOPSY Left 1992   CYSTOSCOPY  2003   LIPOMA EXCISION Left 2007   shoulder     Current Outpatient Medications:    celecoxib (CELEBREX) 100 MG capsule, Take 100 mg by mouth 2 (two) times daily., Disp: , Rfl:    cholecalciferol (VITAMIN D3) 25 MCG (1000 UNIT) tablet, Take 1,000 Units by mouth daily., Disp: , Rfl:    ibuprofen (ADVIL) 600 MG tablet, Take 1 tablet by mouth every 6 (six) hours as needed., Disp: , Rfl:    Iron Combinations (CHROMAGEN) capsule, Take 1 capsule by mouth daily., Disp: , Rfl:    LORazepam (ATIVAN) 1 MG tablet, Take 1 mg by mouth every 8 (eight) hours as needed for anxiety., Disp: , Rfl:    omeprazole (PRILOSEC) 40 MG capsule, Take 1 capsule (40 mg total) by mouth daily before breakfast., Disp: 30 capsule, Rfl: 11   Vitamin D, Ergocalciferol, (DRISDOL) 1.25 MG (50000 UNIT) CAPS capsule, Take 50,000 Units by mouth once a week., Disp: , Rfl:    AJOVY 225 MG/1.5ML SOAJ, INJECT 225 MG SUBCUTANEOUSLY MONTHLY (  Patient not taking: Reported on 03/22/2023), Disp: , Rfl:    budesonide-formoterol (SYMBICORT) 80-4.5 MCG/ACT inhaler, Inhale 2 puffs into the lungs 2 (two) times daily. (Patient not taking: Reported on 03/22/2023), Disp: , Rfl:    Family History  Problem Relation Age of Onset   Stroke Mother    COPD Maternal Aunt    Hypertension Maternal Uncle    Asthma Maternal Uncle    COPD Maternal Uncle    Diabetes Maternal Grandmother    Asthma Maternal Grandmother    Cancer Maternal Grandfather    Colon  cancer Maternal Grandfather      Social History   Tobacco Use   Smoking status: Never   Smokeless tobacco: Never  Vaping Use   Vaping status: Never Used  Substance Use Topics   Alcohol use: Not Currently   Drug use: Never    Allergies as of 03/22/2023 - Review Complete 03/22/2023  Allergen Reaction Noted   Iodinated contrast media Itching 01/02/2020   Hydrocodone Other (See Comments) 04/12/2019   Hydrocodone-acetaminophen Other (See Comments) 06/09/2011   Propoxyphene Other (See Comments) and Rash 06/09/2011    Review of Systems:    All systems reviewed and negative except where noted in HPI.   Physical Exam:  BP 134/82 (BP Location: Left Arm, Patient Position: Sitting, Cuff Size: Normal)   Pulse 86   Temp 97.9 F (36.6 C) (Oral)   Ht 5' 4.5" (1.638 m)   Wt 130 lb (59 kg)   BMI 21.97 kg/m  No LMP recorded.  General:   Alert,  Well-developed, well-nourished, pleasant and cooperative in NAD Head:  Normocephalic and atraumatic. Eyes:  Sclera clear, no icterus.   Conjunctiva pink. Ears:  Normal auditory acuity. Nose:  No deformity, discharge, or lesions. Mouth:  No deformity or lesions,oropharynx pink & moist. Neck:  Supple; no masses or thyromegaly. Lungs:  Respirations even and unlabored.  Clear throughout to auscultation.   No wheezes, crackles, or rhonchi. No acute distress. Heart:  Regular rate and rhythm; no murmurs, clicks, rubs, or gallops. Abdomen:  Normal bowel sounds. Soft, non-tender and non-distended without masses, hepatosplenomegaly or hernias noted.  No guarding or rebound tenderness.   Rectal: Not performed Msk:  Symmetrical without gross deformities. Good, equal movement & strength bilaterally. Pulses:  Normal pulses noted. Extremities:  No clubbing or edema.  No cyanosis. Neurologic:  Alert and oriented x3;  grossly normal neurologically. Skin:  Intact without significant lesions or rashes. No jaundice. Psych:  Alert and cooperative. Normal mood and  affect.  Imaging Studies: Reviewed  Assessment and Plan:   Robin Schwartz is a 53 y.o. female with history of anxiety, migraine headaches, chronic intermittent iron deficiency anemia, long-term NSAID use is seen in consultation for worsening iron deficiency anemia and 1 month history of epigastric burning, burning in the chest as well as difficulty swallowing, gradual weight loss.  Also, 5 weeks history of menorrhagia  Iron deficiency anemia secondary to chronic blood loss: Responded to iron therapy GI workup in the past was unremarkable Most likely etiology is menstrual bleeding for last 5 weeks Discussed with patient that after complete evaluation by OB/GYN, if she continues to have iron deficiency anemia, recommend bidirectional endoscopy and video capsule endoscopy Continue oral iron replacement Follow-up with OB/GYN  Epigastric burning, GERD and dysphagia Recommend omeprazole 40 mg daily before meals If her symptoms are persistent after 1 month, recommend EGD for further evaluation  Weight loss, menorrhagia Check thyroid panel  Follow up after the above workup  Robin Repress, MD

## 2023-03-23 DIAGNOSIS — R634 Abnormal weight loss: Secondary | ICD-10-CM

## 2023-03-23 LAB — T3: T3, Total: 92 ng/dL (ref 71–180)

## 2023-03-23 LAB — TSH: TSH: 1.02 u[IU]/mL (ref 0.450–4.500)

## 2023-03-23 LAB — T4, FREE: Free T4: 1.41 ng/dL (ref 0.82–1.77)

## 2023-03-24 ENCOUNTER — Encounter: Payer: Self-pay | Admitting: Gastroenterology

## 2023-03-30 ENCOUNTER — Ambulatory Visit (INDEPENDENT_AMBULATORY_CARE_PROVIDER_SITE_OTHER): Payer: Medicaid Other

## 2023-03-30 ENCOUNTER — Encounter: Payer: Self-pay | Admitting: Family Medicine

## 2023-03-30 ENCOUNTER — Ambulatory Visit
Admission: EM | Admit: 2023-03-30 | Discharge: 2023-03-30 | Disposition: A | Discharge status: Home / Self Care | Payer: Medicaid Other | Attending: Family Medicine | Admitting: Family Medicine

## 2023-03-30 DIAGNOSIS — R079 Chest pain, unspecified: Secondary | ICD-10-CM

## 2023-03-30 DIAGNOSIS — R0602 Shortness of breath: Secondary | ICD-10-CM

## 2023-03-30 LAB — GLUCOSE, POCT (MANUAL RESULT ENTRY): POC Glucose: 107 mg/dL — AB (ref 70–99)

## 2023-03-30 NOTE — Telephone Encounter (Signed)
 Please review and advise. Thank you  JM

## 2023-03-30 NOTE — Discharge Instructions (Addendum)
 Follow up with your pulmonologist and cardiologist as discussed.  Your chest xray did not show evidence of pneumonia or broken bones though the radiologist has not yet read it. If they find something that I didn't, I will call you.    Go to ED for red flag symptoms, including; worsening chest pain that is accompanied by nausea, vomiting, sweating, pain in your neck or jaw. Severe headaches, vision changes, numbness/weakness in part of the body, lethargy, confusion, terrible vomiting, severe dehydration, breathing difficulty, severe persistent abdominal, feeling faint or passing out, dizziness, etc. You should especially go to the ED for sudden acute worsening of condition if you do not elect to go at this time.

## 2023-03-30 NOTE — ED Triage Notes (Signed)
 Pt c/o chest pressure & sob x2 mon. States feels like a crushing sensation. Pain radiates to back. States sob comes & goes. Denies any cardiac hx,asthma or COPD.

## 2023-03-30 NOTE — ED Provider Notes (Signed)
 [facility]    CSN: [id number] Arrival date & time: 03/30/23  1201      History   Chief Complaint Chief Complaint  Patient presents with   Shortness of Breath   Rib Pain    HPI Robin Schwartz is a 53 y.o. female.   HPI  History obtained from the patient. Robin Schwartz presents for crushing chest pain and shortness of breath that started about 2 months.  This morning, her symptoms got more intense.  It feels like muscle or rib pain that gets worse when she lays down.  She is not able to get comfortable. When something is pressing on her back. When she has the pressure in her chest it  gives her some shortness of breath.  She does get short of breath with walking up the stairs and at rest. Took ibuprofen  without relief.    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 S  She has a rheumatology put hasn't been to them in about 2 years.     Past Medical History:  Diagnosis Date   Anemia    Anxiety    Migraine     Patient Active Problem List   Diagnosis Date Noted   Biceps tendinitis, left [M75.22] 03/15/2023   Prolonged menstruation 03/09/2023   Trigger thumb, right thumb 02/17/2023   Lightheadedness 02/11/2023   Rotator cuff impingement syndrome of left shoulder 09/10/2022   Greater trochanteric pain syndrome of left lower extremity 09/10/2022   Neuropathy 04/12/2022   Angioedema 04/12/2022   Migraine headache 04/12/2022   Numbness and tingling of both lower extremities 07/17/2021   Postural dizziness with presyncope 07/17/2021   Weakness of both lower extremities 05/15/2021   New daily persistent headache 05/15/2021   Trigeminal neuralgia 03/11/2021   Nocturnal foot cramps 02/04/2021   Panic disorder 10/20/2020   BMI 20.0-20.9, adult 10/02/2020   Chronic fatigue 10/02/2020   History of COVID-19 09/11/2020   Bilateral primary osteoarthritis of knee 09/09/2020   Weight loss, unintentional 09/09/2020   Cervical spondylosis  09/01/2020   Anemia 08/19/2020   Cervical paraspinous muscle spasm 08/19/2020   Thickened endometrium 08/14/2020   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   Iron deficiency anemia 05/02/2020   Heart palpitations 03/06/2020   Hypersomnia 06/11/2019   Seasonal allergies 06/07/2019   Dysmenorrhea 02/21/2019   MEE (middle ear effusion), bilateral 02/21/2019   Cervical stenosis of spine 06/08/2018   Hand joint pain 06/08/2018   Positive antinuclear antibody 06/08/2018   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   Chronic abdominal pain 10/10/2014   Headache disorder 12/12/2013   Patellofemoral dysfunction 11/15/2013   Stress incontinence 08/10/2013   Lumbosacral pain 08/09/2013   Bursitis of both hips 08/09/2013   Other bursitis of hip, unspecified hip 08/09/2013   Personal history of other endocrine, nutritional and metabolic disease 94/97/7984   Iliotibial band syndrome 05/31/2013   Irritable bowel syndrome with constipation 04/27/2013   Gastroesophageal reflux disease 04/04/2013   Eustachian tube dysfunction 11/29/2012   Lesion of sciatic nerve 09/27/2012   Piriformis syndrome 09/27/2012   Generalized anxiety disorder 03/28/2012   Skin sensation disturbance 06/01/2011   Disequilibrium 06/01/2011    Past Surgical History:  Procedure Laterality Date   BREAST EXCISIONAL BIOPSY Left 1992   CYSTOSCOPY  2003   LIPOMA EXCISION Left 2007   shoulder    OB History   No obstetric history on file.      Home Medications    Prior to Admission medications   Medication Sig  Start Date End Date Taking? Authorizing Provider  AJOVY 225 MG/1.5ML SOAJ  01/24/23  Yes [provider]  budesonide-formoterol (SYMBICORT) 80-4.5 MCG/ACT inhaler Inhale 2 puffs into the lungs 2 (two) times daily. 10/13/22 10/13/23 Yes [provider]  busPIRone (BUSPAR) 5 MG tablet Take 1 tablet (5 mg total) by mouth in the morning. 03/24/23 03/23/24 Yes [provider]  celecoxib  (CELEBREX ) 100 MG  capsule Take 100 mg by mouth 2 (two) times daily. 12/22/22  Yes [provider]  cholecalciferol (VITAMIN D3) 25 MCG (1000 UNIT) tablet Take 1,000 Units by mouth daily.   Yes [provider]  ibuprofen  (ADVIL ) 600 MG tablet Take 1 tablet by mouth every 6 (six) hours as needed.   Yes [provider]  Iron Combinations (CHROMAGEN) capsule Take 1 capsule by mouth daily.   Yes [provider]  LORazepam  (ATIVAN ) 1 MG tablet Take 1 mg by mouth every 8 (eight) hours as needed for anxiety. 12/28/22  Yes [provider]  omeprazole  (PRILOSEC) 40 MG capsule Take 1 capsule (40 mg total) by mouth daily before breakfast. 03/22/23  Yes Vanga, Corinn Skiff, MD  Vitamin D , Ergocalciferol , (DRISDOL) 1.25 MG (50000 UNIT) CAPS capsule Take 50,000 Units by mouth once a week. 03/09/23  Yes [provider]    Family History Family History  Problem Relation Age of Onset   Stroke Mother    COPD Maternal Aunt    Hypertension Maternal Uncle    Asthma Maternal Uncle    COPD Maternal Uncle    Diabetes Maternal Grandmother    Asthma Maternal Grandmother    Cancer Maternal Grandfather    Colon cancer Maternal Grandfather     Social History Social History   Tobacco Use   Smoking status: Never   Smokeless tobacco: Never  Vaping Use   Vaping status: Never Used  Substance Use Topics   Alcohol use: Not Currently   Drug use: Never     Allergies   Iodinated contrast media, Hydrocodone, Hydrocodone-acetaminophen, and Propoxyphene   Review of Systems Review of Systems: negative unless otherwise stated in HPI.      Physical Exam Triage Vital Signs ED Triage Vitals  Encounter Vitals Group     BP 03/30/23 1237 115/84     Systolic BP Percentile --      Diastolic BP Percentile --      Pulse Rate 03/30/23 1237 80     Resp 03/30/23 1237 16     Temp 03/30/23 1237 98.9 F (37.2 C)     Temp Source 03/30/23 1237 Oral     SpO2 03/30/23 1237 100 %     Weight  03/30/23 1237 125 lb 12.8 oz (57.1 kg)     Height 03/30/23 1237 5' 4.5 (1.638 m)     Head Circumference --      Peak Flow --      Pain Score 03/30/23 1239 5     Pain Loc --      Pain Education --      Exclude from Growth Chart --    No data found.  Updated Vital Signs BP 115/84 (BP Location: Left Arm)   Pulse 80   Temp 98.9 F (37.2 C) (Oral)   Resp 16   Ht 5' 4.5 (1.638 m)   Wt 57.1 kg   LMP 02/13/2023 (Exact Date)   SpO2 100%   BMI 21.26 kg/m   Visual Acuity Right Eye Distance:   Left Eye Distance:   Bilateral Distance:  Right Eye Near:   Left Eye Near:    Bilateral Near:     Physical Exam GEN:     alert, well appearing female in no distress    HENT:  mucus membranes moist, no nasal discharge EYES:   no scleral injection or discharge NECK:  normal ROM, no JVP RESP:  no increased work of breathing, clear to auscultation bilaterally CVS:   regular rate and rhythm no murmurs, rubs or gallops  CHEST:  sternum TTP without step offs  Skin:   warm and dry, no rash on visible skin    UC Treatments / Results  Labs (all labs ordered are listed, but only abnormal results are displayed) Labs Reviewed - No data to display  EKG   Radiology DG Sternum Result Date: 03/30/2023 CLINICAL DATA:  Sternal pain. EXAM: STERNUM - 2+ VIEW COMPARISON:  None Available. FINDINGS: There is no evidence of fracture or other focal bone lesions. IMPRESSION: Negative. Electronically Signed   By: Norleen DELENA Kil M.D.   On: 03/30/2023 15:30   DG Chest 2 View Result Date: 03/30/2023 CLINICAL DATA:  Chest pressure and shortness of breath for 2 months. EXAM: CHEST - 2 VIEW COMPARISON:  02/24/2023 FINDINGS: The heart size and mediastinal contours are within normal limits. Mild bilateral upper lobe pleural-parenchymal scarring shows no significant change. Both lungs are otherwise clear. The visualized skeletal structures are unremarkable. IMPRESSION: No active cardiopulmonary disease. Electronically  Signed   By: Norleen DELENA Kil M.D.   On: 03/30/2023 15:29       Procedures Procedures (including critical care time)  Medications Ordered in UC Medications - No data to display  Initial Impression / Assessment and Plan / UC Course  I have reviewed the triage vital signs and the nursing notes.  Pertinent labs & imaging results that were available during my care of the patient were reviewed by me and considered in my medical decision making (see chart for details).       Pt is a 53 y.o. female who presents for 2 months of dyspnea that occurs with activity and at rest. Differential diagnosis includes congestive heart failure, arrhythmia, valvular disease, asthma, COPD, emphysema, pleural effusion, anemia, anxiety, panic attacks, metabolic derailments, ACS, pneumothorax, pulmonary embolism     Mikaiya is afebrile here without recent antipyretics. Satting well on room air. Overall pt is well appearing, well hydrated, without respiratory distress. Pulmonary exam is unremarkable.  COVID and influenza panel deferred due to duration of symptoms.  She follows with Dr Saturnino at [facility] pulmonology. She has not picked up her inhaler that was prescribed by Dr Saturnino at [facility] pulmonology on 03/21/23. Reports have PFTs performed.   EKG personally interpreted by me; NSR without acute ST or T wave changes; anterior infarct is not new per EKG from 02/25/23.   Obtained chest and sternum xrays. Chest xray personally reviewed by me without focal pneumonia, rib fracture, sternal fracture, pleural effusion, cardiomegaly or pneumothorax. She does have some reticular pattern to her lungs.  Radiologist impression reviewed an notes mild bilateral upper lobe pleural-parenchymal scarring that is unchanged.  Recommended she follow up with her cardiologist and pulmonologist. She is to pick up her inhaler that was prescribed by Dr Saturnino.   Return and ED precautions given and voiced understanding. Discussed MDM, treatment  plan and plan for follow-up with patient who agrees with plan.     Final Clinical Impressions(s) / UC Diagnoses   Final diagnoses:  SOB (shortness of breath)  Chest pain, unspecified  type     Discharge Instructions      Follow up with your pulmonologist and cardiologist as discussed.  Your chest xray did not show evidence of pneumonia or broken bones though the radiologist has not yet read it. If they find something that I didn't, I will call you.    Go to ED for red flag symptoms, including; worsening chest pain that is accompanied by nausea, vomiting, sweating, pain in your neck or jaw. Severe headaches, vision changes, numbness/weakness in part of the body, lethargy, confusion, terrible vomiting, severe dehydration, breathing difficulty, severe persistent abdominal, feeling faint or passing out, dizziness, etc. You should especially go to the ED for sudden acute worsening of condition if you do not elect to go at this time.       ED Prescriptions   None    PDMP not reviewed this encounter.   Carime Dinkel, DO 04/02/23 1052

## 2023-04-06 ENCOUNTER — Ambulatory Visit
Admission: RE | Admit: 2023-04-06 | Discharge: 2023-04-06 | Disposition: A | Discharge status: Home / Self Care | Payer: Medicaid Other | Source: Ambulatory Visit | Attending: Family Medicine | Admitting: Family Medicine

## 2023-04-06 DIAGNOSIS — M7542 Impingement syndrome of left shoulder: Secondary | ICD-10-CM | POA: Diagnosis present

## 2023-04-06 DIAGNOSIS — M47812 Spondylosis without myelopathy or radiculopathy, cervical region: Secondary | ICD-10-CM

## 2023-04-07 ENCOUNTER — Ambulatory Visit: Payer: Medicaid Other | Admitting: Family Medicine

## 2023-04-07 VITALS — BP 130/74 | HR 84 | Ht 64.5 in | Wt 126.2 lb

## 2023-04-07 DIAGNOSIS — M7542 Impingement syndrome of left shoulder: Secondary | ICD-10-CM

## 2023-04-10 NOTE — Progress Notes (Signed)
     Primary Care / Sports Medicine Office Visit  Patient Information:  Patient ID: Robin Schwartz, female DOB: April 18, 1970 Age: 53 y.o. MRN: [id number]   Robin Schwartz is a pleasant 53 y.o. female presenting with the following:  Chief Complaint  Patient presents with   Shoulder Pain    Left shoulder pain since she did some strength training with the physical therapist last week.     Vitals:   04/07/23 1629  BP: 130/74  Pulse: 84  SpO2: 100%   Vitals:   04/07/23 1629  Weight: 126 lb 3.2 oz (57.2 kg)  Height: 5' 4.5" (1.638 m)   Body mass index is 21.33 kg/m.     Independent interpretation of notes and tests performed by another provider:   None  Procedures performed:   None  Pertinent History, Exam, Impression, and Recommendations:   Problem List Items Addressed This Visit     Rotator cuff impingement syndrome of left shoulder - Primary   History of Present Illness Robin Schwartz is a 53 year old female who presents with left shoulder pain and stiffness.  She has been experiencing persistent left shoulder pain and stiffness. Initially, physical therapy, both individual and formal sessions, provided some relief, allowing her to lay on the shoulder and move it more freely, including placing her hand behind her back. However, after starting strength training on March 31, 2023, she noticed increased pain and inflammation, particularly when laying on the shoulder. The pain radiates to the pectoral area and is constant throughout the day.  She underwent MRIs of the neck spine and left shoulder on April 06, 2023, but the results are not yet available.  She has not taken any specific medication for the shoulder pain recently, although she has used Motrin for migraines. She has previously used Celebrex but experienced a reaction to the 50 mg dose, though she tolerated 100 mg without side effects. She has a full bottle of 100 mg Celebrex at home.  Physical  Exam MUSCULOSKELETAL: Limited abduction to approximately 60 degrees on the left side with pain at end of range. Full painless abduction on the right side. Limited external rotation by approximately 5-10 degrees on the left side. Painless internal rotation with 5/5 strength on both sides. Pain upon empty can test and isolated supraspinatus test on the left side. Tenderness over the biceps tendon and AC joint on the left side.  Assessment and Plan Left Shoulder Pain - focality to RC, supraspinatus Improvement in pain and range of motion with physical therapy, but recent increase in pain and inflammation after strength training. Limited abduction and positive empty can test suggestive of supraspinatus involvement. MRI results pending. -Continue physical therapy, focusing on pain control and gentle range of motion exercises. -Apply Voltaren gel four times a day to affected areas. -Use Celebrex 100 mg once daily as needed for pain if tolerating well, with food. -Apply heat or ice to affected areas as needed for pain relief. -Review MRI results when available.        Orders & Medications Medications: No orders of the defined types were placed in this encounter.  No orders of the defined types were placed in this encounter.    No follow-ups on file.     Jerrol Banana, MD, [facility]   Primary Care Sports Medicine Primary Care and Sports Medicine at [facility]

## 2023-04-10 NOTE — Assessment & Plan Note (Signed)
 History of Present Illness Robin Schwartz is a 53 year old female who presents with left shoulder pain and stiffness.  She has been experiencing persistent left shoulder pain and stiffness. Initially, physical therapy, both individual and formal sessions, provided some relief, allowing her to lay on the shoulder and move it more freely, including placing her hand behind her back. However, after starting strength training on March 31, 2023, she noticed increased pain and inflammation, particularly when laying on the shoulder. The pain radiates to the pectoral area and is constant throughout the day.  She underwent MRIs of the neck spine and left shoulder on April 06, 2023, but the results are not yet available.  She has not taken any specific medication for the shoulder pain recently, although she has used Motrin for migraines. She has previously used Celebrex but experienced a reaction to the 50 mg dose, though she tolerated 100 mg without side effects. She has a full bottle of 100 mg Celebrex at home.  Physical Exam MUSCULOSKELETAL: Limited abduction to approximately 60 degrees on the left side with pain at end of range. Full painless abduction on the right side. Limited external rotation by approximately 5-10 degrees on the left side. Painless internal rotation with 5/5 strength on both sides. Pain upon empty can test and isolated supraspinatus test on the left side. Tenderness over the biceps tendon and AC joint on the left side.  Assessment and Plan Left Shoulder Pain - focality to RC, supraspinatus Improvement in pain and range of motion with physical therapy, but recent increase in pain and inflammation after strength training. Limited abduction and positive empty can test suggestive of supraspinatus involvement. MRI results pending. -Continue physical therapy, focusing on pain control and gentle range of motion exercises. -Apply Voltaren gel four times a day to affected areas. -Use  Celebrex 100 mg once daily as needed for pain if tolerating well, with food. -Apply heat or ice to affected areas as needed for pain relief. -Review MRI results when available.

## 2023-04-14 ENCOUNTER — Encounter: Payer: Self-pay | Admitting: Family Medicine

## 2023-04-15 ENCOUNTER — Encounter: Payer: Self-pay | Admitting: Family Medicine

## 2023-04-15 ENCOUNTER — Other Ambulatory Visit: Payer: Self-pay

## 2023-04-15 DIAGNOSIS — M19012 Primary osteoarthritis, left shoulder: Secondary | ICD-10-CM

## 2023-04-15 NOTE — Progress Notes (Signed)
 Order placed

## 2023-04-21 ENCOUNTER — Encounter: Payer: Self-pay | Admitting: Family Medicine

## 2023-04-26 NOTE — Telephone Encounter (Signed)
 Please review. JM

## 2023-04-28 ENCOUNTER — Emergency Department

## 2023-04-28 ENCOUNTER — Other Ambulatory Visit: Payer: Self-pay

## 2023-04-28 ENCOUNTER — Encounter: Payer: Self-pay | Admitting: Emergency Medicine

## 2023-04-28 ENCOUNTER — Emergency Department
Admission: EM | Admit: 2023-04-28 | Discharge: 2023-04-28 | Disposition: A | Discharge status: Home / Self Care | Attending: Emergency Medicine | Admitting: Emergency Medicine

## 2023-04-28 DIAGNOSIS — R079 Chest pain, unspecified: Secondary | ICD-10-CM | POA: Diagnosis present

## 2023-04-28 DIAGNOSIS — R0602 Shortness of breath: Secondary | ICD-10-CM | POA: Diagnosis not present

## 2023-04-28 DIAGNOSIS — J45909 Unspecified asthma, uncomplicated: Secondary | ICD-10-CM | POA: Insufficient documentation

## 2023-04-28 LAB — CBC
HCT: 37.7 % (ref 36.0–46.0)
Hemoglobin: 12 g/dL (ref 12.0–15.0)
MCH: 26.5 pg (ref 26.0–34.0)
MCHC: 31.8 g/dL (ref 30.0–36.0)
MCV: 83.4 fL (ref 80.0–100.0)
Platelets: 289 10*3/uL (ref 150–400)
RBC: 4.52 MIL/uL (ref 3.87–5.11)
RDW: 14.6 % (ref 11.5–15.5)
WBC: 5.1 10*3/uL (ref 4.0–10.5)
nRBC: 0 % (ref 0.0–0.2)

## 2023-04-28 LAB — RESP PANEL BY RT-PCR (RSV, FLU A&B, COVID)  RVPGX2
Influenza A by PCR: NEGATIVE
Influenza B by PCR: NEGATIVE
Resp Syncytial Virus by PCR: NEGATIVE
SARS Coronavirus 2 by RT PCR: NEGATIVE

## 2023-04-28 LAB — BASIC METABOLIC PANEL
Anion gap: 10 (ref 5–15)
BUN: 12 mg/dL (ref 6–20)
CO2: 24 mmol/L (ref 22–32)
Calcium: 9.2 mg/dL (ref 8.9–10.3)
Chloride: 103 mmol/L (ref 98–111)
Creatinine, Ser: 0.69 mg/dL (ref 0.44–1.00)
GFR, Estimated: >60 mL/min (ref >60)
Glucose, Bld: 124 mg/dL — ABNORMAL HIGH (ref 70–99)
Potassium: 3.5 mmol/L (ref 3.5–5.1)
Sodium: 137 mmol/L (ref 135–145)

## 2023-04-28 LAB — VITAMIN D 25 HYDROXY (VIT D DEFICIENCY, FRACTURES): Vit D, 25-Hydroxy: 71.74 ng/mL (ref 30–100)

## 2023-04-28 LAB — TROPONIN I (HIGH SENSITIVITY)
Troponin I (High Sensitivity): 3 ng/L (ref <18)
Troponin I (High Sensitivity): 3 ng/L (ref <18)

## 2023-04-28 LAB — FERRITIN: Ferritin: 33 ng/mL (ref 11–307)

## 2023-04-28 MED ORDER — IPRATROPIUM-ALBUTEROL 0.5-2.5 (3) MG/3ML IN SOLN
3.0000 mL | Freq: Once | RESPIRATORY_TRACT | Status: AC
Start: 2023-04-28 — End: 2023-04-28
  Administered 2023-04-28: 3 mL via RESPIRATORY_TRACT
  Filled 2023-04-28: qty 3

## 2023-04-28 NOTE — Discharge Instructions (Signed)
 Please follow-up with your primary care provider for ongoing assessment outpatient.  Your laboratory workup was otherwise reassuring with normal EKG and 2 negative troponins with no concerns for damage to your heart.  Chest x-ray shows no sign of pneumonia.

## 2023-04-28 NOTE — ED Triage Notes (Signed)
 Patient to ED via POV for CP and SOB. Ongoing x2 days. Centralized that radiated to left breast and left shoulder yesterday- not today.

## 2023-04-28 NOTE — ED Provider Notes (Signed)
 [facility] Provider Note    Event Date/Time   First MD Initiated Contact with Patient 04/28/23 1007     (approximate)   History   Chest Pain   HPI Robin Schwartz is a 53 y.o. female with history of asthma, GERD presenting today for chest pain.  Patient states yesterday she had an episode of sharp left-sided chest pain which she felt radiate around her back.  She had some mild shortness of breath and lightheadedness at that time.  She states the chest pain is completely gone away but she has mild shortness of breath still present today.  No obvious sick contacts.  Otherwise denies fever, productive cough, nausea, vomiting, abdominal pain, leg pain, leg swelling.     Physical Exam   Triage Vital Signs: ED Triage Vitals  Encounter Vitals Group     BP 04/28/23 0938 113/75     Systolic BP Percentile --      Diastolic BP Percentile --      Pulse Rate 04/28/23 0938 98     Resp 04/28/23 0938 18     Temp 04/28/23 0938 98.8 F (37.1 C)     Temp Source 04/28/23 0938 Oral     SpO2 04/28/23 0938 100 %     Weight 04/28/23 0936 125 lb (56.7 kg)     Height 04/28/23 0936 5' 4.5" (1.638 m)     Head Circumference --      Peak Flow --      Pain Score 04/28/23 0936 3     Pain Loc --      Pain Education --      Exclude from Growth Chart --     Most recent vital signs: Vitals:   04/28/23 0938  BP: 113/75  Pulse: 98  Resp: 18  Temp: 98.8 F (37.1 C)  SpO2: 100%   Physical Exam: I have reviewed the vital signs and nursing notes. General: Awake, alert, no acute distress.  Nontoxic appearing. Head:  Atraumatic, normocephalic.   ENT:  EOM intact, PERRL. Oral mucosa is pink and moist with no lesions. Neck: Neck is supple with full range of motion, No meningeal signs. Cardiovascular:  RRR, No murmurs. Peripheral pulses palpable and equal bilaterally. Respiratory:  Symmetrical chest wall expansion.  No rhonchi, rales, or wheezes.  Good air movement throughout.   No use of accessory muscles.   Musculoskeletal:  No cyanosis or edema. Moving extremities with full ROM Abdomen:  Soft, nontender, nondistended. Neuro:  GCS 15, moving all four extremities, interacting appropriately. Speech clear. Psych:  Calm, appropriate.   Skin:  Warm, dry, no rash.    ED Results / Procedures / Treatments   Labs (all labs ordered are listed, but only abnormal results are displayed) Labs Reviewed  BASIC METABOLIC PANEL - Abnormal; Notable for the following components:      Result Value   Glucose, Bld 124 (*)    All other components within normal limits  RESP PANEL BY RT-PCR (RSV, FLU A&B, COVID)  RVPGX2  CBC  FERRITIN  VITAMIN D 25 HYDROXY (VIT D DEFICIENCY, FRACTURES)  POC URINE PREG, ED  TROPONIN I (HIGH SENSITIVITY)  TROPONIN I (HIGH SENSITIVITY)     EKG My EKG interpretation: Rate of 96, normal sinus rhythm, normal axis, normal intervals.  No acute ST elevations or depressions   RADIOLOGY Independently interpreted chest x-ray with no acute pathology   PROCEDURES:  Critical Care performed: No  Procedures   MEDICATIONS ORDERED IN ED: Medications  ipratropium-albuterol (DUONEB) 0.5-2.5 (3) MG/3ML nebulizer solution 3 mL (3 mLs Nebulization Given 04/28/23 1031)     IMPRESSION / MDM / ASSESSMENT AND PLAN / ED COURSE  I reviewed the triage vital signs and the nursing notes.                              Differential diagnosis includes, but is not limited to, pneumonia, pneumothorax, viral URI, ACS, chest wall pain  Patient's presentation is most consistent with acute complicated illness / injury requiring diagnostic workup.  Patient is a 53 year old female presenting today for 1 episode of chest pain yesterday with continued shortness of breath today.  On exam she is asymptomatic at this time.  Vital signs are stable.  EKG unremarkable.  CBC and BMP unremarkable.  Chest x-ray shows no acute pathology.  Will trial DuoNeb as she was supposedly  recently diagnosed with emphysema but not been using an inhaler.  Although, there is no wheezing on exam.  Troponins negative x 2.  She did feel slightly better after DuoNeb treatment.  Separately she wanted [place] to send off ferritin and vitamin D levels which was fine with me.  She was reassessed and stable for discharge with no acute pathology seen at this time.  Told to follow-up with PCP for ongoing evaluation and given strict return precautions.  The patient is on the cardiac monitor to evaluate for evidence of arrhythmia and/or significant heart rate changes.     FINAL CLINICAL IMPRESSION(S) / ED DIAGNOSES   Final diagnoses:  Chest pain, unspecified type     Rx / DC Orders   ED Discharge Orders     None        Note:  This document was prepared using Dragon voice recognition software and may include unintentional dictation errors.   Janith Lima, MD 04/28/23 [phone]

## 2023-04-28 NOTE — ED Notes (Signed)
 See triage note  Presents with not feeling right today  States she had some chest pain yesterday  States pain was intermittent  Was relief with lying down Denies any n/v/ SOB or fever

## 2023-05-05 ENCOUNTER — Encounter: Payer: Self-pay | Admitting: Family Medicine

## 2023-05-05 ENCOUNTER — Ambulatory Visit: Admitting: Family Medicine

## 2023-05-05 VITALS — BP 98/70 | HR 76 | Ht 64.5 in | Wt 123.0 lb

## 2023-05-05 DIAGNOSIS — M546 Pain in thoracic spine: Secondary | ICD-10-CM | POA: Diagnosis not present

## 2023-05-05 DIAGNOSIS — M47812 Spondylosis without myelopathy or radiculopathy, cervical region: Secondary | ICD-10-CM

## 2023-05-05 DIAGNOSIS — M545 Low back pain, unspecified: Secondary | ICD-10-CM

## 2023-05-05 NOTE — Progress Notes (Signed)
 Primary Care / Sports Medicine Office Visit  Patient Information:  Patient ID: Robin Schwartz, female DOB: 1970-03-18 Age: 53 y.o. MRN: [id number]   Robin Schwartz is a pleasant 53 y.o. female presenting with the following:  Chief Complaint  Patient presents with   Back Pain    Intermittent mid to low back pain x 1 month. The pain is sharp and makes it difficult to sit back applying pressure to the back. Patient has taken motrin but can't recall if it has helped.     Vitals:   05/05/23 1032  BP: 98/70  Pulse: 76  SpO2: 97%   Vitals:   05/05/23 1032  Weight: 123 lb (55.8 kg)  Height: 5' 4.5" (1.638 m)   Body mass index is 20.79 kg/m.     Independent interpretation of notes and tests performed by another provider:   None  Procedures performed:   None  Pertinent History, Exam, Impression, and Recommendations:   Problem List Items Addressed This Visit     Cervical spondylosis   See additional assessment(s) for plan details.      Relevant Orders   Ambulatory referral to Physical Therapy   Thoracolumbar back pain - Primary   History of Present Illness The patient presents with mid to low back pain.  She has been experiencing mid to low back pain for the past month. The pain is sharp, located in the mid back around the shoulder blade region, and is bilateral. It is exacerbated by sitting and applying pressure to the back. The pain can be excruciating and sudden, as experienced while riding in a truck, making it difficult to sit back. There is a sensation of inflammation and swelling in the back muscles, with heaviness particularly when wearing certain clothing, which feels like an irritant. She has been using Motrin for the pain but is unsure of its effectiveness.  She also reports lower back pain accompanied by tingling sensations in the back of both legs, reaching mid-thigh. Additionally, there is a burning sensation in the feet and toes that has been present  for one to two months. An x-ray report from April of last year, through [facility], showed mild disc height loss at L5-S1, likely degenerative.  She reports sensitivity to light touch on the back, with clothing feeling heavy and uncomfortable, and has noticed new dark spots on her back, resembling acne but not red bumps. No tingling in the feet or toes. She also reports experiencing dizziness and lightheadedness, particularly when standing, and has a history of dizzy spells.  Physical Exam PALPATION: Mild tenderness at the superior thoracic spine and lumbar region centrally. Significant tenderness at the rhomboids and levator scapulae bilaterally. SI tenderness left greater than right.  Nontender to light touch. RANGE OF MOTION: Decreased posterior chain flexibility on the left compared to the right. SKIN: Dark spots on the back, no vesicles, bilateral distribution.  Results RADIOLOGY Lumbar spine X-ray: Mild disc height loss at L5-S1, likely degenerative (05/2022)  Assessment and Plan Mid and low back pain with muscle spasm Suspected muscle spasm due to underlying degenerative changes, known at the cervical and lumbar spine.  The symptoms were most likely aggravated by recent activities. Differential diagnosis to include shingles, but bilateral nature and lack of vesicles make it unlikely. - Prescribe baclofen 5 mg, take half a tablet at night (2.5 mg), increase to full tablet if needed.  This medication can be taken nightly as needed.  Discussed side effects at length including dizziness, drowsiness. -  Advise use of humidified heat and/or hot showers. - Recommend Epsom salt baths. - Provide home exercises post-heat application or muscle relaxer use. - Refer to physical therapy for spine, including neck, mid back, and lower back. - Advise contacting our office if no improvement noted after 6 weeks of PT for potential imaging and interventional spine specialist referral.  Lumbar degenerative disc  disease at L5-S1 Previous imaging shows mild disc height loss at L5-S1, likely degenerative. Symptoms possibly related to nerve irritation at this level. - Monitor symptoms, use heat, massage, and exercises. - Consider referral to a spine specialist if no improvement.  Cervical spine wear and tear MRI shows wear and tear at C3-C4, C4-C5, and C5-C6, contributing to neck and upper shoulder symptoms, headaches, and dizziness. - Include cervical spine in physical therapy referral. - Monitor symptoms, consider referral to a spine specialist if no improvement.      Relevant Orders   Ambulatory referral to Physical Therapy   I provided a total time of 41 minutes including both face-to-face and non-face-to-face time on 05/05/2023 inclusive of time utilized for medical chart review, information gathering, care coordination with staff, and documentation completion.   Orders & Medications Medications: No orders of the defined types were placed in this encounter.  Orders Placed This Encounter  Procedures   Ambulatory referral to Physical Therapy     No follow-ups on file.     Jerrol Banana, MD, [facility]   Primary Care Sports Medicine Primary Care and Sports Medicine at [facility]

## 2023-05-05 NOTE — Assessment & Plan Note (Signed)
 History of Present Illness The patient presents with mid to low back pain.  She has been experiencing mid to low back pain for the past month. The pain is sharp, located in the mid back around the shoulder blade region, and is bilateral. It is exacerbated by sitting and applying pressure to the back. The pain can be excruciating and sudden, as experienced while riding in a truck, making it difficult to sit back. There is a sensation of inflammation and swelling in the back muscles, with heaviness particularly when wearing certain clothing, which feels like an irritant. She has been using Motrin for the pain but is unsure of its effectiveness.  She also reports lower back pain accompanied by tingling sensations in the back of both legs, reaching mid-thigh. Additionally, there is a burning sensation in the feet and toes that has been present for one to two months. An x-ray report from April of last year, through [facility], showed mild disc height loss at L5-S1, likely degenerative.  She reports sensitivity to light touch on the back, with clothing feeling heavy and uncomfortable, and has noticed new dark spots on her back, resembling acne but not red bumps. No tingling in the feet or toes. She also reports experiencing dizziness and lightheadedness, particularly when standing, and has a history of dizzy spells.  Physical Exam PALPATION: Mild tenderness at the superior thoracic spine and lumbar region centrally. Significant tenderness at the rhomboids and levator scapulae bilaterally. SI tenderness left greater than right.  Nontender to light touch. RANGE OF MOTION: Decreased posterior chain flexibility on the left compared to the right. SKIN: Dark spots on the back, no vesicles, bilateral distribution.  Results RADIOLOGY Lumbar spine X-ray: Mild disc height loss at L5-S1, likely degenerative (05/2022)  Assessment and Plan Mid and low back pain with muscle spasm Suspected muscle spasm due to underlying  degenerative changes, known at the cervical and lumbar spine.  The symptoms were most likely aggravated by recent activities. Differential diagnosis to include shingles, but bilateral nature and lack of vesicles make it unlikely. - Prescribe baclofen 5 mg, take half a tablet at night (2.5 mg), increase to full tablet if needed.  This medication can be taken nightly as needed.  Discussed side effects at length including dizziness, drowsiness. - Advise use of humidified heat and/or hot showers. - Recommend Epsom salt baths. - Provide home exercises post-heat application or muscle relaxer use. - Refer to physical therapy for spine, including neck, mid back, and lower back. - Advise contacting our office if no improvement noted after 6 weeks of PT for potential imaging and interventional spine specialist referral.  Lumbar degenerative disc disease at L5-S1 Previous imaging shows mild disc height loss at L5-S1, likely degenerative. Symptoms possibly related to nerve irritation at this level. - Monitor symptoms, use heat, massage, and exercises. - Consider referral to a spine specialist if no improvement.  Cervical spine wear and tear MRI shows wear and tear at C3-C4, C4-C5, and C5-C6, contributing to neck and upper shoulder symptoms, headaches, and dizziness. - Include cervical spine in physical therapy referral. - Monitor symptoms, consider referral to a spine specialist if no improvement.

## 2023-05-05 NOTE — Assessment & Plan Note (Signed)
 See additional assessment(s) for plan details.

## 2023-05-05 NOTE — Patient Instructions (Signed)
 Patient Care Plan  Mid and Low Back Pain  1. Take baclofen 2.5 mg at night. Increase to a full tablet (5 mg) if needed. Be aware of possible dizziness or drowsiness. 2. Use humidified heat or take hot showers to relieve muscle spasms. 3. Take Epsom salt baths for additional relief. 4. Perform home exercises after applying heat or using a muscle relaxer. 5. Attend physical therapy sessions for your neck, mid back, and lower back. 6. Contact our office if there is no improvement after six weeks of physical therapy to discuss further imaging or a referral to a spine specialist.  Lumbar Degenerative Disc Disease  1. Monitor your symptoms and use heat, massage, and exercises to manage discomfort. 2. Consider seeing a spine specialist if there's no improvement.  Cervical Spine degenerative changes  1. Include your cervical spine in physical therapy sessions. 2. Monitor symptoms such as neck and upper shoulder pain, headaches, and dizziness. 3. Consider seeing a spine specialist if there is no improvement.  Red Flags to Watch For  - Contact our office if symptoms worsen or if new symptoms develop, such as increased pain, severe dizziness, or changes in sensation.  Please follow these steps and reach out if you have any questions or concerns regarding your treatment plan.

## 2023-05-10 ENCOUNTER — Ambulatory Visit: Payer: Medicaid Other | Admitting: Gastroenterology

## 2023-05-23 NOTE — Progress Notes (Unsigned)
 New patient visit   Patient: Robin Schwartz   DOB: 1970/04/05   53 y.o. Female  MRN: [id number] Visit Date: 05/24/2023  Today's healthcare provider: Ronnald Ramp, MD   No chief complaint on file.  Subjective    Robin Schwartz is a 53 y.o. female who presents today as a new patient to establish care.      Discussed the use of AI scribe software for clinical note transcription with the patient, who gave verbal consent to proceed.  History of Present Illness       Past Medical History:  Diagnosis Date   Anemia    Anxiety    Migraine     Outpatient Medications Prior to Visit  Medication Sig   AJOVY 225 MG/1.5ML SOAJ  (Patient not taking: Reported on 04/07/2023)   budesonide-formoterol (SYMBICORT) 80-4.5 MCG/ACT inhaler Inhale 2 puffs into the lungs 2 (two) times daily. (Patient not taking: Reported on 04/07/2023)   busPIRone (BUSPAR) 5 MG tablet Take 1 tablet (5 mg total) by mouth in the morning. (Patient not taking: Reported on 04/07/2023)   celecoxib (CELEBREX) 100 MG capsule Take 100 mg by mouth 2 (two) times daily. (Patient not taking: Reported on 04/07/2023)   cholecalciferol (VITAMIN D3) 25 MCG (1000 UNIT) tablet Take 1,000 Units by mouth daily.   ibuprofen (ADVIL) 600 MG tablet Take 1 tablet by mouth every 6 (six) hours as needed.   Iron Combinations (CHROMAGEN) capsule Take 1 capsule by mouth daily.   LORazepam (ATIVAN) 1 MG tablet Take 1 mg by mouth every 8 (eight) hours as needed for anxiety.   omeprazole (PRILOSEC) 40 MG capsule Take 1 capsule (40 mg total) by mouth daily before breakfast. (Patient not taking: Reported on 04/07/2023)   Vitamin D, Ergocalciferol, (DRISDOL) 1.25 MG (50000 UNIT) CAPS capsule Take 50,000 Units by mouth once a week.   No facility-administered medications prior to visit.    Past Surgical History:  Procedure Laterality Date   BREAST EXCISIONAL BIOPSY Left 1992   CYSTOSCOPY  2003   LIPOMA EXCISION Left 2007    shoulder   Family Status  Relation Name Status   Mother  Deceased   Father  Deceased   Sister  Alive   Daughter  Alive   Son  Alive   Son  Alive   [employer]  (Not Specified)   Mat Aunt  Alive   Nurse, mental health  (Not Specified)   Mat Uncle  Alive   MGM  Deceased   MGF  Deceased   PGM  Deceased   PGF  Deceased  No partnership data on file   Family History  Problem Relation Age of Onset   Stroke Mother    COPD Maternal Aunt    Hypertension Maternal Uncle    Asthma Maternal Uncle    COPD Maternal Uncle    Diabetes Maternal Grandmother    Asthma Maternal Grandmother    Cancer Maternal Grandfather    Colon cancer Maternal Grandfather    Social History   Socioeconomic History   Marital status: Divorced    Spouse name: Not on file   Number of children: 3   Years of education: 18   Highest education level: Master's degree (e.g., MA, MS, MEng, MEd, MSW, MBA)  Occupational History   Occupation: Unemployed  Tobacco Use   Smoking status: Never   Smokeless tobacco: Never  Vaping Use   Vaping status: Never Used  Substance and Sexual Activity   Alcohol use: Not Currently  Drug use: Never   Sexual activity: Not Currently  Other Topics Concern   Not on file  Social History Narrative   Not on file   Social Drivers of Health   Financial Resource Strain: Medium Risk (04/07/2023)   Overall Financial Resource Strain (CARDIA)    Difficulty of Paying Living Expenses: Somewhat hard  Food Insecurity: No Food Insecurity (04/07/2023)   Hunger Vital Sign    Worried About Running Out of Food in the Last Year: Never true    Ran Out of Food in the Last Year: Never true  Transportation Needs: No Transportation Needs (04/07/2023)   PRAPARE - Administrator, Civil Service (Medical): No    Lack of Transportation (Non-Medical): No  Physical Activity: Unknown (04/07/2023)   Exercise Vital Sign    Days of Exercise per Week: 0 days    Minutes of Exercise per Session: Not on file   Stress: Stress Concern Present (04/07/2023)   [employer] of Occupational Health - Occupational Stress Questionnaire    Feeling of Stress : Very much  Social Connections: Moderately Integrated (04/07/2023)   Social Connection and Isolation Panel [NHANES]    Frequency of Communication with Friends and Family: More than three times a week    Frequency of Social Gatherings with Friends and Family: Once a week    Attends Religious Services: More than 4 times per year    Active Member of [employer] or Organizations: Yes    Attends Engineer, structural: More than 4 times per year    Marital Status: Divorced     Allergies  Allergen Reactions   Iodinated Contrast Media Itching    Ears and throat itching  Pt states throat feels like it is going to close up- tolerates IV contrast with prep   Ears and throat itching   Hydrocodone Other (See Comments)    Mentally distorted per patient   Hydrocodone-Acetaminophen Other (See Comments)    Made me delusional   Propoxyphene Other (See Comments) and Rash    Hallucination  Other reaction(s): Hallucination  Makes her feel delusional  Hallucination    Other reaction(s): Hallucination Makes her feel delusional     There is no immunization history on file for this patient.  Health Maintenance  Topic Date Due   COVID-19 Vaccine (1) Never done   HIV Screening  Never done   Hepatitis C Screening  Never done   DTaP/Tdap/Td (1 - Tdap) Never done   Zoster Vaccines- Shingrix (1 of 2) Never done   MAMMOGRAM  Never done   INFLUENZA VACCINE  Never done   Cervical Cancer Screening (HPV/Pap Cotest)  09/05/2024   Colonoscopy  12/19/2029   HPV VACCINES  Aged Out    Patient Care Team: Satira Anis as PCP - General (Internal Medicine)  Review of Systems  {Insert previous labs (optional):23779} {See past labs  Heme  Chem  Endocrine  Serology  Results Review (optional):1}   Objective    There were no vitals taken  for this visit. {Insert last BP/Wt (optional):23777}{See vitals history (optional):1}    Depression Screen    09/09/2020    3:26 PM 09/01/2020    3:13 PM 08/19/2020   11:40 AM  PHQ 2/9 Scores  PHQ - 2 Score 0    PHQ- 9 Score 3    Exception Documentation  Patient refusal Patient refusal   No results found for any visits on 05/24/23.   Physical Exam ***    Assessment &  Plan      Problem List Items Addressed This Visit   None   Assessment and Plan Assessment & Plan       No follow-ups on file.      Ronnald Ramp, MD  [facility] [phone] (phone) [phone] (fax)  [facility]

## 2023-05-24 ENCOUNTER — Other Ambulatory Visit: Payer: Self-pay

## 2023-05-24 ENCOUNTER — Emergency Department

## 2023-05-24 ENCOUNTER — Encounter: Payer: Self-pay | Admitting: Family Medicine

## 2023-05-24 ENCOUNTER — Ambulatory Visit: Payer: Self-pay

## 2023-05-24 ENCOUNTER — Ambulatory Visit: Payer: Medicaid Other | Admitting: Family Medicine

## 2023-05-24 ENCOUNTER — Emergency Department
Admission: EM | Admit: 2023-05-24 | Discharge: 2023-05-24 | Disposition: A | Discharge status: Home / Self Care | Attending: Emergency Medicine | Admitting: Emergency Medicine

## 2023-05-24 ENCOUNTER — Encounter: Payer: Self-pay | Admitting: Emergency Medicine

## 2023-05-24 VITALS — BP 114/78 | HR 73 | Resp 16 | Ht 64.5 in | Wt 125.5 lb

## 2023-05-24 DIAGNOSIS — G629 Polyneuropathy, unspecified: Secondary | ICD-10-CM | POA: Diagnosis not present

## 2023-05-24 DIAGNOSIS — Z114 Encounter for screening for human immunodeficiency virus [HIV]: Secondary | ICD-10-CM

## 2023-05-24 DIAGNOSIS — R55 Syncope and collapse: Secondary | ICD-10-CM | POA: Diagnosis present

## 2023-05-24 DIAGNOSIS — G500 Trigeminal neuralgia: Secondary | ICD-10-CM

## 2023-05-24 DIAGNOSIS — R42 Dizziness and giddiness: Secondary | ICD-10-CM

## 2023-05-24 DIAGNOSIS — Z1159 Encounter for screening for other viral diseases: Secondary | ICD-10-CM

## 2023-05-24 DIAGNOSIS — R0789 Other chest pain: Secondary | ICD-10-CM | POA: Diagnosis not present

## 2023-05-24 DIAGNOSIS — R202 Paresthesia of skin: Secondary | ICD-10-CM

## 2023-05-24 DIAGNOSIS — R634 Abnormal weight loss: Secondary | ICD-10-CM

## 2023-05-24 DIAGNOSIS — D509 Iron deficiency anemia, unspecified: Secondary | ICD-10-CM

## 2023-05-24 DIAGNOSIS — N393 Stress incontinence (female) (male): Secondary | ICD-10-CM

## 2023-05-24 DIAGNOSIS — R200 Anesthesia of skin: Secondary | ICD-10-CM

## 2023-05-24 DIAGNOSIS — E559 Vitamin D deficiency, unspecified: Secondary | ICD-10-CM

## 2023-05-24 DIAGNOSIS — K581 Irritable bowel syndrome with constipation: Secondary | ICD-10-CM | POA: Diagnosis not present

## 2023-05-24 LAB — CBC
HCT: 39 % (ref 36.0–46.0)
Hemoglobin: 12.2 g/dL (ref 12.0–15.0)
MCH: 26.2 pg (ref 26.0–34.0)
MCHC: 31.3 g/dL (ref 30.0–36.0)
MCV: 83.7 fL (ref 80.0–100.0)
Platelets: 294 10*3/uL (ref 150–400)
RBC: 4.66 MIL/uL (ref 3.87–5.11)
RDW: 13.9 % (ref 11.5–15.5)
WBC: 5 10*3/uL (ref 4.0–10.5)
nRBC: 0 % (ref 0.0–0.2)

## 2023-05-24 LAB — BASIC METABOLIC PANEL WITH GFR
Anion gap: 7 (ref 5–15)
BUN: 12 mg/dL (ref 6–20)
CO2: 24 mmol/L (ref 22–32)
Calcium: 8.6 mg/dL — ABNORMAL LOW (ref 8.9–10.3)
Chloride: 108 mmol/L (ref 98–111)
Creatinine, Ser: 0.48 mg/dL (ref 0.44–1.00)
GFR, Estimated: >60 mL/min
Glucose, Bld: 103 mg/dL — ABNORMAL HIGH (ref 70–99)
Potassium: 3.4 mmol/L — ABNORMAL LOW (ref 3.5–5.1)
Sodium: 139 mmol/L (ref 135–145)

## 2023-05-24 LAB — TROPONIN I (HIGH SENSITIVITY): Troponin I (High Sensitivity): 3 ng/L

## 2023-05-24 LAB — HEMOGLOBIN A1C
Hgb A1c MFr Bld: 4.8 % (ref 4.8–5.6)
Mean Plasma Glucose: 91.06 mg/dL

## 2023-05-24 NOTE — ED Triage Notes (Signed)
 Patient to ED via POV for near syncope. PT reports getting chest pressure in the ambulance en route. Also having bilateral arm weakness. Denies cardiac hx  Given 1 spray of nitroglycerin and 324 ASA by EMS.

## 2023-05-24 NOTE — Discharge Instructions (Signed)
 Please drink plenty of fluids.  Please follow-up with your doctor regarding today's emergency department visit.  Return to the emergency department for any chest pain, trouble breathing or any further events of passing out or feeling like you are going to pass out.

## 2023-05-24 NOTE — ED Triage Notes (Signed)
 Triage being delayed due to patient in restroom

## 2023-05-24 NOTE — Telephone Encounter (Signed)
 Per agent report, patient was at church having fainting spells with dizziness and shortness of breath. Paramedics were called to the scene prior to calling office to speak with nurse triage. Patient states that she no longer needed to speak with nurse triage since paramedics were on scene.   Copied from CRM [id number]. Topic: Clinical - Red Word Triage >> May 24, 2023  1:24 PM Clayton Bibles wrote: Red Word that prompted transfer to Nurse Triage:  Dizzy, fainting, shortness of breath, weakness

## 2023-05-24 NOTE — ED Provider Notes (Signed)
 [facility] Provider Note    Event Date/Time   First MD Initiated Contact with Patient 05/24/23 1653     (approximate)  History   Chief Complaint: Chest Pain  HPI  Robin Schwartz is a 53 y.o. female with a past medical history anxiety, anemia, presents to the emergency department for a near syncopal episode.  According to the patient she had a new PCP visit this morning so she was fasting for lab work.  States they had a hard time getting her labs and shortly after leaving the office she began feeling lightheaded and dizzy.  Patient tried to eat something and felt somewhat better, went to church and began feeling lightheaded again.  Did not pass out but felt like she could pass out.  They called EMS to bring the patient to the emergency department for evaluation.  Patient states she was having some chest tightness at times as well.  Has not had any since.  Physical Exam   Triage Vital Signs: ED Triage Vitals  Encounter Vitals Group     BP 05/24/23 1437 131/74     Systolic BP Percentile --      Diastolic BP Percentile --      Pulse Rate 05/24/23 1437 75     Resp 05/24/23 1437 17     Temp 05/24/23 1437 98.3 F (36.8 C)     Temp Source 05/24/23 1437 Oral     SpO2 05/24/23 1437 98 %     Weight 05/24/23 1438 125 lb (56.7 kg)     Height 05/24/23 1438 5' 4.5" (1.638 m)     Head Circumference --      Peak Flow --      Pain Score 05/24/23 1438 7     Pain Loc --      Pain Education --      Exclude from Growth Chart --     Most recent vital signs: Vitals:   05/24/23 1437  BP: 131/74  Pulse: 75  Resp: 17  Temp: 98.3 F (36.8 C)  SpO2: 98%    General: Awake, no distress.  Anxious appearing. CV:  Good peripheral perfusion.  Regular rate and rhythm  Resp:  Normal effort.  Equal breath sounds bilaterally.  Abd:  No distention.  Soft, nontender.  No rebound or guarding.  ED Results / Procedures / Treatments   EKG  EKG viewed and interpreted by  myself shows a normal sinus rhythm at 69 bpm with a narrow QRS, normal axis, normal intervals, no concerning ST changes.  Reassuring EKG.  RADIOLOGY  I have reviewed and interpreted chest x-ray images.  No consolidation seen on my evaluation. Radiology is read the x-ray as negative.   MEDICATIONS ORDERED IN ED: Medications - No data to display   IMPRESSION / MDM / ASSESSMENT AND PLAN / ED COURSE  I reviewed the triage vital signs and the nursing notes.  Patient's presentation is most consistent with acute presentation with potential threat to life or bodily function.  Patient presents emergency department for a near syncopal episode.  It occurred shortly after getting lab work performed and the patient had fasted and had not drinking anything this morning.  I suspect it could be related to transient hypoglycemia, dehydration or possible vagal event.  Patient's workup in the emergency department is reassuring but the patient did eat before she came.  Her chemistry shows no significant finding, CBC shows no concerning finding and troponin is reassuringly negative.  Patient  is anxious, but reassured by the negative workup.  Given the patient's reassuring physical exam reassuring vital signs reassuring lab work I believe the patient is safe for discharge home with outpatient follow-up.  Patient is agreeable to plan of care.  FINAL CLINICAL IMPRESSION(S) / ED DIAGNOSES   Near syncope  Note:  This document was prepared using Dragon voice recognition software and may include unintentional dictation errors.   Minna Antis, MD 05/24/23 [phone]

## 2023-05-24 NOTE — Telephone Encounter (Signed)
 Reviewed, agree with ED evaluation.please schedule pt for ED follow up as schedule permits

## 2023-05-24 NOTE — ED Triage Notes (Signed)
 Arrived by [facility] From church. Had near syncopal episode. Recently seen at PCP and had blood drawn. Felt lightheaded and had sob when she got to the church. Chest discomfort on way to ER.  EMS administered: 324 aspirin 1 spray nitroglycerin  EMS vitals: 88HR 150/100b/p 99% RA

## 2023-05-25 ENCOUNTER — Telehealth: Payer: Self-pay | Admitting: Family Medicine

## 2023-05-25 NOTE — Telephone Encounter (Signed)
 Appt has been scheduled for the 1st available at 4/16 @ 2pm

## 2023-05-26 LAB — HEMOGLOBIN A1C

## 2023-05-26 LAB — CBC

## 2023-05-27 LAB — CMP14+EGFR
ALT: 9 IU/L (ref 0–32)
AST: 17 IU/L (ref 0–40)
Albumin: 4.8 g/dL (ref 3.8–4.9)
Alkaline Phosphatase: 74 IU/L (ref 44–121)
BUN/Creatinine Ratio: 18 (ref 9–23)
BUN: 12 mg/dL (ref 6–24)
Bilirubin Total: 0.3 mg/dL (ref 0.0–1.2)
CO2: 24 mmol/L (ref 20–29)
Calcium: 9.7 mg/dL (ref 8.7–10.2)
Chloride: 101 mmol/L (ref 96–106)
Creatinine, Ser: 0.67 mg/dL (ref 0.57–1.00)
Globulin, Total: 2.7 g/dL (ref 1.5–4.5)
Glucose: 84 mg/dL (ref 70–99)
Potassium: 4.1 mmol/L (ref 3.5–5.2)
Sodium: 140 mmol/L (ref 134–144)
Total Protein: 7.5 g/dL (ref 6.0–8.5)
eGFR: 106 mL/min/{1.73_m2} (ref >59)

## 2023-05-27 LAB — IRON,TIBC AND FERRITIN PANEL
Ferritin: 24 ng/mL (ref 15–150)
Iron Saturation: 16 % (ref 15–55)
Iron: 58 ug/dL (ref 27–159)
Total Iron Binding Capacity: 367 ug/dL (ref 250–450)
UIBC: 309 ug/dL (ref 131–425)

## 2023-05-27 LAB — CBC

## 2023-05-27 LAB — LIPID PANEL
Chol/HDL Ratio: 2.3 ratio (ref 0.0–4.4)
Cholesterol, Total: 187 mg/dL (ref 100–199)
HDL: 81 mg/dL (ref >39)
LDL Chol Calc (NIH): 94 mg/dL (ref 0–99)
Triglycerides: 63 mg/dL (ref 0–149)
VLDL Cholesterol Cal: 12 mg/dL (ref 5–40)

## 2023-05-27 LAB — HEPATITIS C ANTIBODY: Hep C Virus Ab: NONREACTIVE

## 2023-05-27 LAB — TSH+T4F+T3FREE
Free T4: 1.2 ng/dL (ref 0.82–1.77)
T3, Free: 2.8 pg/mL (ref 2.0–4.4)
TSH: 1.76 u[IU]/mL (ref 0.450–4.500)

## 2023-05-27 LAB — VITAMIN D 25 HYDROXY (VIT D DEFICIENCY, FRACTURES): Vit D, 25-Hydroxy: 48.3 ng/mL (ref 30.0–100.0)

## 2023-05-27 LAB — HIV ANTIBODY (ROUTINE TESTING W REFLEX)

## 2023-05-27 LAB — VITAMIN B12: Vitamin B-12: 506 pg/mL (ref 232–1245)

## 2023-05-30 ENCOUNTER — Ambulatory Visit: Attending: Family Medicine | Admitting: Physical Therapy

## 2023-05-30 ENCOUNTER — Encounter: Payer: Self-pay | Admitting: Family Medicine

## 2023-05-30 ENCOUNTER — Encounter: Payer: Self-pay | Admitting: Physical Therapy

## 2023-05-30 DIAGNOSIS — M546 Pain in thoracic spine: Secondary | ICD-10-CM | POA: Diagnosis not present

## 2023-05-30 DIAGNOSIS — M542 Cervicalgia: Secondary | ICD-10-CM | POA: Insufficient documentation

## 2023-05-30 DIAGNOSIS — G8929 Other chronic pain: Secondary | ICD-10-CM | POA: Insufficient documentation

## 2023-05-30 DIAGNOSIS — M25512 Pain in left shoulder: Secondary | ICD-10-CM | POA: Insufficient documentation

## 2023-05-30 DIAGNOSIS — M19012 Primary osteoarthritis, left shoulder: Secondary | ICD-10-CM | POA: Diagnosis not present

## 2023-05-30 DIAGNOSIS — M47812 Spondylosis without myelopathy or radiculopathy, cervical region: Secondary | ICD-10-CM | POA: Insufficient documentation

## 2023-05-30 DIAGNOSIS — M545 Low back pain, unspecified: Secondary | ICD-10-CM | POA: Diagnosis not present

## 2023-05-30 DIAGNOSIS — M5459 Other low back pain: Secondary | ICD-10-CM | POA: Insufficient documentation

## 2023-05-30 DIAGNOSIS — M25612 Stiffness of left shoulder, not elsewhere classified: Secondary | ICD-10-CM | POA: Insufficient documentation

## 2023-05-30 DIAGNOSIS — M6281 Muscle weakness (generalized): Secondary | ICD-10-CM | POA: Insufficient documentation

## 2023-06-01 ENCOUNTER — Ambulatory Visit: Admitting: Physical Therapy

## 2023-06-01 DIAGNOSIS — M25512 Pain in left shoulder: Secondary | ICD-10-CM | POA: Diagnosis not present

## 2023-06-01 DIAGNOSIS — M25612 Stiffness of left shoulder, not elsewhere classified: Secondary | ICD-10-CM

## 2023-06-01 DIAGNOSIS — M5459 Other low back pain: Secondary | ICD-10-CM

## 2023-06-01 DIAGNOSIS — G8929 Other chronic pain: Secondary | ICD-10-CM

## 2023-06-01 DIAGNOSIS — M542 Cervicalgia: Secondary | ICD-10-CM

## 2023-06-01 DIAGNOSIS — M6281 Muscle weakness (generalized): Secondary | ICD-10-CM

## 2023-06-06 ENCOUNTER — Ambulatory Visit: Admitting: Physical Therapy

## 2023-06-06 DIAGNOSIS — M6281 Muscle weakness (generalized): Secondary | ICD-10-CM

## 2023-06-06 DIAGNOSIS — M5459 Other low back pain: Secondary | ICD-10-CM

## 2023-06-06 DIAGNOSIS — G8929 Other chronic pain: Secondary | ICD-10-CM

## 2023-06-06 DIAGNOSIS — M542 Cervicalgia: Secondary | ICD-10-CM

## 2023-06-06 DIAGNOSIS — M25612 Stiffness of left shoulder, not elsewhere classified: Secondary | ICD-10-CM

## 2023-06-06 DIAGNOSIS — M25512 Pain in left shoulder: Secondary | ICD-10-CM | POA: Diagnosis not present

## 2023-06-07 ENCOUNTER — Encounter: Payer: Self-pay | Admitting: Family Medicine

## 2023-06-07 NOTE — Telephone Encounter (Signed)
 Called and was able to inform the pt of the set of labs she was referring to, per Dr R the lab was collected on 4/1 so no other labs are needed at the time. She verbally stated she understood

## 2023-06-07 NOTE — Telephone Encounter (Signed)
Please see the message below and advise.

## 2023-06-08 ENCOUNTER — Inpatient Hospital Stay: Admitting: Family Medicine

## 2023-06-08 ENCOUNTER — Ambulatory Visit: Admitting: Physical Therapy

## 2023-06-08 ENCOUNTER — Ambulatory Visit
Admission: RE | Admit: 2023-06-08 | Discharge: 2023-06-08 | Disposition: A | Discharge status: Home / Self Care | Source: Ambulatory Visit | Attending: Family Medicine | Admitting: Family Medicine

## 2023-06-08 ENCOUNTER — Encounter: Payer: Self-pay | Admitting: Family Medicine

## 2023-06-08 ENCOUNTER — Ambulatory Visit
Admission: RE | Admit: 2023-06-08 | Discharge: 2023-06-08 | Disposition: A | Discharge status: Home / Self Care | Attending: Family Medicine | Admitting: Family Medicine

## 2023-06-08 ENCOUNTER — Ambulatory Visit: Admitting: Family Medicine

## 2023-06-08 VITALS — BP 111/70 | HR 78 | Temp 97.7°F | Resp 18 | Ht 64.5 in | Wt 128.0 lb

## 2023-06-08 DIAGNOSIS — M542 Cervicalgia: Secondary | ICD-10-CM

## 2023-06-08 DIAGNOSIS — R42 Dizziness and giddiness: Secondary | ICD-10-CM | POA: Diagnosis not present

## 2023-06-08 DIAGNOSIS — Z09 Encounter for follow-up examination after completed treatment for conditions other than malignant neoplasm: Secondary | ICD-10-CM | POA: Diagnosis not present

## 2023-06-08 DIAGNOSIS — K581 Irritable bowel syndrome with constipation: Secondary | ICD-10-CM | POA: Diagnosis not present

## 2023-06-08 DIAGNOSIS — M25512 Pain in left shoulder: Secondary | ICD-10-CM | POA: Diagnosis not present

## 2023-06-08 DIAGNOSIS — R0781 Pleurodynia: Secondary | ICD-10-CM | POA: Insufficient documentation

## 2023-06-08 DIAGNOSIS — R55 Syncope and collapse: Secondary | ICD-10-CM

## 2023-06-08 DIAGNOSIS — G8929 Other chronic pain: Secondary | ICD-10-CM

## 2023-06-08 DIAGNOSIS — R110 Nausea: Secondary | ICD-10-CM

## 2023-06-08 DIAGNOSIS — K921 Melena: Secondary | ICD-10-CM | POA: Diagnosis not present

## 2023-06-08 DIAGNOSIS — M25612 Stiffness of left shoulder, not elsewhere classified: Secondary | ICD-10-CM

## 2023-06-08 DIAGNOSIS — M6281 Muscle weakness (generalized): Secondary | ICD-10-CM

## 2023-06-08 DIAGNOSIS — M5459 Other low back pain: Secondary | ICD-10-CM

## 2023-06-08 MED ORDER — ONDANSETRON 4 MG PO TBDP: 4.0000 mg | ORAL_TABLET | Freq: Three times a day (TID) | ORAL | 0 refills | Status: AC | PRN

## 2023-06-08 NOTE — Progress Notes (Signed)
 Established patient visit   Patient: Robin Schwartz   DOB: Sep 17, 1970   53 y.o. Female  MRN: [id number] Visit Date: 06/08/2023  Today's healthcare provider: Ronnald Ramp, MD   Chief Complaint  Patient presents with   Follow-up    On hospital visit and blood work   abnormal stool    Pt reports having 3x poops since Monday with red specs, abdominal pain, with nausea that comes and goes   Abdominal Pain    That comes and goes and also episodes of extreme pain with exercise in lower abdomen    Chest Pain    Pain in left rib when laying on left side, onset 3-4 days   Subjective     HPI     Follow-up    Additional comments: On hospital visit and blood work        abnormal stool    Additional comments: Pt reports having 3x poops since Monday with red specs, abdominal pain, with nausea that comes and goes        Abdominal Pain    Additional comments: That comes and goes and also episodes of extreme pain with exercise in lower abdomen         Chest Pain    Additional comments: Pain in left rib when laying on left side, onset 3-4 days      Last edited by Allayne Stack on 06/08/2023  2:34 PM.       Discussed the use of AI scribe software for clinical note transcription with the patient, who gave verbal consent to proceed.  History of Present Illness Robin Schwartz is a 53 year old female who presents with left rib pain and gastrointestinal symptoms.  She has been experiencing left rib pain for three to four days, described as 'painful, painful' when lying on her left side. The pain is primarily present when she lies on it, prompting her to lie on her right side to avoid discomfort. No recent falls or injuries that could have caused a fracture are reported.  She experiences intermittent abdominal pain and has had three bowel movements with blood this week. The blood is mixed with the stool, which is unusual for her as she typically does not have soft  stools unless experiencing anxiety. She has been drinking a lot of water and first noticed the blood on Monday, with subsequent bowel movements also containing blood. She has a history of IBS and anemia, for which she has received iron infusions in the past.  She has been experiencing nausea and stomach pain, which are new symptoms in the past month. She associates nausea with her migraines but notes it is a new symptom. She has not taken medications like Zofran for nausea but has used omeprazole for reflux symptoms. She describes a burning sensation in her esophagus when eating certain foods, which she attributes to reflux.  She was evaluated in the hospital on May 24, 2023, for presyncope episodes and chest tightness. Her A1c was 4.8, indicating no diabetes. Her chest x-ray showed no active cardiac issues, and her lab results included a slightly low potassium level of 3.4. She has a history of low potassium and has been using over-the-counter supplements to manage it.  She is currently taking Symbicort 80-4.5 mcg, an iron combination capsule, and vitamin D. She uses ibuprofen sporadically for pain but is cautious due to potential gastrointestinal side effects.  She has a history of emphysema, although there is some confusion regarding this  diagnosis as different doctors have provided conflicting information. She has been told by a pulmonologist that she does not have emphysema, but an urgent care doctor previously mentioned it.     Past Medical History:  Diagnosis Date   Allergy    Anemia    Anxiety    Arthritis    Emphysema of lung (HCC)    Migraine    Neuromuscular disorder (HCC)     Medications: Outpatient Medications Prior to Visit  Medication Sig   budesonide-formoterol (SYMBICORT) 80-4.5 MCG/ACT inhaler Inhale 2 puffs into the lungs 2 (two) times daily.   cholecalciferol (VITAMIN D3) 25 MCG (1000 UNIT) tablet Take 1,000 Units by mouth daily.   ibuprofen (ADVIL) 600 MG tablet  Take 1 tablet by mouth every 6 (six) hours as needed.   Iron Combinations (CHROMAGEN) capsule Take 1 capsule by mouth daily.   LORazepam (ATIVAN) 1 MG tablet Take 1 mg by mouth every 8 (eight) hours as needed for anxiety.   Vitamin D, Ergocalciferol, (DRISDOL) 1.25 MG (50000 UNIT) CAPS capsule Take 50,000 Units by mouth once a week.   No facility-administered medications prior to visit.    Review of Systems  Last CBC Lab Results  Component Value Date   WBC 5.0 05/24/2023   HGB 12.2 05/24/2023   HCT 39.0 05/24/2023   MCV 83.7 05/24/2023   MCH 26.2 05/24/2023   RDW 13.9 05/24/2023   PLT 294 05/24/2023   Last metabolic panel Lab Results  Component Value Date   GLUCOSE 103 (H) 05/24/2023   NA 139 05/24/2023   K 3.4 (L) 05/24/2023   CL 108 05/24/2023   CO2 24 05/24/2023   BUN 12 05/24/2023   CREATININE 0.48 05/24/2023   EGFR 106 05/24/2023   CALCIUM 8.6 (L) 05/24/2023   PROT 7.5 05/24/2023   ALBUMIN 4.8 05/24/2023   LABGLOB 2.7 05/24/2023   AGRATIO 1.7 08/19/2020   BILITOT 0.3 05/24/2023   ALKPHOS 74 05/24/2023   AST 17 05/24/2023   ALT 9 05/24/2023   ANIONGAP 7 05/24/2023   Last lipids Lab Results  Component Value Date   CHOL 187 05/24/2023   HDL 81 05/24/2023   LDLCALC 94 05/24/2023   TRIG 63 05/24/2023   CHOLHDL 2.3 05/24/2023   Last hemoglobin A1c Lab Results  Component Value Date   HGBA1C 4.8 05/24/2023   Last thyroid functions Lab Results  Component Value Date   TSH 1.760 05/24/2023   T3TOTAL 92 03/22/2023   Last vitamin D Lab Results  Component Value Date   VD25OH 48.3 05/24/2023   Last vitamin B12 and Folate Lab Results  Component Value Date   VITAMINB12 506 05/24/2023   FOLATE 11.0 08/19/2020        Objective    BP 111/70 (BP Location: Right Arm, Patient Position: Sitting, Cuff Size: Normal)   Pulse 78   Temp 97.7 F (36.5 C) (Oral)   Resp 18   Ht 5' 4.5" (1.638 m)   Wt 128 lb (58.1 kg)   LMP 04/24/2023   SpO2 100%   BMI 21.63  kg/m   BP Readings from Last 3 Encounters:  06/08/23 111/70  05/24/23 (!) 146/82  05/24/23 114/78   Wt Readings from Last 3 Encounters:  06/08/23 128 lb (58.1 kg)  05/24/23 125 lb (56.7 kg)  05/24/23 125 lb 8 oz (56.9 kg)        Physical Exam  Physical Exam ABDOMEN: Abdomen soft, not distended. Bowel sounds quiet. Tenderness on left side of rib  without erythema/no ecchymosis of the skin, there is no swelling noted or deformity, no signs of trauma to area of tenderness    No results found for any visits on 06/08/23.  Assessment & Plan     Problem List Items Addressed This Visit       Cardiovascular and Mediastinum   Postural dizziness with presyncope     Digestive   Irritable bowel syndrome with constipation   Relevant Medications   ondansetron (ZOFRAN-ODT) 4 MG disintegrating tablet     Other   Hematochezia - Primary   Relevant Orders   Ambulatory referral to Gastroenterology   Other Visit Diagnoses       Hospital discharge follow-up         Nausea       Relevant Medications   ondansetron (ZOFRAN-ODT) 4 MG disintegrating tablet     Rib pain on left side       Relevant Orders   DG Ribs Unilateral W/Chest Left        Assessment & Plan Hematochezia acute Intermittent hematochezia with blood mixed in stool over the past week. Differential diagnosis includes inflammatory bowel disease (e.g., Crohn's disease, ulcerative colitis), gastrointestinal ulcers, or other sources of gastrointestinal bleeding. Previous colonoscopy was over three years ago, and symptoms warrant further evaluation. Discussed colonoscopy or EGD, considering symptoms and anemia. - Refer to gastroenterology for evaluation and possible colonoscopy or EGD - Advise to hold ibuprofen due to potential contribution to gastrointestinal ulcers  Abdominal Pain Acutely worsened, chronic problem Intermittent abdominal pain with nausea and recent onset of esophageal burning. Symptoms suggest possible  gastrointestinal irritation or inflammation. Differential includes reflux, inflammatory bowel disease, or ulcers. Discussed omeprazole for esophageal burning and reflux. She prefers to manage nausea without medication but is open to Zofran if needed. - Consider omeprazole for esophageal burning and reflux - Offer Zofran for nausea, though she prefers to let symptoms pass  Left Rib Pain Acute  Left rib pain, primarily when lying on the left side, persisting for three to four days. Likely costochondritis. No recent trauma or falls reported. Discussed NSAIDs for inflammation but advised against due to potential GI issues. - Recommend use of heating pad and massage for symptomatic relief - Order chest x-ray to rule out fractures or other abnormalities  Hypokalemia Slightly low potassium level at 3.4 mEq/L with associated muscle cramps. Discussed over-the-counter potassium supplements to address deficiency. - Advise taking over-the-counter potassium supplements for three to four days  Follow-up Multiple follow-up appointments scheduled with specialists for ongoing issues. Discussed importance of follow-ups to address symptoms and ensure comprehensive care. - Follow up with rheumatology as scheduled - Follow up with neurology for tremors on April 24 - Follow up with cardiology for chest pain and tightness - Continue with physical therapy - Schedule follow-up appointment with primary care in June     Return in about 2 months (around 08/08/2023).         Mimi Alt, MD  [facility] [phone] (phone) [phone] (fax)  [facility]

## 2023-06-11 NOTE — Therapy (Signed)
 OUTPATIENT PHYSICAL THERAPY SHOULDER/ BACK EVALUATION  Patient Name: Robin Schwartz MRN: [id number] DOB:1970-11-03, 53 y.o., female Today's Date: 05/30/2023  END OF SESSION:  PT End of Session - 06/11/23 1903     Visit Number 1    Number of Visits 16    Date for PT Re-Evaluation 07/25/23    PT Start Time 0820    PT Stop Time 0904    PT Time Calculation (min) 44 min             Past Medical History:  Diagnosis Date   Allergy    Anemia    Anxiety    Arthritis    Emphysema of lung (HCC)    Migraine    Neuromuscular disorder (HCC)    Past Surgical History:  Procedure Laterality Date   BREAST EXCISIONAL BIOPSY Left 1992   BREAST SURGERY  10/23/1989   Remove benign cyst   CYSTOSCOPY  2003   LIPOMA EXCISION Left 2007   shoulder   Patient Active Problem List   Diagnosis Date Noted   Hematochezia 06/08/2023   Biceps tendinitis, left [M75.22] 03/15/2023   Prolonged menstruation 03/09/2023   Trigger thumb, right thumb 02/17/2023   Lightheadedness 02/11/2023   Rotator cuff impingement syndrome of left shoulder 09/10/2022   Greater trochanteric pain syndrome of left lower extremity 09/10/2022   Neuropathy 04/12/2022   Angioedema 04/12/2022   Migraine headache 04/12/2022   Numbness and tingling of both lower extremities 07/17/2021   Postural dizziness with presyncope 07/17/2021   Weakness of both lower extremities 05/15/2021   New daily persistent headache 05/15/2021   Trigeminal neuralgia 03/11/2021   Nocturnal foot cramps 02/04/2021   Panic disorder 10/20/2020   BMI 20.0-20.9, adult 10/02/2020   Chronic fatigue 10/02/2020   History of COVID-19 09/11/2020   Bilateral primary osteoarthritis of knee 09/09/2020   Weight loss, unintentional 09/09/2020   Cervical spondylosis 09/01/2020   Anemia 08/19/2020   Cervical paraspinous muscle spasm 08/19/2020   Thickened endometrium 08/14/2020   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   Iron deficiency anemia 05/02/2020   Heart  palpitations 03/06/2020   Hypersomnia 06/11/2019   Seasonal allergies 06/07/2019   Dysmenorrhea 02/21/2019   MEE (middle ear effusion), bilateral 02/21/2019   Cervical stenosis of spine 06/08/2018   Hand joint pain 06/08/2018   Positive antinuclear antibody 06/08/2018   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   Chronic abdominal pain 10/10/2014   Headache disorder 12/12/2013   Patellofemoral dysfunction 11/15/2013   Stress incontinence 08/10/2013   Thoracolumbar back pain 08/09/2013   Bursitis of both hips 08/09/2013   Other bursitis of hip, unspecified hip 08/09/2013   Personal history of other endocrine, nutritional and metabolic disease 78/46/9629   Iliotibial band syndrome 05/31/2013   Irritable bowel syndrome with constipation 04/27/2013   Gastroesophageal reflux disease 04/04/2013   Eustachian tube dysfunction 11/29/2012   Lesion of sciatic nerve 09/27/2012   Piriformis syndrome 09/27/2012   Generalized anxiety disorder 03/28/2012   Skin sensation disturbance 06/01/2011   Disequilibrium 06/01/2011    PCP: Mimi Alt, MD  REFERRING PROVIDER: Ma Saupe, MD  REFERRING DIAG:  Diagnosis  M54.50,M54.6 (ICD-10-CM) - Thoracolumbar back pain  M47.812 (ICD-10-CM) - Cervical spondylosis  M19.012 (ICD-10-CM) - Osteoarthritis of left shoulder, unspecified osteoarthritis type   THERAPY DIAG:  Chronic left shoulder pain  Shoulder joint stiffness, left  Muscle weakness (generalized)  Other low back pain  Neck pain  Rationale for Evaluation and Treatment: Rehabilitation  ONSET DATE: Chronic  SUBJECTIVE:  SUBJECTIVE STATEMENT: Pt. Known to PT clinic.  Pt. Referred to PT with c/o L shoulder pain/limitations and chronic low back/ neck pain/ headaches.  Pts. Primary goal to is to  decrease pain symptoms.  Pt. States her migraines and dizziness limits ability to work.  Pt. States she was pulling on washing machine and injured L shoulder.  Pt. Had cortisone injection with no benefit and PT in [place] for strengthening with marked increase in pain.  See MRI results.   Hand dominance: Right  PERTINENT HISTORY: See MD notes.    PAIN:  Are you having pain? Yes: NPRS scale: 5/10 L shoulder, 6/10 low back  Pain location: sh./neck/back Pain description: persistent/ sharp pain in L shoulder with mvmt.   Aggravating factors: movement Relieving factors: rest  PRECAUTIONS: None  RED FLAGS: None   WEIGHT BEARING RESTRICTIONS: No  FALLS:  Has patient fallen in last 6 months? No  LIVING ENVIRONMENT: Lives with: lives alone Lives in: House/apartment Has following equipment at home: None  OCCUPATION: Real Estate  PLOF: Independent  PATIENT GOALS: Increase L shoulder ROM/ strength.  Decrease pain  NEXT MD VISIT:   OBJECTIVE:  Note: Objective measures were completed at Evaluation unless otherwise noted.  DIAGNOSTIC FINDINGS:  FINDINGS: Rotator cuff: Mild supraspinatus tendinosis. Infraspinatus tendon is intact. Teres minor tendon is intact. Subscapularis tendon is intact.   Muscles: No muscle atrophy or edema. No intramuscular fluid collection or hematoma.   Biceps Long Head: Intraarticular and extraarticular portions of the biceps tendon are intact.   Acromioclavicular Joint: No significant arthropathy of the acromioclavicular joint. No subacromial/subdeltoid bursal fluid.   Glenohumeral Joint: No joint effusion. Mild partial-thickness cartilage loss of the glenohumeral joint.   Labrum: Grossly intact, but evaluation is limited by lack of intraarticular fluid/contrast.   Bones: No fracture or dislocation. No aggressive osseous lesion.   Other: No fluid collection or hematoma.   IMPRESSION: 1. Mild supraspinatus tendinosis. 2. Mild  partial-thickness cartilage loss of the glenohumeral joint.     Electronically Signed   By: Onnie Bilis M.D.   On: 04/15/2023 13:30  EXAM: MRI CERVICAL SPINE WITHOUT CONTRAST   TECHNIQUE: Multiplanar, multisequence MR imaging of the cervical spine was performed. No intravenous contrast was administered.   COMPARISON:  Cervical spine radiographs 08/19/2020   FINDINGS: Alignment: Physiologic.   Vertebrae: No acute or suspicious osseous findings.   Cord: Normal in signal and caliber.   Posterior Fossa, vertebral arteries, paraspinal tissues: Visualized portions of the posterior fossa appear unremarkable.Bilateral vertebral artery flow voids. No significant paraspinal findings.   Disc levels:   C2-3: Normal interspace.   C3-4: Mild uncinate spurring on the left. No spinal stenosis or significant foraminal narrowing.   C4-5: Spondylosis with loss of disc height and posterior osteophytes covering diffusely bulging disc material. Mild asymmetric uncinate spurring on the right. No central spinal stenosis. Mild-to-moderate right and mild left foraminal narrowing.   C5-6: Spondylosis with loss of disc height and posterior osteophytes covering diffusely bulging disc material. Mild uncinate spurring bilaterally. No significant central spinal stenosis. Mild to moderate foraminal narrowing bilaterally, greater on the right.   C6-7: Normal interspace.   C7-T1: Normal interspace.   IMPRESSION: 1. Cervical spondylosis with disc bulging and uncinate spurring at C4-5 and C5-6. No significant central spinal stenosis. 2. Mild-to-moderate foraminal narrowing bilaterally at C5-6, greater on the right. 3. Mild-to-moderate right and mild left foraminal narrowing at C4-5. 4. No acute findings.     Electronically Signed   By: Sammie Crigler  Redge Cancel M.D.   On: 04/15/2023 14:31  PATIENT SURVEYS:  Modified Oswestry 32% self-perceived moderate disability  and Quick Dash  43.2%  COGNITION: Overall cognitive status: Within functional limits for tasks assessed     SENSATION: WFL  POSTURE: Rounded shoulder/ slight forward head posture in sitting  UPPER EXTREMITY ROM:   Active ROM Right AROM eval Left PROM eval  Shoulder flexion 158 deg. 111 deg. (Pain)- UT compensation  Shoulder extension    Shoulder abduction 155 deg. 74 deg. (Pain)  Shoulder adduction    Shoulder internal rotation 85 deg. 17 deg. (Pain)  Shoulder external rotation 85 deg. 71 deg.  Elbow flexion WNL WNL  Elbow extension WNL WNL  Wrist flexion    Wrist extension    Wrist ulnar deviation    Wrist radial deviation    Wrist pronation    Wrist supination    (Blank rows = not tested)  Cervical AROM: flexion 54 deg./ extension 36 deg./ L rotn. 48 deg./ R rotn. 48 deg./ L lat. Flex. 39 deg./ R lat. Flexion 31 deg. (L UT tightness)  Lumbar AROM: standing flexion 90 deg. (Straight lumbar spine).    UPPER EXTREMITY MMT:  MMT Right eval Left eval  Shoulder flexion 4 NT (pain)  Shoulder extension 4+   Shoulder abduction 4   Shoulder adduction    Shoulder internal rotation 4+   Shoulder external rotation 4   Middle trapezius    Lower trapezius    Elbow flexion 5 4+  Elbow extension 5 4+  Wrist flexion    Wrist extension    Wrist ulnar deviation    Wrist radial deviation    Wrist pronation    Wrist supination    Grip strength (lbs)    (Blank rows = not tested)  SHOULDER SPECIAL TESTS: NT due to L shoulder pain/ limitations.   JOINT MOBILITY TESTING:  L shoulder joint hypomobility as compared to R shoulder  PALPATION:  (+) tenderness in L shoulder/ deltoid musculature                                                                                                                             TREATMENT DATE: 05/30/2023  See evaluation/ HEP   PATIENT EDUCATION: Education details: See HEP Person educated: Patient Education method: Explanation, Demonstration, and  Handouts Education comprehension: verbalized understanding and returned demonstration  HOME EXERCISE PROGRAM: Access Code: [email or web address] URL: [email or web address] Date: 05/30/2023 Prepared by: Hazeline Lister  Exercises - Circular Shoulder Pendulum with Table Support  - 2 x daily - 7 x weekly - 2 sets - 10 reps - Seated Shoulder Flexion AAROM with Pulley Behind  - 2 x daily - 7 x weekly - 2 sets - 10 reps - Seated Shoulder Scaption AAROM with Pulley at Side  - 2 x daily - 7 x weekly - 2 sets - 10 reps - Seated Shoulder Abduction AAROM with Pulley Behind  - 2 x daily - 7 x  weekly - 2 sets - 10 reps  ASSESSMENT:  CLINICAL IMPRESSION: Patient is a pleasant 53 y.o. female who was seen today for physical therapy evaluation and treatment for L shoulder joint stiffness/ neck and back pain.  Pts. Primary issue at this time is limitations/ pain in L shoulder.  Pt. Presents with marked decrease in L shoulder AROM and moderate pain with movement.  Pt. As L UT compensatory movement patterns and rounded shoulder/ forward head posture in sitting.  Pt. Will benefit from skilled PT services to increase L shoulder ROM/ strength to improve pain-free mobility.     OBJECTIVE IMPAIRMENTS: decreased activity tolerance, decreased mobility, decreased ROM, decreased strength, hypomobility, impaired flexibility, impaired UE functional use, improper body mechanics, postural dysfunction, and pain.   ACTIVITY LIMITATIONS: carrying, lifting, standing, reach over head, and locomotion level  PARTICIPATION LIMITATIONS: cleaning, laundry, community activity, and occupation  PERSONAL FACTORS: Fitness and Past/current experiences are also affecting patient's functional outcome.   REHAB POTENTIAL: Good  CLINICAL DECISION MAKING: Evolving/moderate complexity  EVALUATION COMPLEXITY: Moderate   GOALS: Goals reviewed with patient? Yes  SHORT TERM GOALS: Target date: 06/20/23  Pt. Independent with HEP to  increase L shoulder PROM to [facility] as compared to R shoulder to progress to more AROM/ strengthening ex.  Baseline:  see above Goal status: INITIAL  2.  Pt. Will increase cervical R lat. Flexion to [facility] as compared to L with no c/o L UT muscle pain to improve posture.   Baseline:  see above Goal status: INITIAL  LONG TERM GOALS: Target date: 07/25/23  Pt. Will decrease QuickDASH to <20% to improve pain-free UE mobility/ overhead reaching tasks.  Baseline: 43.2% Goal status: INITIAL  2.  Pt. Will decrease MODI to <20% to improve pain-free functional mobility with daily tasks/ body mechanics.   Baseline: 32% self-perceived moderate disability   Goal status: INITIAL  3.  Pt. Will report no neck pain/ headaches consistently for a week to improve ability to work.   Baseline: chronic h/o neck pain/ HA Goal status: INITIAL  PLAN:  PT FREQUENCY: 2x/week  PT DURATION: 8 weeks  PLANNED INTERVENTIONS: 97110-Therapeutic exercises, 97530- Therapeutic activity, [id number]- Neuromuscular re-education, 97535- Self Care, [id number]- Manual therapy, [id number]- Electrical stimulation (unattended), Patient/Family education, Joint mobilization, Cryotherapy, and Moist heat  PLAN FOR NEXT SESSION: Reassess L shoulder ROM  Lendell Quarry, PT, DPT # [phone] 06/11/2023, 7:05 PM

## 2023-06-11 NOTE — Therapy (Signed)
 OUTPATIENT PHYSICAL THERAPY SHOULDER/ BACK TREATMENT  Patient Name: Robin Schwartz MRN: [id number] DOB:04-Oct-1970, 53 y.o., female Today's Date: 06/08/2023  END OF SESSION:  PT End of Session - 06/11/23 1913     Visit Number 4    Number of Visits 16    Date for PT Re-Evaluation 07/25/23    PT Start Time 0820    PT Stop Time 0903    PT Time Calculation (min) 43 min             Past Medical History:  Diagnosis Date   Allergy    Anemia    Anxiety    Arthritis    Emphysema of lung (HCC)    Migraine    Neuromuscular disorder (HCC)    Past Surgical History:  Procedure Laterality Date   BREAST EXCISIONAL BIOPSY Left 1992   BREAST SURGERY  10/23/1989   Remove benign cyst   CYSTOSCOPY  2003   LIPOMA EXCISION Left 2007   shoulder   Patient Active Problem List   Diagnosis Date Noted   Hematochezia 06/08/2023   Biceps tendinitis, left [M75.22] 03/15/2023   Prolonged menstruation 03/09/2023   Trigger thumb, right thumb 02/17/2023   Lightheadedness 02/11/2023   Rotator cuff impingement syndrome of left shoulder 09/10/2022   Greater trochanteric pain syndrome of left lower extremity 09/10/2022   Neuropathy 04/12/2022   Angioedema 04/12/2022   Migraine headache 04/12/2022   Numbness and tingling of both lower extremities 07/17/2021   Postural dizziness with presyncope 07/17/2021   Weakness of both lower extremities 05/15/2021   New daily persistent headache 05/15/2021   Trigeminal neuralgia 03/11/2021   Nocturnal foot cramps 02/04/2021   Panic disorder 10/20/2020   BMI 20.0-20.9, adult 10/02/2020   Chronic fatigue 10/02/2020   History of COVID-19 09/11/2020   Bilateral primary osteoarthritis of knee 09/09/2020   Weight loss, unintentional 09/09/2020   Cervical spondylosis 09/01/2020   Anemia 08/19/2020   Cervical paraspinous muscle spasm 08/19/2020   Thickened endometrium 08/14/2020   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   Iron deficiency anemia 05/02/2020   Heart  palpitations 03/06/2020   Hypersomnia 06/11/2019   Seasonal allergies 06/07/2019   Dysmenorrhea 02/21/2019   MEE (middle ear effusion), bilateral 02/21/2019   Cervical stenosis of spine 06/08/2018   Hand joint pain 06/08/2018   Positive antinuclear antibody 06/08/2018   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   Chronic abdominal pain 10/10/2014   Headache disorder 12/12/2013   Patellofemoral dysfunction 11/15/2013   Stress incontinence 08/10/2013   Thoracolumbar back pain 08/09/2013   Bursitis of both hips 08/09/2013   Other bursitis of hip, unspecified hip 08/09/2013   Personal history of other endocrine, nutritional and metabolic disease 82/95/6213   Iliotibial band syndrome 05/31/2013   Irritable bowel syndrome with constipation 04/27/2013   Gastroesophageal reflux disease 04/04/2013   Eustachian tube dysfunction 11/29/2012   Lesion of sciatic nerve 09/27/2012   Piriformis syndrome 09/27/2012   Generalized anxiety disorder 03/28/2012   Skin sensation disturbance 06/01/2011   Disequilibrium 06/01/2011    PCP: Mimi Alt, MD  REFERRING PROVIDER: Ma Saupe, MD  REFERRING DIAG:  Diagnosis  M54.50,M54.6 (ICD-10-CM) - Thoracolumbar back pain  M47.812 (ICD-10-CM) - Cervical spondylosis  M19.012 (ICD-10-CM) - Osteoarthritis of left shoulder, unspecified osteoarthritis type   THERAPY DIAG:  Chronic left shoulder pain  Shoulder joint stiffness, left  Muscle weakness (generalized)  Other low back pain  Neck pain  Rationale for Evaluation and Treatment: Rehabilitation  ONSET DATE: Chronic   Lendell Quarry, PT, DPT #  [id number] 06/11/2023, 7:14 PM

## 2023-06-11 NOTE — Therapy (Signed)
 OUTPATIENT PHYSICAL THERAPY SHOULDER/ BACK TREATMENT  Patient Name: Robin Schwartz MRN: [id number] DOB:[birth year], 53 y.o., female Today's Date: 06/06/2023  END OF SESSION:  PT End of Session - 06/11/23 1911     Visit Number 3    Number of Visits 16    Date for PT Re-Evaluation 07/25/23    PT Start Time 0903    PT Stop Time 0950    PT Time Calculation (min) 47 min             Past Medical History:  Diagnosis Date   Allergy    Anemia    Anxiety    Arthritis    Emphysema of lung (HCC)    Migraine    Neuromuscular disorder (HCC)    Past Surgical History:  Procedure Laterality Date   BREAST EXCISIONAL BIOPSY Left 1992   BREAST SURGERY  10/23/1989   Remove benign cyst   CYSTOSCOPY  2003   LIPOMA EXCISION Left 2007   shoulder   Patient Active Problem List   Diagnosis Date Noted   Hematochezia 06/08/2023   Biceps tendinitis, left [M75.22] 03/15/2023   Prolonged menstruation 03/09/2023   Trigger thumb, right thumb 02/17/2023   Lightheadedness 02/11/2023   Rotator cuff impingement syndrome of left shoulder 09/10/2022   Greater trochanteric pain syndrome of left lower extremity 09/10/2022   Neuropathy 04/12/2022   Angioedema 04/12/2022   Migraine headache 04/12/2022   Numbness and tingling of both lower extremities 07/17/2021   Postural dizziness with presyncope 07/17/2021   Weakness of both lower extremities 05/15/2021   New daily persistent headache 05/15/2021   Trigeminal neuralgia 03/11/2021   Nocturnal foot cramps 02/04/2021   Panic disorder 10/20/2020   BMI 20.0-20.9, adult 10/02/2020   Chronic fatigue 10/02/2020   History of COVID-19 09/11/2020   Bilateral primary osteoarthritis of knee 09/09/2020   Weight loss, unintentional 09/09/2020   Cervical spondylosis 09/01/2020   Anemia 08/19/2020   Cervical paraspinous muscle spasm 08/19/2020   Thickened endometrium 08/14/2020   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   Iron deficiency anemia 05/02/2020   Heart  palpitations 03/06/2020   Hypersomnia 06/11/2019   Seasonal allergies 06/07/2019   Dysmenorrhea 02/21/2019   MEE (middle ear effusion), bilateral 02/21/2019   Cervical stenosis of spine 06/08/2018   Hand joint pain 06/08/2018   Positive antinuclear antibody 06/08/2018   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   Chronic abdominal pain 10/10/2014   Headache disorder 12/12/2013   Patellofemoral dysfunction 11/15/2013   Stress incontinence 08/10/2013   Thoracolumbar back pain 08/09/2013   Bursitis of both hips 08/09/2013   Other bursitis of hip, unspecified hip 08/09/2013   Personal history of other endocrine, nutritional and metabolic disease 27/07/2374   Iliotibial band syndrome 05/31/2013   Irritable bowel syndrome with constipation 04/27/2013   Gastroesophageal reflux disease 04/04/2013   Eustachian tube dysfunction 11/29/2012   Lesion of sciatic nerve 09/27/2012   Piriformis syndrome 09/27/2012   Generalized anxiety disorder 03/28/2012   Skin sensation disturbance 06/01/2011   Disequilibrium 06/01/2011    PCP: Mimi Alt, MD  REFERRING PROVIDER: Ma Saupe, MD  REFERRING DIAG:  Diagnosis  M54.50,M54.6 (ICD-10-CM) - Thoracolumbar back pain  M47.812 (ICD-10-CM) - Cervical spondylosis  M19.012 (ICD-10-CM) - Osteoarthritis of left shoulder, unspecified osteoarthritis type   THERAPY DIAG:  Chronic left shoulder pain  Shoulder joint stiffness, left  Muscle weakness (generalized)  Other low back pain  Neck pain  Rationale for Evaluation and Treatment: Rehabilitation  ONSET DATE: Chronic   Lendell Quarry, PT, DPT #  [id number] 06/11/2023, 7:12 PM

## 2023-06-11 NOTE — Therapy (Signed)
 OUTPATIENT PHYSICAL THERAPY SHOULDER/ BACK TREATMENT  Patient Name: Robin Schwartz MRN: [id number] DOB:03-Oct-1970, 53 y.o., female Today's Date: 06/01/2023  END OF SESSION:  PT End of Session - 06/11/23 1908     Visit Number 2    Number of Visits 16    Date for PT Re-Evaluation 07/25/23    PT Start Time 1601    PT Stop Time 1647    PT Time Calculation (min) 46 min             Past Medical History:  Diagnosis Date   Allergy    Anemia    Anxiety    Arthritis    Emphysema of lung (HCC)    Migraine    Neuromuscular disorder (HCC)    Past Surgical History:  Procedure Laterality Date   BREAST EXCISIONAL BIOPSY Left 1992   BREAST SURGERY  10/23/1989   Remove benign cyst   CYSTOSCOPY  2003   LIPOMA EXCISION Left 2007   shoulder   Patient Active Problem List   Diagnosis Date Noted   Hematochezia 06/08/2023   Biceps tendinitis, left [M75.22] 03/15/2023   Prolonged menstruation 03/09/2023   Trigger thumb, right thumb 02/17/2023   Lightheadedness 02/11/2023   Rotator cuff impingement syndrome of left shoulder 09/10/2022   Greater trochanteric pain syndrome of left lower extremity 09/10/2022   Neuropathy 04/12/2022   Angioedema 04/12/2022   Migraine headache 04/12/2022   Numbness and tingling of both lower extremities 07/17/2021   Postural dizziness with presyncope 07/17/2021   Weakness of both lower extremities 05/15/2021   New daily persistent headache 05/15/2021   Trigeminal neuralgia 03/11/2021   Nocturnal foot cramps 02/04/2021   Panic disorder 10/20/2020   BMI 20.0-20.9, adult 10/02/2020   Chronic fatigue 10/02/2020   History of COVID-19 09/11/2020   Bilateral primary osteoarthritis of knee 09/09/2020   Weight loss, unintentional 09/09/2020   Cervical spondylosis 09/01/2020   Anemia 08/19/2020   Cervical paraspinous muscle spasm 08/19/2020   Thickened endometrium 08/14/2020   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   Iron deficiency anemia 05/02/2020   Heart  palpitations 03/06/2020   Hypersomnia 06/11/2019   Seasonal allergies 06/07/2019   Dysmenorrhea 02/21/2019   MEE (middle ear effusion), bilateral 02/21/2019   Cervical stenosis of spine 06/08/2018   Hand joint pain 06/08/2018   Positive antinuclear antibody 06/08/2018   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   Chronic abdominal pain 10/10/2014   Headache disorder 12/12/2013   Patellofemoral dysfunction 11/15/2013   Stress incontinence 08/10/2013   Thoracolumbar back pain 08/09/2013   Bursitis of both hips 08/09/2013   Other bursitis of hip, unspecified hip 08/09/2013   Personal history of other endocrine, nutritional and metabolic disease 62/13/0865   Iliotibial band syndrome 05/31/2013   Irritable bowel syndrome with constipation 04/27/2013   Gastroesophageal reflux disease 04/04/2013   Eustachian tube dysfunction 11/29/2012   Lesion of sciatic nerve 09/27/2012   Piriformis syndrome 09/27/2012   Generalized anxiety disorder 03/28/2012   Skin sensation disturbance 06/01/2011   Disequilibrium 06/01/2011    PCP: Mimi Alt, MD  REFERRING PROVIDER: Ma Saupe, MD  REFERRING DIAG:  Diagnosis  M54.50,M54.6 (ICD-10-CM) - Thoracolumbar back pain  M47.812 (ICD-10-CM) - Cervical spondylosis  M19.012 (ICD-10-CM) - Osteoarthritis of left shoulder, unspecified osteoarthritis type   THERAPY DIAG:  Chronic left shoulder pain  Shoulder joint stiffness, left  Muscle weakness (generalized)  Other low back pain  Neck pain  Rationale for Evaluation and Treatment: Rehabilitation  ONSET DATE: Chronic   Lendell Quarry, PT, DPT #  [id number] 06/11/2023, 7:10 PM

## 2023-06-13 ENCOUNTER — Ambulatory Visit: Admitting: Physical Therapy

## 2023-06-13 DIAGNOSIS — M25612 Stiffness of left shoulder, not elsewhere classified: Secondary | ICD-10-CM

## 2023-06-13 DIAGNOSIS — M6281 Muscle weakness (generalized): Secondary | ICD-10-CM

## 2023-06-13 DIAGNOSIS — M5459 Other low back pain: Secondary | ICD-10-CM

## 2023-06-13 DIAGNOSIS — M542 Cervicalgia: Secondary | ICD-10-CM

## 2023-06-13 DIAGNOSIS — M25512 Pain in left shoulder: Secondary | ICD-10-CM | POA: Diagnosis not present

## 2023-06-13 DIAGNOSIS — G8929 Other chronic pain: Secondary | ICD-10-CM

## 2023-06-14 ENCOUNTER — Encounter: Payer: Self-pay | Admitting: Physical Therapy

## 2023-06-14 ENCOUNTER — Encounter: Payer: Self-pay | Admitting: Family Medicine

## 2023-06-14 NOTE — Therapy (Signed)
 OUTPATIENT PHYSICAL THERAPY SHOULDER/ BACK TREATMENT  Patient Name: Robin Schwartz MRN: [id number] DOB:Jun 03, 1970, 53 y.o., female Today's Date: 06/14/2023  END OF SESSION:  PT End of Session - 06/14/23 1824     Visit Number 5    Number of Visits 16    Date for PT Re-Evaluation 07/25/23    PT Start Time 0818    PT Stop Time 0905    PT Time Calculation (min) 47 min             Past Medical History:  Diagnosis Date   Allergy    Anemia    Anxiety    Arthritis    Emphysema of lung (HCC)    Migraine    Neuromuscular disorder (HCC)    Past Surgical History:  Procedure Laterality Date   BREAST EXCISIONAL BIOPSY Left 1992   BREAST SURGERY  10/23/1989   Remove benign cyst   CYSTOSCOPY  2003   LIPOMA EXCISION Left 2007   shoulder   Patient Active Problem List   Diagnosis Date Noted   Hematochezia 06/08/2023   Biceps tendinitis, left [M75.22] 03/15/2023   Prolonged menstruation 03/09/2023   Trigger thumb, right thumb 02/17/2023   Lightheadedness 02/11/2023   Rotator cuff impingement syndrome of left shoulder 09/10/2022   Greater trochanteric pain syndrome of left lower extremity 09/10/2022   Neuropathy 04/12/2022   Angioedema 04/12/2022   Migraine headache 04/12/2022   Numbness and tingling of both lower extremities 07/17/2021   Postural dizziness with presyncope 07/17/2021   Weakness of both lower extremities 05/15/2021   New daily persistent headache 05/15/2021   Trigeminal neuralgia 03/11/2021   Nocturnal foot cramps 02/04/2021   Panic disorder 10/20/2020   BMI 20.0-20.9, adult 10/02/2020   Chronic fatigue 10/02/2020   History of COVID-19 09/11/2020   Bilateral primary osteoarthritis of knee 09/09/2020   Weight loss, unintentional 09/09/2020   Cervical spondylosis 09/01/2020   Anemia 08/19/2020   Cervical paraspinous muscle spasm 08/19/2020   Thickened endometrium 08/14/2020   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   Iron deficiency anemia 05/02/2020   Heart  palpitations 03/06/2020   Hypersomnia 06/11/2019   Seasonal allergies 06/07/2019   Dysmenorrhea 02/21/2019   MEE (middle ear effusion), bilateral 02/21/2019   Cervical stenosis of spine 06/08/2018   Hand joint pain 06/08/2018   Positive antinuclear antibody 06/08/2018   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   Chronic abdominal pain 10/10/2014   Headache disorder 12/12/2013   Patellofemoral dysfunction 11/15/2013   Stress incontinence 08/10/2013   Thoracolumbar back pain 08/09/2013   Bursitis of both hips 08/09/2013   Other bursitis of hip, unspecified hip 08/09/2013   Personal history of other endocrine, nutritional and metabolic disease 40/98/1191   Iliotibial band syndrome 05/31/2013   Irritable bowel syndrome with constipation 04/27/2013   Gastroesophageal reflux disease 04/04/2013   Eustachian tube dysfunction 11/29/2012   Lesion of sciatic nerve 09/27/2012   Piriformis syndrome 09/27/2012   Generalized anxiety disorder 03/28/2012   Skin sensation disturbance 06/01/2011   Disequilibrium 06/01/2011    PCP: Mimi Alt, MD  REFERRING PROVIDER: Ma Saupe, MD  REFERRING DIAG:  Diagnosis  M54.50,M54.6 (ICD-10-CM) - Thoracolumbar back pain  M47.812 (ICD-10-CM) - Cervical spondylosis  M19.012 (ICD-10-CM) - Osteoarthritis of left shoulder, unspecified osteoarthritis type   THERAPY DIAG:  Chronic left shoulder pain  Shoulder joint stiffness, left  Muscle weakness (generalized)  Other low back pain  Neck pain  Rationale for Evaluation and Treatment: Rehabilitation  ONSET DATE: Chronic    SUBJECTIVE:  SUBJECTIVE STATEMENT: Pt. Known to PT clinic.  Pt. Referred to PT with c/o L shoulder pain/limitations and chronic low back/ neck pain/ headaches.  Pts. Primary goal to is  to decrease pain symptoms.  Pt. States her migraines and dizziness limits ability to work.  Pt. States she was pulling on washing machine and injured L shoulder.  Pt. Had cortisone injection with no benefit and PT in [place] for strengthening with marked increase in pain.  See MRI results.   Hand dominance: Right  PERTINENT HISTORY: See MD notes.    PAIN:  Are you having pain? Yes: NPRS scale: 5/10 L shoulder, 6/10 low back  Pain location: sh./neck/back Pain description: persistent/ sharp pain in L shoulder with mvmt.   Aggravating factors: movement Relieving factors: rest  PRECAUTIONS: None  RED FLAGS: None   WEIGHT BEARING RESTRICTIONS: No  FALLS:  Has patient fallen in last 6 months? No  LIVING ENVIRONMENT: Lives with: lives alone Lives in: House/apartment Has following equipment at home: None  OCCUPATION: Real Estate  PLOF: Independent  PATIENT GOALS: Increase L shoulder ROM/ strength.  Decrease pain  NEXT MD VISIT:   OBJECTIVE:  Note: Objective measures were completed at Evaluation unless otherwise noted.  DIAGNOSTIC FINDINGS:  FINDINGS: Rotator cuff: Mild supraspinatus tendinosis. Infraspinatus tendon is intact. Teres minor tendon is intact. Subscapularis tendon is intact.   Muscles: No muscle atrophy or edema. No intramuscular fluid collection or hematoma.   Biceps Long Head: Intraarticular and extraarticular portions of the biceps tendon are intact.   Acromioclavicular Joint: No significant arthropathy of the acromioclavicular joint. No subacromial/subdeltoid bursal fluid.   Glenohumeral Joint: No joint effusion. Mild partial-thickness cartilage loss of the glenohumeral joint.   Labrum: Grossly intact, but evaluation is limited by lack of intraarticular fluid/contrast.   Bones: No fracture or dislocation. No aggressive osseous lesion.   Other: No fluid collection or hematoma.   IMPRESSION: 1. Mild supraspinatus tendinosis. 2. Mild  partial-thickness cartilage loss of the glenohumeral joint.     Electronically Signed   By: Onnie Bilis M.D.   On: 04/15/2023 13:30  EXAM: MRI CERVICAL SPINE WITHOUT CONTRAST   TECHNIQUE: Multiplanar, multisequence MR imaging of the cervical spine was performed. No intravenous contrast was administered.   COMPARISON:  Cervical spine radiographs 08/19/2020   FINDINGS: Alignment: Physiologic.   Vertebrae: No acute or suspicious osseous findings.   Cord: Normal in signal and caliber.   Posterior Fossa, vertebral arteries, paraspinal tissues: Visualized portions of the posterior fossa appear unremarkable.Bilateral vertebral artery flow voids. No significant paraspinal findings.   Disc levels:   C2-3: Normal interspace.   C3-4: Mild uncinate spurring on the left. No spinal stenosis or significant foraminal narrowing.   C4-5: Spondylosis with loss of disc height and posterior osteophytes covering diffusely bulging disc material. Mild asymmetric uncinate spurring on the right. No central spinal stenosis. Mild-to-moderate right and mild left foraminal narrowing.   C5-6: Spondylosis with loss of disc height and posterior osteophytes covering diffusely bulging disc material. Mild uncinate spurring bilaterally. No significant central spinal stenosis. Mild to moderate foraminal narrowing bilaterally, greater on the right.   C6-7: Normal interspace.   C7-T1: Normal interspace.   IMPRESSION: 1. Cervical spondylosis with disc bulging and uncinate spurring at C4-5 and C5-6. No significant central spinal stenosis. 2. Mild-to-moderate foraminal narrowing bilaterally at C5-6, greater on the right. 3. Mild-to-moderate right and mild left foraminal narrowing at C4-5. 4. No acute findings.     Electronically Signed   By: Sammie Crigler  Redge Cancel M.D.   On: 04/15/2023 14:31  PATIENT SURVEYS:  Modified Oswestry 32% self-perceived moderate disability  and Quick Dash  43.2%  COGNITION: Overall cognitive status: Within functional limits for tasks assessed     SENSATION: WFL  POSTURE: Rounded shoulder/ slight forward head posture in sitting  UPPER EXTREMITY ROM:   Active ROM Right AROM eval Left PROM eval  Shoulder flexion 158 deg. 111 deg. (Pain)- UT compensation  Shoulder extension    Shoulder abduction 155 deg. 74 deg. (Pain)  Shoulder adduction    Shoulder internal rotation 85 deg. 17 deg. (Pain)  Shoulder external rotation 85 deg. 71 deg.  Elbow flexion WNL WNL  Elbow extension WNL WNL  Wrist flexion    Wrist extension    Wrist ulnar deviation    Wrist radial deviation    Wrist pronation    Wrist supination    (Blank rows = not tested)  Cervical AROM: flexion 54 deg./ extension 36 deg./ L rotn. 48 deg./ R rotn. 48 deg./ L lat. Flex. 39 deg./ R lat. Flexion 31 deg. (L UT tightness)  Lumbar AROM: standing flexion 90 deg. (Straight lumbar spine).    UPPER EXTREMITY MMT:  MMT Right eval Left eval  Shoulder flexion 4 NT (pain)  Shoulder extension 4+   Shoulder abduction 4   Shoulder adduction    Shoulder internal rotation 4+   Shoulder external rotation 4   Middle trapezius    Lower trapezius    Elbow flexion 5 4+  Elbow extension 5 4+  Wrist flexion    Wrist extension    Wrist ulnar deviation    Wrist radial deviation    Wrist pronation    Wrist supination    Grip strength (lbs)    (Blank rows = not tested)  SHOULDER SPECIAL TESTS: NT due to L shoulder pain/ limitations.   JOINT MOBILITY TESTING:  L shoulder joint hypomobility as compared to R shoulder  PALPATION:  (+) tenderness in L shoulder/ deltoid musculature  4/9: L shoulder AA/PROM: flexion 114 deg./ abduction 114 deg. (Pain limited)   4/16:  reassessment of L shoulder AROM in supine (flexion 110 deg./ abduction 74 deg./ ER 27 deg./ IR 55 deg.).                                                                                                                             TREATMENT DATE: 06/14/2023  Subjective:  Pt. Reports 4/10 L shoulder pain and 2/10 neck pain currently at rest prior to tx. Session.  Pt. States her neck felt good after last PT tx. Session.     There.ex.:  Discussed HEP  Seated shoulder pulley: flexion/ scaption/ abduction 10x each (warm-up)- reviewed posture.  Cuing to increase static holds at tolerable end-range during pulley ex.    Seated UBE:  1 min. F/b/f/b.  Consistent cadence (no increase c/o pain).    Seated wand ex.: chest press/ L shoulder abduction 10x each.  Standing  wand ex: sh. Extension/ IR/ chest press (manual feedback to L UT/ posterior deltoid)- 10x each.     Supine wand ex.: horizontal abduction/ adduction/ flexion (pain tolerable range)- 10x each.    Reviewed HEP.  PT will issue isometric/ resisted ex. Next tx.    Manual tx.:  Supine L shoulder AA/PROM (all planes)- 10x each.    Supine L shoulder contract-relax technique for IR/ER (limited ER)  Seated STM to L shoulder/ UT musculature during L shoulder ROM ex.   Supine cervical stretches: L/R UT and levator stretches with static holds 3x each.    PATIENT EDUCATION: Education details: See HEP Person educated: Patient Education method: Explanation, Demonstration, and Handouts Education comprehension: verbalized understanding and returned demonstration  HOME EXERCISE PROGRAM: Access Code: [email or web address] URL: [email or web address] Date: 05/30/2023 Prepared by: Hazeline Lister  Exercises - Circular Shoulder Pendulum with Table Support  - 2 x daily - 7 x weekly - 2 sets - 10 reps - Seated Shoulder Flexion AAROM with Pulley Behind  - 2 x daily - 7 x weekly - 2 sets - 10 reps - Seated Shoulder Scaption AAROM with Pulley at Side  - 2 x daily - 7 x weekly - 2 sets - 10 reps - Seated Shoulder Abduction AAROM with Pulley Behind  - 2 x daily - 7 x weekly - 2 sets - 10 reps  Access Code: [email or web address] URL: [email or web address] Date:  06/01/2023 Prepared by: Hazeline Lister  Exercises - Supine Lower Trunk Rotation  - 1 x daily - 5 x weekly - 1 sets - 10 reps - Supine Figure 4 Piriformis Stretch  - 1 x daily - 5 x weekly - 1 sets - 10 reps - Supine Posterior Pelvic Tilt  - 1 x daily - 5 x weekly - 1 sets - 10 reps - Supine March  - 1 x daily - 5 x weekly - 1 sets - 10 reps - Supine Shoulder Press AAROM in Abduction with Dowel  - 1 x daily - 5 x weekly - 1 sets - 10 reps - Supine Shoulder Flexion AAROM with Dowel  - 1 x daily - 5 x weekly - 1 sets - 10 reps - Single Arm Doorway Pec Stretch at 60 Elevation  - 1 x daily - 5 x weekly - 1 sets - 5 reps  ASSESSMENT:  CLINICAL IMPRESSION: Pt. Presents with an increase in L shoulder AA/PROM during tx as compared to last weeks sessions.  Pt. Instructed to remain active with daily tasks/ HEP.  Pt. Able to move around PT clinic/ mat table with good mechanics and no increase c/o back pain.  Increase cervical ROM noted during manual UT/levator stretches.  L UT muscle tightness present and pt. [facility] from [facility].   Pt. Will benefit from skilled PT services to increase L shoulder ROM/ strength to improve pain-free mobility.     OBJECTIVE IMPAIRMENTS: decreased activity tolerance, decreased mobility, decreased ROM, decreased strength, hypomobility, impaired flexibility, impaired UE functional use, improper body mechanics, postural dysfunction, and pain.   ACTIVITY LIMITATIONS: carrying, lifting, standing, reach over head, and locomotion level  PARTICIPATION LIMITATIONS: cleaning, laundry, community activity, and occupation  PERSONAL FACTORS: Fitness and Past/current experiences are also affecting patient's functional outcome.   REHAB POTENTIAL: Good  CLINICAL DECISION MAKING: Evolving/moderate complexity  EVALUATION COMPLEXITY: Moderate   GOALS: Goals reviewed with patient? Yes  SHORT TERM GOALS: Target date: 06/20/23  Pt. Independent with HEP to increase L shoulder PROM to [facility] as  compared to R shoulder  to progress to more AROM/ strengthening ex.  Baseline:  see above Goal status: INITIAL  2.  Pt. Will increase cervical R lat. Flexion to [facility] as compared to L with no c/o L UT muscle pain to improve posture.   Baseline:  see above Goal status: INITIAL  LONG TERM GOALS: Target date: 07/25/23  Pt. Will decrease QuickDASH to <20% to improve pain-free UE mobility/ overhead reaching tasks.  Baseline: 43.2% Goal status: INITIAL  2.  Pt. Will decrease MODI to <20% to improve pain-free functional mobility with daily tasks/ body mechanics.   Baseline: 32% self-perceived moderate disability   Goal status: INITIAL  3.  Pt. Will report no neck pain/ headaches consistently for a week to improve ability to work.   Baseline: chronic h/o neck pain/ HA Goal status: INITIAL  PLAN:  PT FREQUENCY: 2x/week  PT DURATION: 8 weeks  PLANNED INTERVENTIONS: 97110-Therapeutic exercises, 97530- Therapeutic activity, [id number]- Neuromuscular re-education, 97535- Self Care, [id number]- Manual therapy, [id number]- Electrical stimulation (unattended), Patient/Family education, Joint mobilization, Cryotherapy, and Moist heat  PLAN FOR NEXT SESSION:  Reassess L shoulder A/PROM in seated posture after manual tx.  Gentle manual L shoulder isometrics/ resisted scapular ex.     Lendell Quarry, PT, DPT # [phone] 06/14/2023, 6:26 PM

## 2023-06-14 NOTE — Addendum Note (Signed)
 Addended by: Lendell Quarry on: 06/14/2023 05:45 PM   Modules accepted: Orders

## 2023-06-15 ENCOUNTER — Ambulatory Visit

## 2023-06-15 ENCOUNTER — Encounter: Payer: Self-pay | Admitting: Physical Therapy

## 2023-06-15 DIAGNOSIS — M6281 Muscle weakness (generalized): Secondary | ICD-10-CM

## 2023-06-15 DIAGNOSIS — M5459 Other low back pain: Secondary | ICD-10-CM

## 2023-06-15 DIAGNOSIS — M25612 Stiffness of left shoulder, not elsewhere classified: Secondary | ICD-10-CM

## 2023-06-15 DIAGNOSIS — G8929 Other chronic pain: Secondary | ICD-10-CM

## 2023-06-15 DIAGNOSIS — M542 Cervicalgia: Secondary | ICD-10-CM

## 2023-06-15 DIAGNOSIS — M25512 Pain in left shoulder: Secondary | ICD-10-CM | POA: Diagnosis not present

## 2023-06-15 NOTE — Therapy (Signed)
 OUTPATIENT PHYSICAL THERAPY SHOULDER/ BACK TREATMENT  Patient Name: Robin Schwartz MRN: [id number] DOB:Dec 06, 1970, 53 y.o., female Today's Date: 06/15/2023  END OF SESSION:  PT End of Session - 06/15/23 0819     Visit Number 6    Number of Visits 16    Date for PT Re-Evaluation 07/25/23    PT Start Time 0818    PT Stop Time 0857    PT Time Calculation (min) 39 min              Past Medical History:  Diagnosis Date   Allergy    Anemia    Anxiety    Arthritis    Emphysema of lung (HCC)    Migraine    Neuromuscular disorder (HCC)    Past Surgical History:  Procedure Laterality Date   BREAST EXCISIONAL BIOPSY Left 1992   BREAST SURGERY  10/23/1989   Remove benign cyst   CYSTOSCOPY  2003   LIPOMA EXCISION Left 2007   shoulder   Patient Active Problem List   Diagnosis Date Noted   Hematochezia 06/08/2023   Biceps tendinitis, left [M75.22] 03/15/2023   Prolonged menstruation 03/09/2023   Trigger thumb, right thumb 02/17/2023   Lightheadedness 02/11/2023   Rotator cuff impingement syndrome of left shoulder 09/10/2022   Greater trochanteric pain syndrome of left lower extremity 09/10/2022   Neuropathy 04/12/2022   Angioedema 04/12/2022   Migraine headache 04/12/2022   Numbness and tingling of both lower extremities 07/17/2021   Postural dizziness with presyncope 07/17/2021   Weakness of both lower extremities 05/15/2021   New daily persistent headache 05/15/2021   Trigeminal neuralgia 03/11/2021   Nocturnal foot cramps 02/04/2021   Panic disorder 10/20/2020   BMI 20.0-20.9, adult 10/02/2020   Chronic fatigue 10/02/2020   History of COVID-19 09/11/2020   Bilateral primary osteoarthritis of knee 09/09/2020   Weight loss, unintentional 09/09/2020   Cervical spondylosis 09/01/2020   Anemia 08/19/2020   Cervical paraspinous muscle spasm 08/19/2020   Thickened endometrium 08/14/2020   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   Iron deficiency anemia 05/02/2020   Heart  palpitations 03/06/2020   Hypersomnia 06/11/2019   Seasonal allergies 06/07/2019   Dysmenorrhea 02/21/2019   MEE (middle ear effusion), bilateral 02/21/2019   Cervical stenosis of spine 06/08/2018   Hand joint pain 06/08/2018   Positive antinuclear antibody 06/08/2018   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   Chronic abdominal pain 10/10/2014   Headache disorder 12/12/2013   Patellofemoral dysfunction 11/15/2013   Stress incontinence 08/10/2013   Thoracolumbar back pain 08/09/2013   Bursitis of both hips 08/09/2013   Other bursitis of hip, unspecified hip 08/09/2013   Personal history of other endocrine, nutritional and metabolic disease 82/95/6213   Iliotibial band syndrome 05/31/2013   Irritable bowel syndrome with constipation 04/27/2013   Gastroesophageal reflux disease 04/04/2013   Eustachian tube dysfunction 11/29/2012   Lesion of sciatic nerve 09/27/2012   Piriformis syndrome 09/27/2012   Generalized anxiety disorder 03/28/2012   Skin sensation disturbance 06/01/2011   Disequilibrium 06/01/2011    PCP: Mimi Alt, MD  REFERRING PROVIDER: Ma Saupe, MD  REFERRING DIAG:  Diagnosis  M54.50,M54.6 (ICD-10-CM) - Thoracolumbar back pain  M47.812 (ICD-10-CM) - Cervical spondylosis  M19.012 (ICD-10-CM) - Osteoarthritis of left shoulder, unspecified osteoarthritis type   THERAPY DIAG:  Chronic left shoulder pain  Shoulder joint stiffness, left  Muscle weakness (generalized)  Other low back pain  Neck pain  Rationale for Evaluation and Treatment: Rehabilitation  ONSET DATE: Chronic    SUBJECTIVE:  SUBJECTIVE STATEMENT: Pt. Known to PT clinic.  Pt. Referred to PT with c/o L shoulder pain/limitations and chronic low back/ neck pain/ headaches.  Pts. Primary goal to is  to decrease pain symptoms.  Pt. States her migraines and dizziness limits ability to work.  Pt. States she was pulling on washing machine and injured L shoulder.  Pt. Had cortisone injection with no benefit and PT in [place] for strengthening with marked increase in pain.  See MRI results.   Hand dominance: Right  PERTINENT HISTORY: See MD notes.    PAIN:  Are you having pain? Yes: NPRS scale: 5/10 L shoulder, 6/10 low back  Pain location: sh./neck/back Pain description: persistent/ sharp pain in L shoulder with mvmt.   Aggravating factors: movement Relieving factors: rest  PRECAUTIONS: None  RED FLAGS: None   WEIGHT BEARING RESTRICTIONS: No  FALLS:  Has patient fallen in last 6 months? No  LIVING ENVIRONMENT: Lives with: lives alone Lives in: House/apartment Has following equipment at home: None  OCCUPATION: Real Estate  PLOF: Independent  PATIENT GOALS: Increase L shoulder ROM/ strength.  Decrease pain  NEXT MD VISIT:   OBJECTIVE:  Note: Objective measures were completed at Evaluation unless otherwise noted.  DIAGNOSTIC FINDINGS:  FINDINGS: Rotator cuff: Mild supraspinatus tendinosis. Infraspinatus tendon is intact. Teres minor tendon is intact. Subscapularis tendon is intact.   Muscles: No muscle atrophy or edema. No intramuscular fluid collection or hematoma.   Biceps Long Head: Intraarticular and extraarticular portions of the biceps tendon are intact.   Acromioclavicular Joint: No significant arthropathy of the acromioclavicular joint. No subacromial/subdeltoid bursal fluid.   Glenohumeral Joint: No joint effusion. Mild partial-thickness cartilage loss of the glenohumeral joint.   Labrum: Grossly intact, but evaluation is limited by lack of intraarticular fluid/contrast.   Bones: No fracture or dislocation. No aggressive osseous lesion.   Other: No fluid collection or hematoma.   IMPRESSION: 1. Mild supraspinatus tendinosis. 2. Mild  partial-thickness cartilage loss of the glenohumeral joint.     Electronically Signed   By: Onnie Bilis M.D.   On: 04/15/2023 13:30  EXAM: MRI CERVICAL SPINE WITHOUT CONTRAST   TECHNIQUE: Multiplanar, multisequence MR imaging of the cervical spine was performed. No intravenous contrast was administered.   COMPARISON:  Cervical spine radiographs 08/19/2020   FINDINGS: Alignment: Physiologic.   Vertebrae: No acute or suspicious osseous findings.   Cord: Normal in signal and caliber.   Posterior Fossa, vertebral arteries, paraspinal tissues: Visualized portions of the posterior fossa appear unremarkable.Bilateral vertebral artery flow voids. No significant paraspinal findings.   Disc levels:   C2-3: Normal interspace.   C3-4: Mild uncinate spurring on the left. No spinal stenosis or significant foraminal narrowing.   C4-5: Spondylosis with loss of disc height and posterior osteophytes covering diffusely bulging disc material. Mild asymmetric uncinate spurring on the right. No central spinal stenosis. Mild-to-moderate right and mild left foraminal narrowing.   C5-6: Spondylosis with loss of disc height and posterior osteophytes covering diffusely bulging disc material. Mild uncinate spurring bilaterally. No significant central spinal stenosis. Mild to moderate foraminal narrowing bilaterally, greater on the right.   C6-7: Normal interspace.   C7-T1: Normal interspace.   IMPRESSION: 1. Cervical spondylosis with disc bulging and uncinate spurring at C4-5 and C5-6. No significant central spinal stenosis. 2. Mild-to-moderate foraminal narrowing bilaterally at C5-6, greater on the right. 3. Mild-to-moderate right and mild left foraminal narrowing at C4-5. 4. No acute findings.     Electronically Signed   By: Sammie Crigler  Redge Cancel M.D.   On: 04/15/2023 14:31  PATIENT SURVEYS:  Modified Oswestry 32% self-perceived moderate disability  and Quick Dash  43.2%  COGNITION: Overall cognitive status: Within functional limits for tasks assessed     SENSATION: WFL  POSTURE: Rounded shoulder/ slight forward head posture in sitting  UPPER EXTREMITY ROM:   Active ROM Right AROM eval Left PROM eval  Shoulder flexion 158 deg. 111 deg. (Pain)- UT compensation  Shoulder extension    Shoulder abduction 155 deg. 74 deg. (Pain)  Shoulder adduction    Shoulder internal rotation 85 deg. 17 deg. (Pain)  Shoulder external rotation 85 deg. 71 deg.  Elbow flexion WNL WNL  Elbow extension WNL WNL  Wrist flexion    Wrist extension    Wrist ulnar deviation    Wrist radial deviation    Wrist pronation    Wrist supination    (Blank rows = not tested)  Cervical AROM: flexion 54 deg./ extension 36 deg./ L rotn. 48 deg./ R rotn. 48 deg./ L lat. Flex. 39 deg./ R lat. Flexion 31 deg. (L UT tightness)  Lumbar AROM: standing flexion 90 deg. (Straight lumbar spine).    UPPER EXTREMITY MMT:  MMT Right eval Left eval  Shoulder flexion 4 NT (pain)  Shoulder extension 4+   Shoulder abduction 4   Shoulder adduction    Shoulder internal rotation 4+   Shoulder external rotation 4   Middle trapezius    Lower trapezius    Elbow flexion 5 4+  Elbow extension 5 4+  Wrist flexion    Wrist extension    Wrist ulnar deviation    Wrist radial deviation    Wrist pronation    Wrist supination    Grip strength (lbs)    (Blank rows = not tested)  SHOULDER SPECIAL TESTS: NT due to L shoulder pain/ limitations.   JOINT MOBILITY TESTING:  L shoulder joint hypomobility as compared to R shoulder  PALPATION:  (+) tenderness in L shoulder/ deltoid musculature  4/9: L shoulder AA/PROM: flexion 114 deg./ abduction 114 deg. (Pain limited)   4/16:  reassessment of L shoulder AROM in supine (flexion 110 deg./ abduction 74 deg./ ER 27 deg./ IR 55 deg.).                                                                                                                             TREATMENT DATE: 06/15/2023   Subjective:  Patient reports she has been having back pain since last session. Her shoulder hasn't been giving her as much of a problem.    There.ex.:   Seated shoulder pulley: flexion/ scaption/ abduction 10x each (warm-up)- reviewed posture.  Cuing to increase static holds at tolerable end-range during pulley ex.   Wall shoulder ladder x 10 forward flexion/abduction    L shoulder isometrics flexion/abduction/IR/ER x 10 with 3 second hold each direction - updated HEP   Seated UBE:  1 min. F/b/f/b.  Consistent cadence (no increase  c/o pain).    Standing row with RTB 2 x 10    Standing shoulder extension YTB 2 x 10     Not today:  Manual tx.:  Supine L shoulder AA/PROM (all planes)- 10x each.    Supine L shoulder contract-relax technique for IR/ER (limited ER)  Seated STM to L shoulder/ UT musculature during L shoulder ROM ex.   Supine cervical stretches: L/R UT and levator stretches with static holds 3x each.    PATIENT EDUCATION: Education details: See HEP Person educated: Patient Education method: Explanation, Demonstration, and Handouts Education comprehension: verbalized understanding and returned demonstration  HOME EXERCISE PROGRAM: Access Code: [email or web address] URL: [email or web address] Date: 05/30/2023 Prepared by: Hazeline Lister  Exercises - Circular Shoulder Pendulum with Table Support  - 2 x daily - 7 x weekly - 2 sets - 10 reps - Seated Shoulder Flexion AAROM with Pulley Behind  - 2 x daily - 7 x weekly - 2 sets - 10 reps - Seated Shoulder Scaption AAROM with Pulley at Side  - 2 x daily - 7 x weekly - 2 sets - 10 reps - Seated Shoulder Abduction AAROM with Pulley Behind  - 2 x daily - 7 x weekly - 2 sets - 10 reps  Access Code: [email or web address] URL: [email or web address] Date: 06/01/2023 Prepared by: Hazeline Lister  Exercises - Supine Lower Trunk Rotation  - 1 x daily - 5 x weekly - 1 sets - 10  reps - Supine Figure 4 Piriformis Stretch  - 1 x daily - 5 x weekly - 1 sets - 10 reps - Supine Posterior Pelvic Tilt  - 1 x daily - 5 x weekly - 1 sets - 10 reps - Supine March  - 1 x daily - 5 x weekly - 1 sets - 10 reps - Supine Shoulder Press AAROM in Abduction with Dowel  - 1 x daily - 5 x weekly - 1 sets - 10 reps - Supine Shoulder Flexion AAROM with Dowel  - 1 x daily - 5 x weekly - 1 sets - 10 reps - Single Arm Doorway Pec Stretch at 60 Elevation  - 1 x daily - 5 x weekly - 1 sets - 5 reps  Access Code: [email or web address] URL: [email or web address] Date: 06/15/2023 Prepared by: Janine Melbourne  Exercises - Standing Isometric Shoulder Flexion with Doorway - Arm Bent  - 1 x daily - 5 x weekly - 2 sets - 10 reps - Standing Isometric Shoulder Abduction with Doorway - Arm Bent  - 1 x daily - 5 x weekly - 2 sets - 10 reps - Standing Isometric Shoulder Internal Rotation at Doorway  - 1 x daily - 5 x weekly - 2 sets - 10 reps - Standing Isometric Shoulder External Rotation with Doorway  - 1 x daily - 5 x weekly - 2 sets - 10 reps  ASSESSMENT:  CLINICAL IMPRESSION:   Session focused on introducing L shoulder isometrics with HEP updated and light resistance scapular strengthening. Tolerated session well. Pt. Will benefit from skilled PT services to increase L shoulder ROM/ strength to improve pain-free mobility.     OBJECTIVE IMPAIRMENTS: decreased activity tolerance, decreased mobility, decreased ROM, decreased strength, hypomobility, impaired flexibility, impaired UE functional use, improper body mechanics, postural dysfunction, and pain.   ACTIVITY LIMITATIONS: carrying, lifting, standing, reach over head, and locomotion level  PARTICIPATION LIMITATIONS: cleaning, laundry, community activity, and occupation  PERSONAL FACTORS: Fitness and Past/current experiences are also affecting patient's functional outcome.   REHAB POTENTIAL: Good  CLINICAL DECISION MAKING: Evolving/moderate  complexity  EVALUATION COMPLEXITY: Moderate   GOALS: Goals reviewed with patient? Yes  SHORT TERM GOALS: Target date: 06/20/23  Pt. Independent with HEP to increase L shoulder PROM to [facility] as compared to R shoulder to progress to more AROM/ strengthening ex.  Baseline:  see above Goal status: INITIAL  2.  Pt. Will increase cervical R lat. Flexion to [facility] as compared to L with no c/o L UT muscle pain to improve posture.   Baseline:  see above Goal status: INITIAL  LONG TERM GOALS: Target date: 07/25/23  Pt. Will decrease QuickDASH to <20% to improve pain-free UE mobility/ overhead reaching tasks.  Baseline: 43.2% Goal status: INITIAL  2.  Pt. Will decrease MODI to <20% to improve pain-free functional mobility with daily tasks/ body mechanics.   Baseline: 32% self-perceived moderate disability   Goal status: INITIAL  3.  Pt. Will report no neck pain/ headaches consistently for a week to improve ability to work.   Baseline: chronic h/o neck pain/ HA Goal status: INITIAL  PLAN:  PT FREQUENCY: 2x/week  PT DURATION: 8 weeks  PLANNED INTERVENTIONS: 97110-Therapeutic exercises, 97530- Therapeutic activity, [id number]- Neuromuscular re-education, 97535- Self Care, [id number]- Manual therapy, [id number]- Electrical stimulation (unattended), Patient/Family education, Joint mobilization, Cryotherapy, and Moist heat  PLAN FOR NEXT SESSION:  Reassess L shoulder A/PROM in seated posture after manual tx.  Gentle manual L shoulder isometrics/ resisted scapular ex.     Janine Melbourne, PT, DPT Physical Therapist - [place]  [facility]  06/15/2023, 8:20 AM

## 2023-06-16 ENCOUNTER — Inpatient Hospital Stay: Admitting: Family Medicine

## 2023-06-18 ENCOUNTER — Encounter: Payer: Self-pay | Admitting: Physical Therapy

## 2023-06-20 ENCOUNTER — Ambulatory Visit: Admitting: Physical Therapy

## 2023-06-22 ENCOUNTER — Ambulatory Visit: Admitting: Physical Therapy

## 2023-06-22 ENCOUNTER — Encounter: Payer: Self-pay | Admitting: Physical Therapy

## 2023-06-22 DIAGNOSIS — M25512 Pain in left shoulder: Secondary | ICD-10-CM | POA: Diagnosis not present

## 2023-06-22 DIAGNOSIS — M6281 Muscle weakness (generalized): Secondary | ICD-10-CM

## 2023-06-22 DIAGNOSIS — M5459 Other low back pain: Secondary | ICD-10-CM

## 2023-06-22 DIAGNOSIS — M25612 Stiffness of left shoulder, not elsewhere classified: Secondary | ICD-10-CM

## 2023-06-22 DIAGNOSIS — G8929 Other chronic pain: Secondary | ICD-10-CM

## 2023-06-22 DIAGNOSIS — M542 Cervicalgia: Secondary | ICD-10-CM

## 2023-06-22 NOTE — Therapy (Signed)
 OUTPATIENT PHYSICAL THERAPY SHOULDER/ BACK TREATMENT  Patient Name: Robin Schwartz MRN: [id number] DOB:03-16-1970, 53 y.o., female Today's Date: 06/22/2023  END OF SESSION:  PT End of Session - 06/22/23 0820     Visit Number 7    Number of Visits 16    Date for PT Re-Evaluation 07/25/23    PT Start Time 0820    PT Stop Time 0902    PT Time Calculation (min) 42 min            Past Medical History:  Diagnosis Date   Allergy    Anemia    Anxiety    Arthritis    Emphysema of lung (HCC)    Migraine    Neuromuscular disorder (HCC)    Past Surgical History:  Procedure Laterality Date   BREAST EXCISIONAL BIOPSY Left 1992   BREAST SURGERY  10/23/1989   Remove benign cyst   CYSTOSCOPY  2003   LIPOMA EXCISION Left 2007   shoulder   Patient Active Problem List   Diagnosis Date Noted   Hematochezia 06/08/2023   Biceps tendinitis, left [M75.22] 03/15/2023   Prolonged menstruation 03/09/2023   Trigger thumb, right thumb 02/17/2023   Lightheadedness 02/11/2023   Rotator cuff impingement syndrome of left shoulder 09/10/2022   Greater trochanteric pain syndrome of left lower extremity 09/10/2022   Neuropathy 04/12/2022   Angioedema 04/12/2022   Migraine headache 04/12/2022   Numbness and tingling of both lower extremities 07/17/2021   Postural dizziness with presyncope 07/17/2021   Weakness of both lower extremities 05/15/2021   New daily persistent headache 05/15/2021   Trigeminal neuralgia 03/11/2021   Nocturnal foot cramps 02/04/2021   Panic disorder 10/20/2020   BMI 20.0-20.9, adult 10/02/2020   Chronic fatigue 10/02/2020   History of COVID-19 09/11/2020   Bilateral primary osteoarthritis of knee 09/09/2020   Weight loss, unintentional 09/09/2020   Cervical spondylosis 09/01/2020   Anemia 08/19/2020   Cervical paraspinous muscle spasm 08/19/2020   Thickened endometrium 08/14/2020   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   Iron deficiency anemia 05/02/2020   Heart  palpitations 03/06/2020   Hypersomnia 06/11/2019   Seasonal allergies 06/07/2019   Dysmenorrhea 02/21/2019   MEE (middle ear effusion), bilateral 02/21/2019   Cervical stenosis of spine 06/08/2018   Hand joint pain 06/08/2018   Positive antinuclear antibody 06/08/2018   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   Chronic abdominal pain 10/10/2014   Headache disorder 12/12/2013   Patellofemoral dysfunction 11/15/2013   Stress incontinence 08/10/2013   Thoracolumbar back pain 08/09/2013   Bursitis of both hips 08/09/2013   Other bursitis of hip, unspecified hip 08/09/2013   Personal history of other endocrine, nutritional and metabolic disease 56/21/3086   Iliotibial band syndrome 05/31/2013   Irritable bowel syndrome with constipation 04/27/2013   Gastroesophageal reflux disease 04/04/2013   Eustachian tube dysfunction 11/29/2012   Lesion of sciatic nerve 09/27/2012   Piriformis syndrome 09/27/2012   Generalized anxiety disorder 03/28/2012   Skin sensation disturbance 06/01/2011   Disequilibrium 06/01/2011    PCP: Mimi Alt, MD  REFERRING PROVIDER: Ma Saupe, MD  REFERRING DIAG:  Diagnosis  M54.50,M54.6 (ICD-10-CM) - Thoracolumbar back pain  M47.812 (ICD-10-CM) - Cervical spondylosis  M19.012 (ICD-10-CM) - Osteoarthritis of left shoulder, unspecified osteoarthritis type   THERAPY DIAG:  Chronic left shoulder pain  Shoulder joint stiffness, left  Muscle weakness (generalized)  Other low back pain  Neck pain  Rationale for Evaluation and Treatment: Rehabilitation  ONSET DATE: Chronic    SUBJECTIVE:  SUBJECTIVE STATEMENT: Pt. Known to PT clinic.  Pt. Referred to PT with c/o L shoulder pain/limitations and chronic low back/ neck pain/ headaches.  Pts. Primary goal to is  to decrease pain symptoms.  Pt. States her migraines and dizziness limits ability to work.  Pt. States she was pulling on washing machine and injured L shoulder.  Pt. Had cortisone injection with no benefit and PT in [place] for strengthening with marked increase in pain.  See MRI results.   Hand dominance: Right  PERTINENT HISTORY: See MD notes.    PAIN:  Are you having pain? Yes: NPRS scale: 5/10 L shoulder, 6/10 low back  Pain location: sh./neck/back Pain description: persistent/ sharp pain in L shoulder with mvmt.   Aggravating factors: movement Relieving factors: rest  PRECAUTIONS: None  RED FLAGS: None   WEIGHT BEARING RESTRICTIONS: No  FALLS:  Has patient fallen in last 6 months? No  LIVING ENVIRONMENT: Lives with: lives alone Lives in: House/apartment Has following equipment at home: None  OCCUPATION: Real Estate  PLOF: Independent  PATIENT GOALS: Increase L shoulder ROM/ strength.  Decrease pain  NEXT MD VISIT:   OBJECTIVE:  Note: Objective measures were completed at Evaluation unless otherwise noted.  DIAGNOSTIC FINDINGS:  FINDINGS: Rotator cuff: Mild supraspinatus tendinosis. Infraspinatus tendon is intact. Teres minor tendon is intact. Subscapularis tendon is intact.   Muscles: No muscle atrophy or edema. No intramuscular fluid collection or hematoma.   Biceps Long Head: Intraarticular and extraarticular portions of the biceps tendon are intact.   Acromioclavicular Joint: No significant arthropathy of the acromioclavicular joint. No subacromial/subdeltoid bursal fluid.   Glenohumeral Joint: No joint effusion. Mild partial-thickness cartilage loss of the glenohumeral joint.   Labrum: Grossly intact, but evaluation is limited by lack of intraarticular fluid/contrast.   Bones: No fracture or dislocation. No aggressive osseous lesion.   Other: No fluid collection or hematoma.   IMPRESSION: 1. Mild supraspinatus tendinosis. 2. Mild  partial-thickness cartilage loss of the glenohumeral joint.     Electronically Signed   By: Onnie Bilis M.D.   On: 04/15/2023 13:30  EXAM: MRI CERVICAL SPINE WITHOUT CONTRAST   TECHNIQUE: Multiplanar, multisequence MR imaging of the cervical spine was performed. No intravenous contrast was administered.   COMPARISON:  Cervical spine radiographs 08/19/2020   FINDINGS: Alignment: Physiologic.   Vertebrae: No acute or suspicious osseous findings.   Cord: Normal in signal and caliber.   Posterior Fossa, vertebral arteries, paraspinal tissues: Visualized portions of the posterior fossa appear unremarkable.Bilateral vertebral artery flow voids. No significant paraspinal findings.   Disc levels:   C2-3: Normal interspace.   C3-4: Mild uncinate spurring on the left. No spinal stenosis or significant foraminal narrowing.   C4-5: Spondylosis with loss of disc height and posterior osteophytes covering diffusely bulging disc material. Mild asymmetric uncinate spurring on the right. No central spinal stenosis. Mild-to-moderate right and mild left foraminal narrowing.   C5-6: Spondylosis with loss of disc height and posterior osteophytes covering diffusely bulging disc material. Mild uncinate spurring bilaterally. No significant central spinal stenosis. Mild to moderate foraminal narrowing bilaterally, greater on the right.   C6-7: Normal interspace.   C7-T1: Normal interspace.   IMPRESSION: 1. Cervical spondylosis with disc bulging and uncinate spurring at C4-5 and C5-6. No significant central spinal stenosis. 2. Mild-to-moderate foraminal narrowing bilaterally at C5-6, greater on the right. 3. Mild-to-moderate right and mild left foraminal narrowing at C4-5. 4. No acute findings.     Electronically Signed   By: Sammie Crigler  Redge Cancel M.D.   On: 04/15/2023 14:31  PATIENT SURVEYS:  Modified Oswestry 32% self-perceived moderate disability  and Quick Dash  43.2%  COGNITION: Overall cognitive status: Within functional limits for tasks assessed     SENSATION: WFL  POSTURE: Rounded shoulder/ slight forward head posture in sitting  UPPER EXTREMITY ROM:   Active ROM Right AROM eval Left PROM eval  Shoulder flexion 158 deg. 111 deg. (Pain)- UT compensation  Shoulder extension    Shoulder abduction 155 deg. 74 deg. (Pain)  Shoulder adduction    Shoulder internal rotation 85 deg. 17 deg. (Pain)  Shoulder external rotation 85 deg. 71 deg.  Elbow flexion WNL WNL  Elbow extension WNL WNL  Wrist flexion    Wrist extension    Wrist ulnar deviation    Wrist radial deviation    Wrist pronation    Wrist supination    (Blank rows = not tested)  Cervical AROM: flexion 54 deg./ extension 36 deg./ L rotn. 48 deg./ R rotn. 48 deg./ L lat. Flex. 39 deg./ R lat. Flexion 31 deg. (L UT tightness)  Lumbar AROM: standing flexion 90 deg. (Straight lumbar spine).    UPPER EXTREMITY MMT:  MMT Right eval Left eval  Shoulder flexion 4 NT (pain)  Shoulder extension 4+   Shoulder abduction 4   Shoulder adduction    Shoulder internal rotation 4+   Shoulder external rotation 4   Middle trapezius    Lower trapezius    Elbow flexion 5 4+  Elbow extension 5 4+  Wrist flexion    Wrist extension    Wrist ulnar deviation    Wrist radial deviation    Wrist pronation    Wrist supination    Grip strength (lbs)    (Blank rows = not tested)  SHOULDER SPECIAL TESTS: NT due to L shoulder pain/ limitations.   JOINT MOBILITY TESTING:  L shoulder joint hypomobility as compared to R shoulder  PALPATION:  (+) tenderness in L shoulder/ deltoid musculature  4/9: L shoulder AA/PROM: flexion 114 deg./ abduction 114 deg. (Pain limited)   4/16:  reassessment of L shoulder AROM in supine (flexion 110 deg./ abduction 74 deg./ ER 27 deg./ IR 55 deg.).                                                                                                                             TREATMENT DATE: 06/22/2023   Subjective:  Pt. Reports no L shoulder pain currently.  Pt. Reports tension in neck and stiffness/ tightness in low back.  Pt. Has been active this past week and busy day today.  Pt. Still waiting on x-ray results from 2 weeks ago for L rib pain.    There.ex.:   Seated L shoulder A/AROM   Seated UBE:  1 min. F/b/f/b.  Consistent cadence (no increase c/o pain).  Good upright posture/ head position.  Discussed L rib pain (no imaging results)- 3/10 pain reported.  Standing L shoulder AROM: flexion 121 deg./ abduction 104 deg.    Wall shoulder ladder x 5 forward flexion (127 deg.)/abduction (115 deg.).    Standing row with RTB 2 x 10    Standing shoulder extension RTB 2 x 10  Standing shoulder ER ("W") RTB 2 x 10    Discussed L shoulder isometrics flexion/abduction/IR/ER x 10 with 3 second hold each direction - updated HEP   Manual tx.:  Supine L shoulder AA/PROM (all planes)- 5x each.    Seated STM to L shoulder/ UT musculature during L shoulder ROM ex.   Supine cervical stretches: L/R UT and levator stretches with static holds 3x each.    Supine L shoulder horizontal abduction/ adduction 5x with holds.  (+) L shoulder impingement with L shoulder adduction.    PATIENT EDUCATION: Education details: See HEP Person educated: Patient Education method: Explanation, Demonstration, and Handouts Education comprehension: verbalized understanding and returned demonstration  HOME EXERCISE PROGRAM: Access Code: [email or web address] URL: [email or web address] Date: 05/30/2023 Prepared by: Hazeline Lister  Exercises - Circular Shoulder Pendulum with Table Support  - 2 x daily - 7 x weekly - 2 sets - 10 reps - Seated Shoulder Flexion AAROM with Pulley Behind  - 2 x daily - 7 x weekly - 2 sets - 10 reps - Seated Shoulder Scaption AAROM with Pulley at Side  - 2 x daily - 7 x weekly - 2 sets - 10 reps - Seated Shoulder Abduction AAROM with  Pulley Behind  - 2 x daily - 7 x weekly - 2 sets - 10 reps  Access Code: [email or web address] URL: [email or web address] Date: 06/01/2023 Prepared by: Hazeline Lister  Exercises - Supine Lower Trunk Rotation  - 1 x daily - 5 x weekly - 1 sets - 10 reps - Supine Figure 4 Piriformis Stretch  - 1 x daily - 5 x weekly - 1 sets - 10 reps - Supine Posterior Pelvic Tilt  - 1 x daily - 5 x weekly - 1 sets - 10 reps - Supine March  - 1 x daily - 5 x weekly - 1 sets - 10 reps - Supine Shoulder Press AAROM in Abduction with Dowel  - 1 x daily - 5 x weekly - 1 sets - 10 reps - Supine Shoulder Flexion AAROM with Dowel  - 1 x daily - 5 x weekly - 1 sets - 10 reps - Single Arm Doorway Pec Stretch at 60 Elevation  - 1 x daily - 5 x weekly - 1 sets - 5 reps  Access Code: [email or web address] URL: [email or web address] Date: 06/15/2023 Prepared by: Janine Melbourne  Exercises - Standing Isometric Shoulder Flexion with Doorway - Arm Bent  - 1 x daily - 5 x weekly - 2 sets - 10 reps - Standing Isometric Shoulder Abduction with Doorway - Arm Bent  - 1 x daily - 5 x weekly - 2 sets - 10 reps - Standing Isometric Shoulder Internal Rotation at Doorway  - 1 x daily - 5 x weekly - 2 sets - 10 reps - Standing Isometric Shoulder External Rotation with Doorway  - 1 x daily - 5 x weekly - 2 sets - 10 reps  ASSESSMENT:  CLINICAL IMPRESSION:   Pt. Demonstrates slow but consistent progress with L shoulder ROM ex. Pain limited in L shoulder/ ribs/ mid. Back during standing ex.  Pt. Fatigued after tx. Session and instructed to remain compliant with consistent ex. Program.  No change to HEP at this time.  Pt. Will benefit  from skilled PT services to increase L shoulder ROM/ strength to improve pain-free mobility.     OBJECTIVE IMPAIRMENTS: decreased activity tolerance, decreased mobility, decreased ROM, decreased strength, hypomobility, impaired flexibility, impaired UE functional use, improper body mechanics, postural  dysfunction, and pain.   ACTIVITY LIMITATIONS: carrying, lifting, standing, reach over head, and locomotion level  PARTICIPATION LIMITATIONS: cleaning, laundry, community activity, and occupation  PERSONAL FACTORS: Fitness and Past/current experiences are also affecting patient's functional outcome.   REHAB POTENTIAL: Good  CLINICAL DECISION MAKING: Evolving/moderate complexity  EVALUATION COMPLEXITY: Moderate   GOALS: Goals reviewed with patient? Yes  SHORT TERM GOALS: Target date: 06/20/23  Pt. Independent with HEP to increase L shoulder PROM to [facility] as compared to R shoulder to progress to more AROM/ strengthening ex.  Baseline:  see above Goal status: Not met  2.  Pt. Will increase cervical R lat. Flexion to [facility] as compared to L with no c/o L UT muscle pain to improve posture.   Baseline:  see above Goal status: Partially met  LONG TERM GOALS: Target date: 07/25/23  Pt. Will decrease QuickDASH to <20% to improve pain-free UE mobility/ overhead reaching tasks.  Baseline: 43.2% Goal status: INITIAL  2.  Pt. Will decrease MODI to <20% to improve pain-free functional mobility with daily tasks/ body mechanics.   Baseline: 32% self-perceived moderate disability   Goal status: INITIAL  3.  Pt. Will report no neck pain/ headaches consistently for a week to improve ability to work.   Baseline: chronic h/o neck pain/ HA Goal status: INITIAL  PLAN:  PT FREQUENCY: 2x/week  PT DURATION: 8 weeks  PLANNED INTERVENTIONS: 97110-Therapeutic exercises, 97530- Therapeutic activity, [id number]- Neuromuscular re-education, 97535- Self Care, [id number]- Manual therapy, [id number]- Electrical stimulation (unattended), Patient/Family education, Joint mobilization, Cryotherapy, and Moist heat  PLAN FOR NEXT SESSION:  Reassess L shoulder A/PROM in seated posture after manual tx.  Gentle manual L shoulder isometrics/ resisted scapular ex.    Lendell Quarry, PT, DPT # [phone] Physical Therapist - [facility]  06/22/2023, 10:10 AM

## 2023-06-27 ENCOUNTER — Ambulatory Visit: Payer: Self-pay | Admitting: Physical Therapy

## 2023-06-29 ENCOUNTER — Ambulatory Visit: Payer: Self-pay | Admitting: Physical Therapy

## 2023-07-01 ENCOUNTER — Encounter: Payer: Self-pay | Admitting: Family Medicine

## 2023-07-04 ENCOUNTER — Encounter: Payer: Self-pay | Admitting: Physical Therapy

## 2023-07-04 ENCOUNTER — Ambulatory Visit: Payer: Self-pay | Attending: Family Medicine | Admitting: Physical Therapy

## 2023-07-04 DIAGNOSIS — M6281 Muscle weakness (generalized): Secondary | ICD-10-CM | POA: Insufficient documentation

## 2023-07-04 DIAGNOSIS — M25512 Pain in left shoulder: Secondary | ICD-10-CM | POA: Insufficient documentation

## 2023-07-04 DIAGNOSIS — G8929 Other chronic pain: Secondary | ICD-10-CM | POA: Insufficient documentation

## 2023-07-04 DIAGNOSIS — M25612 Stiffness of left shoulder, not elsewhere classified: Secondary | ICD-10-CM | POA: Insufficient documentation

## 2023-07-04 DIAGNOSIS — M5459 Other low back pain: Secondary | ICD-10-CM | POA: Insufficient documentation

## 2023-07-04 DIAGNOSIS — M542 Cervicalgia: Secondary | ICD-10-CM | POA: Insufficient documentation

## 2023-07-04 NOTE — Therapy (Signed)
 OUTPATIENT PHYSICAL THERAPY SHOULDER/ BACK TREATMENT  Patient Name: Robin Schwartz MRN: [id number] DOB:[birth year], 53 y.o., female Today's Date: 07/04/2023  END OF SESSION:  PT End of Session - 07/04/23 0817     Visit Number 8    Number of Visits 16    Date for PT Re-Evaluation 07/25/23    PT Start Time 0817    PT Stop Time 0902    PT Time Calculation (min) 45 min            Past Medical History:  Diagnosis Date   Allergy    Anemia    Anxiety    Arthritis    Emphysema of lung (HCC)    Migraine    Neuromuscular disorder (HCC)    Past Surgical History:  Procedure Laterality Date   BREAST EXCISIONAL BIOPSY Left 1992   BREAST SURGERY  10/23/1989   Remove benign cyst   CYSTOSCOPY  2003   LIPOMA EXCISION Left 2007   shoulder   Patient Active Problem List   Diagnosis Date Noted   Hematochezia 06/08/2023   Biceps tendinitis, left [M75.22] 03/15/2023   Prolonged menstruation 03/09/2023   Trigger thumb, right thumb 02/17/2023   Lightheadedness 02/11/2023   Rotator cuff impingement syndrome of left shoulder 09/10/2022   Greater trochanteric pain syndrome of left lower extremity 09/10/2022   Neuropathy 04/12/2022   Angioedema 04/12/2022   Migraine headache 04/12/2022   Numbness and tingling of both lower extremities 07/17/2021   Postural dizziness with presyncope 07/17/2021   Weakness of both lower extremities 05/15/2021   New daily persistent headache 05/15/2021   Trigeminal neuralgia 03/11/2021   Nocturnal foot cramps 02/04/2021   Panic disorder 10/20/2020   BMI 20.0-20.9, adult 10/02/2020   Chronic fatigue 10/02/2020   History of COVID-19 09/11/2020   Bilateral primary osteoarthritis of knee 09/09/2020   Weight loss, unintentional 09/09/2020   Cervical spondylosis 09/01/2020   Anemia 08/19/2020   Cervical paraspinous muscle spasm 08/19/2020   Thickened endometrium 08/14/2020   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   Iron deficiency anemia 05/02/2020   Heart  palpitations 03/06/2020   Hypersomnia 06/11/2019   Seasonal allergies 06/07/2019   Dysmenorrhea 02/21/2019   MEE (middle ear effusion), bilateral 02/21/2019   Cervical stenosis of spine 06/08/2018   Hand joint pain 06/08/2018   Positive antinuclear antibody 06/08/2018   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   Chronic abdominal pain 10/10/2014   Headache disorder 12/12/2013   Patellofemoral dysfunction 11/15/2013   Stress incontinence 08/10/2013   Thoracolumbar back pain 08/09/2013   Bursitis of both hips 08/09/2013   Other bursitis of hip, unspecified hip 08/09/2013   Personal history of other endocrine, nutritional and metabolic disease 40/98/1191   Iliotibial band syndrome 05/31/2013   Irritable bowel syndrome with constipation 04/27/2013   Gastroesophageal reflux disease 04/04/2013   Eustachian tube dysfunction 11/29/2012   Lesion of sciatic nerve 09/27/2012   Piriformis syndrome 09/27/2012   Generalized anxiety disorder 03/28/2012   Skin sensation disturbance 06/01/2011   Disequilibrium 06/01/2011    PCP: Mimi Alt, MD  REFERRING PROVIDER: Ma Saupe, MD  REFERRING DIAG:  Diagnosis  M54.50,M54.6 (ICD-10-CM) - Thoracolumbar back pain  M47.812 (ICD-10-CM) - Cervical spondylosis  M19.012 (ICD-10-CM) - Osteoarthritis of left shoulder, unspecified osteoarthritis type   THERAPY DIAG:  Chronic left shoulder pain  Shoulder joint stiffness, left  Muscle weakness (generalized)  Other low back pain  Neck pain  Rationale for Evaluation and Treatment: Rehabilitation  ONSET DATE: Chronic    SUBJECTIVE:  SUBJECTIVE STATEMENT: Pt. Known to PT clinic.  Pt. Referred to PT with c/o L shoulder pain/limitations and chronic low back/ neck pain/ headaches.  Pts. Primary goal to is  to decrease pain symptoms.  Pt. States her migraines and dizziness limits ability to work.  Pt. States she was pulling on washing machine and injured L shoulder.  Pt. Had cortisone injection with no benefit and PT in [place] for strengthening with marked increase in pain.  See MRI results.   Hand dominance: Right  PERTINENT HISTORY: See MD notes.    PAIN:  Are you having pain? Yes: NPRS scale: 5/10 L shoulder, 6/10 low back  Pain location: sh./neck/back Pain description: persistent/ sharp pain in L shoulder with mvmt.   Aggravating factors: movement Relieving factors: rest  PRECAUTIONS: None  RED FLAGS: None   WEIGHT BEARING RESTRICTIONS: No  FALLS:  Has patient fallen in last 6 months? No  LIVING ENVIRONMENT: Lives with: lives alone Lives in: House/apartment Has following equipment at home: None  OCCUPATION: Real Estate  PLOF: Independent  PATIENT GOALS: Increase L shoulder ROM/ strength.  Decrease pain  NEXT MD VISIT:   OBJECTIVE:  Note: Objective measures were completed at Evaluation unless otherwise noted.  DIAGNOSTIC FINDINGS:  FINDINGS: Rotator cuff: Mild supraspinatus tendinosis. Infraspinatus tendon is intact. Teres minor tendon is intact. Subscapularis tendon is intact.   Muscles: No muscle atrophy or edema. No intramuscular fluid collection or hematoma.   Biceps Long Head: Intraarticular and extraarticular portions of the biceps tendon are intact.   Acromioclavicular Joint: No significant arthropathy of the acromioclavicular joint. No subacromial/subdeltoid bursal fluid.   Glenohumeral Joint: No joint effusion. Mild partial-thickness cartilage loss of the glenohumeral joint.   Labrum: Grossly intact, but evaluation is limited by lack of intraarticular fluid/contrast.   Bones: No fracture or dislocation. No aggressive osseous lesion.   Other: No fluid collection or hematoma.   IMPRESSION: 1. Mild supraspinatus tendinosis. 2. Mild  partial-thickness cartilage loss of the glenohumeral joint.     Electronically Signed   By: Onnie Bilis M.D.   On: 04/15/2023 13:30  EXAM: MRI CERVICAL SPINE WITHOUT CONTRAST   TECHNIQUE: Multiplanar, multisequence MR imaging of the cervical spine was performed. No intravenous contrast was administered.   COMPARISON:  Cervical spine radiographs 08/19/2020   FINDINGS: Alignment: Physiologic.   Vertebrae: No acute or suspicious osseous findings.   Cord: Normal in signal and caliber.   Posterior Fossa, vertebral arteries, paraspinal tissues: Visualized portions of the posterior fossa appear unremarkable.Bilateral vertebral artery flow voids. No significant paraspinal findings.   Disc levels:   C2-3: Normal interspace.   C3-4: Mild uncinate spurring on the left. No spinal stenosis or significant foraminal narrowing.   C4-5: Spondylosis with loss of disc height and posterior osteophytes covering diffusely bulging disc material. Mild asymmetric uncinate spurring on the right. No central spinal stenosis. Mild-to-moderate right and mild left foraminal narrowing.   C5-6: Spondylosis with loss of disc height and posterior osteophytes covering diffusely bulging disc material. Mild uncinate spurring bilaterally. No significant central spinal stenosis. Mild to moderate foraminal narrowing bilaterally, greater on the right.   C6-7: Normal interspace.   C7-T1: Normal interspace.   IMPRESSION: 1. Cervical spondylosis with disc bulging and uncinate spurring at C4-5 and C5-6. No significant central spinal stenosis. 2. Mild-to-moderate foraminal narrowing bilaterally at C5-6, greater on the right. 3. Mild-to-moderate right and mild left foraminal narrowing at C4-5. 4. No acute findings.     Electronically Signed   By: Sammie Crigler  Redge Cancel M.D.   On: 04/15/2023 14:31  PATIENT SURVEYS:  Modified Oswestry 32% self-perceived moderate disability  and Quick Dash  43.2%  COGNITION: Overall cognitive status: Within functional limits for tasks assessed     SENSATION: WFL  POSTURE: Rounded shoulder/ slight forward head posture in sitting  UPPER EXTREMITY ROM:   Active ROM Right AROM eval Left PROM eval  Shoulder flexion 158 deg. 111 deg. (Pain)- UT compensation  Shoulder extension    Shoulder abduction 155 deg. 74 deg. (Pain)  Shoulder adduction    Shoulder internal rotation 85 deg. 17 deg. (Pain)  Shoulder external rotation 85 deg. 71 deg.  Elbow flexion WNL WNL  Elbow extension WNL WNL  Wrist flexion    Wrist extension    Wrist ulnar deviation    Wrist radial deviation    Wrist pronation    Wrist supination    (Blank rows = not tested)  Cervical AROM: flexion 54 deg./ extension 36 deg./ L rotn. 48 deg./ R rotn. 48 deg./ L lat. Flex. 39 deg./ R lat. Flexion 31 deg. (L UT tightness)  Lumbar AROM: standing flexion 90 deg. (Straight lumbar spine).    UPPER EXTREMITY MMT:  MMT Right eval Left eval  Shoulder flexion 4 NT (pain)  Shoulder extension 4+   Shoulder abduction 4   Shoulder adduction    Shoulder internal rotation 4+   Shoulder external rotation 4   Middle trapezius    Lower trapezius    Elbow flexion 5 4+  Elbow extension 5 4+  Wrist flexion    Wrist extension    Wrist ulnar deviation    Wrist radial deviation    Wrist pronation    Wrist supination    Grip strength (lbs)    (Blank rows = not tested)  SHOULDER SPECIAL TESTS: NT due to L shoulder pain/ limitations.   JOINT MOBILITY TESTING:  L shoulder joint hypomobility as compared to R shoulder  PALPATION:  (+) tenderness in L shoulder/ deltoid musculature  4/9: L shoulder AA/PROM: flexion 114 deg./ abduction 114 deg. (Pain limited)   4/16:  reassessment of L shoulder AROM in supine (flexion 110 deg./ abduction 74 deg./ ER 27 deg./ IR 55 deg.).                                                                                                                             TREATMENT DATE: 07/04/2023   Subjective:  Pt. Had a rough week with a family emergency in [place].  Pt. Had to fly up last Thursday and will have to return soon.  Pt. Reports L shoulder stiffness today and hasn't been able to do HEP due to family situation.  Pt. Reports no issues or fractures in ribs after receiving results of x-ray.  There.ex.:   Standing L shoulder A/AROM at mirror.  L shoulder joint stiffness/ pain with overhead reaching.   Seated UBE:  2 min. F/b.  Consistent cadence (slight increase c/o pain/  fatigue).  Good upright posture/ head position.    Wall ladder for L shoulder forward flexion 5x (131 deg.)/abduction 5x (124 deg.).  Increase ROM today as compared to last tx.    Standing L shoulder AROM: flexion 128 deg./ abduction 119 deg.  Improvement noted from last tx.   Standing wand ex.: chest press/ extension 20x each.  Pain limited/ fatigued with attempted sh. Flexion today.    Discussed L shoulder isometrics flexion/abduction/IR/ER x 10 with 3 second hold each direction - updated HEP   Manual tx.:  Supine L shoulder AA/PROM (all planes)- 5x each.  Pain limited/ guarded.    Seated/ supine STM to L shoulder/ UT musculature during L shoulder ROM ex.  Trigger point in L UT musculature noted.   Supine cervical stretches: L/R UT and levator stretches with static holds 3x each.    Supine L shoulder horizontal abduction/ adduction 5x with holds.  (+) L shoulder impingement with L shoulder adduction.    NOT TODAY Standing row with RTB 2 x 10    Standing shoulder extension RTB 2 x 10  Standing shoulder ER ("W") RTB 2 x 10    PATIENT EDUCATION: Education details: See HEP Person educated: Patient Education method: Explanation, Demonstration, and Handouts Education comprehension: verbalized understanding and returned demonstration  HOME EXERCISE PROGRAM: Access Code: [email or web address] URL: [email or web address] Date: 05/30/2023 Prepared by:  Hazeline Lister  Exercises - Circular Shoulder Pendulum with Table Support  - 2 x daily - 7 x weekly - 2 sets - 10 reps - Seated Shoulder Flexion AAROM with Pulley Behind  - 2 x daily - 7 x weekly - 2 sets - 10 reps - Seated Shoulder Scaption AAROM with Pulley at Side  - 2 x daily - 7 x weekly - 2 sets - 10 reps - Seated Shoulder Abduction AAROM with Pulley Behind  - 2 x daily - 7 x weekly - 2 sets - 10 reps  Access Code: [email or web address] URL: [email or web address] Date: 06/01/2023 Prepared by: Hazeline Lister  Exercises - Supine Lower Trunk Rotation  - 1 x daily - 5 x weekly - 1 sets - 10 reps - Supine Figure 4 Piriformis Stretch  - 1 x daily - 5 x weekly - 1 sets - 10 reps - Supine Posterior Pelvic Tilt  - 1 x daily - 5 x weekly - 1 sets - 10 reps - Supine March  - 1 x daily - 5 x weekly - 1 sets - 10 reps - Supine Shoulder Press AAROM in Abduction with Dowel  - 1 x daily - 5 x weekly - 1 sets - 10 reps - Supine Shoulder Flexion AAROM with Dowel  - 1 x daily - 5 x weekly - 1 sets - 10 reps - Single Arm Doorway Pec Stretch at 60 Elevation  - 1 x daily - 5 x weekly - 1 sets - 5 reps  Access Code: [email or web address] URL: [email or web address] Date: 06/15/2023 Prepared by: Janine Melbourne  Exercises - Standing Isometric Shoulder Flexion with Doorway - Arm Bent  - 1 x daily - 5 x weekly - 2 sets - 10 reps - Standing Isometric Shoulder Abduction with Doorway - Arm Bent  - 1 x daily - 5 x weekly - 2 sets - 10 reps - Standing Isometric Shoulder Internal Rotation at Doorway  - 1 x daily - 5 x weekly - 2 sets - 10 reps - Standing Isometric Shoulder External Rotation with Doorway  - 1 x daily - 5 x weekly -  2 sets - 10 reps  ASSESSMENT:  CLINICAL IMPRESSION:   Pt. Demonstrates slow but consistent progress with L shoulder ROM ex. Pain limited in L shoulder/ ribs/ mid. Back during standing ex.  Pt. Fatigued after tx. Session and instructed to remain compliant with consistent ex. Program.   No change to HEP at this time.  Pt. Will benefit from skilled PT services to increase L shoulder ROM/ strength to improve pain-free mobility.     OBJECTIVE IMPAIRMENTS: decreased activity tolerance, decreased mobility, decreased ROM, decreased strength, hypomobility, impaired flexibility, impaired UE functional use, improper body mechanics, postural dysfunction, and pain.   ACTIVITY LIMITATIONS: carrying, lifting, standing, reach over head, and locomotion level  PARTICIPATION LIMITATIONS: cleaning, laundry, community activity, and occupation  PERSONAL FACTORS: Fitness and Past/current experiences are also affecting patient's functional outcome.   REHAB POTENTIAL: Good  CLINICAL DECISION MAKING: Evolving/moderate complexity  EVALUATION COMPLEXITY: Moderate   GOALS: Goals reviewed with patient? Yes  SHORT TERM GOALS: Target date: 06/20/23  Pt. Independent with HEP to increase L shoulder PROM to [facility] as compared to R shoulder to progress to more AROM/ strengthening ex.  Baseline:  see above Goal status: Not met  2.  Pt. Will increase cervical R lat. Flexion to [facility] as compared to L with no c/o L UT muscle pain to improve posture.   Baseline:  see above Goal status: Partially met  LONG TERM GOALS: Target date: 07/25/23  Pt. Will decrease QuickDASH to <20% to improve pain-free UE mobility/ overhead reaching tasks.  Baseline: 43.2% Goal status: INITIAL  2.  Pt. Will decrease MODI to <20% to improve pain-free functional mobility with daily tasks/ body mechanics.   Baseline: 32% self-perceived moderate disability   Goal status: INITIAL  3.  Pt. Will report no neck pain/ headaches consistently for a week to improve ability to work.   Baseline: chronic h/o neck pain/ HA Goal status: INITIAL  PLAN:  PT FREQUENCY: 2x/week  PT DURATION: 8 weeks  PLANNED INTERVENTIONS: 97110-Therapeutic exercises, 97530- Therapeutic activity, [id number]- Neuromuscular re-education, 97535- Self Care, [id number]-  Manual therapy, [id number]- Electrical stimulation (unattended), Patient/Family education, Joint mobilization, Cryotherapy, and Moist heat  PLAN FOR NEXT SESSION:  Reassess L shoulder A/PROM in seated posture after manual tx.  Resisted there.ex. (RTB).    Lendell Quarry, PT, DPT # [id number] Physical Therapist - [facility]  07/04/2023, 10:09 AM

## 2023-07-05 ENCOUNTER — Ambulatory Visit: Admitting: Family Medicine

## 2023-07-05 NOTE — Therapy (Incomplete)
 OUTPATIENT PHYSICAL THERAPY SHOULDER/ BACK TREATMENT  Patient Name: Robin Schwartz MRN: [id number] DOB:1970-03-18, 53 y.o., female Today's Date: 07/05/2023  END OF SESSION:   Past Medical History:  Diagnosis Date   Allergy    Anemia    Anxiety    Arthritis    Emphysema of lung (HCC)    Migraine    Neuromuscular disorder (HCC)    Past Surgical History:  Procedure Laterality Date   BREAST EXCISIONAL BIOPSY Left 1992   BREAST SURGERY  10/23/1989   Remove benign cyst   CYSTOSCOPY  2003   LIPOMA EXCISION Left 2007   shoulder   Patient Active Problem List   Diagnosis Date Noted   Hematochezia 06/08/2023   Biceps tendinitis, left [M75.22] 03/15/2023   Prolonged menstruation 03/09/2023   Trigger thumb, right thumb 02/17/2023   Lightheadedness 02/11/2023   Rotator cuff impingement syndrome of left shoulder 09/10/2022   Greater trochanteric pain syndrome of left lower extremity 09/10/2022   Neuropathy 04/12/2022   Angioedema 04/12/2022   Migraine headache 04/12/2022   Numbness and tingling of both lower extremities 07/17/2021   Postural dizziness with presyncope 07/17/2021   Weakness of both lower extremities 05/15/2021   New daily persistent headache 05/15/2021   Trigeminal neuralgia 03/11/2021   Nocturnal foot cramps 02/04/2021   Panic disorder 10/20/2020   BMI 20.0-20.9, adult 10/02/2020   Chronic fatigue 10/02/2020   History of COVID-19 09/11/2020   Bilateral primary osteoarthritis of knee 09/09/2020   Weight loss, unintentional 09/09/2020   Cervical spondylosis 09/01/2020   Anemia 08/19/2020   Cervical paraspinous muscle spasm 08/19/2020   Thickened endometrium 08/14/2020   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   Iron deficiency anemia 05/02/2020   Heart palpitations 03/06/2020   Hypersomnia 06/11/2019   Seasonal allergies 06/07/2019   Dysmenorrhea 02/21/2019   MEE (middle ear effusion), bilateral 02/21/2019   Cervical stenosis of spine 06/08/2018   Hand joint  pain 06/08/2018   Positive antinuclear antibody 06/08/2018   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   Chronic abdominal pain 10/10/2014   Headache disorder 12/12/2013   Patellofemoral dysfunction 11/15/2013   Stress incontinence 08/10/2013   Thoracolumbar back pain 08/09/2013   Bursitis of both hips 08/09/2013   Other bursitis of hip, unspecified hip 08/09/2013   Personal history of other endocrine, nutritional and metabolic disease 34/74/2595   Iliotibial band syndrome 05/31/2013   Irritable bowel syndrome with constipation 04/27/2013   Gastroesophageal reflux disease 04/04/2013   Eustachian tube dysfunction 11/29/2012   Lesion of sciatic nerve 09/27/2012   Piriformis syndrome 09/27/2012   Generalized anxiety disorder 03/28/2012   Skin sensation disturbance 06/01/2011   Disequilibrium 06/01/2011    PCP: Mimi Alt, MD  REFERRING PROVIDER: Ma Saupe, MD  REFERRING DIAG:  Diagnosis  M54.50,M54.6 (ICD-10-CM) - Thoracolumbar back pain  M47.812 (ICD-10-CM) - Cervical spondylosis  M19.012 (ICD-10-CM) - Osteoarthritis of left shoulder, unspecified osteoarthritis type   THERAPY DIAG:  Chronic left shoulder pain  Shoulder joint stiffness, left  Muscle weakness (generalized)  Other low back pain  Neck pain  Rationale for Evaluation and Treatment: Rehabilitation  ONSET DATE: Chronic    SUBJECTIVE:  SUBJECTIVE STATEMENT: Pt. Known to PT clinic.  Pt. Referred to PT with c/o L shoulder pain/limitations and chronic low back/ neck pain/ headaches.  Pts. Primary goal to is to decrease pain symptoms.  Pt. States her migraines and dizziness limits ability to work.  Pt. States she was pulling on washing machine and injured L shoulder.  Pt. Had cortisone injection with no benefit and PT  in [place] for strengthening with marked increase in pain.  See MRI results.   Hand dominance: Right  PERTINENT HISTORY: See MD notes.    PAIN:  Are you having pain? Yes: NPRS scale: 5/10 L shoulder, 6/10 low back  Pain location: sh./neck/back Pain description: persistent/ sharp pain in L shoulder with mvmt.   Aggravating factors: movement Relieving factors: rest  PRECAUTIONS: None  RED FLAGS: None   WEIGHT BEARING RESTRICTIONS: No  FALLS:  Has patient fallen in last 6 months? No  LIVING ENVIRONMENT: Lives with: lives alone Lives in: House/apartment Has following equipment at home: None  OCCUPATION: Real Estate  PLOF: Independent  PATIENT GOALS: Increase L shoulder ROM/ strength.  Decrease pain  NEXT MD VISIT:   OBJECTIVE:  Note: Objective measures were completed at Evaluation unless otherwise noted.  DIAGNOSTIC FINDINGS:  FINDINGS: Rotator cuff: Mild supraspinatus tendinosis. Infraspinatus tendon is intact. Teres minor tendon is intact. Subscapularis tendon is intact.   Muscles: No muscle atrophy or edema. No intramuscular fluid collection or hematoma.   Biceps Long Head: Intraarticular and extraarticular portions of the biceps tendon are intact.   Acromioclavicular Joint: No significant arthropathy of the acromioclavicular joint. No subacromial/subdeltoid bursal fluid.   Glenohumeral Joint: No joint effusion. Mild partial-thickness cartilage loss of the glenohumeral joint.   Labrum: Grossly intact, but evaluation is limited by lack of intraarticular fluid/contrast.   Bones: No fracture or dislocation. No aggressive osseous lesion.   Other: No fluid collection or hematoma.   IMPRESSION: 1. Mild supraspinatus tendinosis. 2. Mild partial-thickness cartilage loss of the glenohumeral joint.     Electronically Signed   By: Onnie Bilis M.D.   On: 04/15/2023 13:30  EXAM: MRI CERVICAL SPINE WITHOUT CONTRAST   TECHNIQUE: Multiplanar, multisequence  MR imaging of the cervical spine was performed. No intravenous contrast was administered.   COMPARISON:  Cervical spine radiographs 08/19/2020   FINDINGS: Alignment: Physiologic.   Vertebrae: No acute or suspicious osseous findings.   Cord: Normal in signal and caliber.   Posterior Fossa, vertebral arteries, paraspinal tissues: Visualized portions of the posterior fossa appear unremarkable.Bilateral vertebral artery flow voids. No significant paraspinal findings.   Disc levels:   C2-3: Normal interspace.   C3-4: Mild uncinate spurring on the left. No spinal stenosis or significant foraminal narrowing.   C4-5: Spondylosis with loss of disc height and posterior osteophytes covering diffusely bulging disc material. Mild asymmetric uncinate spurring on the right. No central spinal stenosis. Mild-to-moderate right and mild left foraminal narrowing.   C5-6: Spondylosis with loss of disc height and posterior osteophytes covering diffusely bulging disc material. Mild uncinate spurring bilaterally. No significant central spinal stenosis. Mild to moderate foraminal narrowing bilaterally, greater on the right.   C6-7: Normal interspace.   C7-T1: Normal interspace.   IMPRESSION: 1. Cervical spondylosis with disc bulging and uncinate spurring at C4-5 and C5-6. No significant central spinal stenosis. 2. Mild-to-moderate foraminal narrowing bilaterally at C5-6, greater on the right. 3. Mild-to-moderate right and mild left foraminal narrowing at C4-5. 4. No acute findings.     Electronically Signed   By: Sammie Crigler  Redge Cancel M.D.   On: 04/15/2023 14:31  PATIENT SURVEYS:  Modified Oswestry 32% self-perceived moderate disability  and Quick Dash 43.2%  COGNITION: Overall cognitive status: Within functional limits for tasks assessed     SENSATION: WFL  POSTURE: Rounded shoulder/ slight forward head posture in sitting  UPPER EXTREMITY ROM:   Active ROM Right AROM eval  Left PROM eval  Shoulder flexion 158 deg. 111 deg. (Pain)- UT compensation  Shoulder extension    Shoulder abduction 155 deg. 74 deg. (Pain)  Shoulder adduction    Shoulder internal rotation 85 deg. 17 deg. (Pain)  Shoulder external rotation 85 deg. 71 deg.  Elbow flexion WNL WNL  Elbow extension WNL WNL  Wrist flexion    Wrist extension    Wrist ulnar deviation    Wrist radial deviation    Wrist pronation    Wrist supination    (Blank rows = not tested)  Cervical AROM: flexion 54 deg./ extension 36 deg./ L rotn. 48 deg./ R rotn. 48 deg./ L lat. Flex. 39 deg./ R lat. Flexion 31 deg. (L UT tightness)  Lumbar AROM: standing flexion 90 deg. (Straight lumbar spine).    UPPER EXTREMITY MMT:  MMT Right eval Left eval  Shoulder flexion 4 NT (pain)  Shoulder extension 4+   Shoulder abduction 4   Shoulder adduction    Shoulder internal rotation 4+   Shoulder external rotation 4   Middle trapezius    Lower trapezius    Elbow flexion 5 4+  Elbow extension 5 4+  Wrist flexion    Wrist extension    Wrist ulnar deviation    Wrist radial deviation    Wrist pronation    Wrist supination    Grip strength (lbs)    (Blank rows = not tested)  SHOULDER SPECIAL TESTS: NT due to L shoulder pain/ limitations.   JOINT MOBILITY TESTING:  L shoulder joint hypomobility as compared to R shoulder  PALPATION:  (+) tenderness in L shoulder/ deltoid musculature  4/9: L shoulder AA/PROM: flexion 114 deg./ abduction 114 deg. (Pain limited)   4/16:  reassessment of L shoulder AROM in supine (flexion 110 deg./ abduction 74 deg./ ER 27 deg./ IR 55 deg.).                                                                                                                            TREATMENT DATE: 07/05/2023  *** Subjective:  Pt. Had a rough week with a family emergency in [place].  Pt. Had to fly up last Thursday and will have to return soon.  Pt. Reports L shoulder stiffness today and hasn't been  able to do HEP due to family situation.  Pt. Reports no issues or fractures in ribs after receiving results of x-ray.  There.ex.:   Standing L shoulder A/AROM at mirror.  L shoulder joint stiffness/ pain with overhead reaching.   Seated UBE:  2 min. F/b.  Consistent cadence (slight increase c/o pain/  fatigue).  Good upright posture/ head position.    Wall ladder for L shoulder forward flexion 5x (131 deg.)/abduction 5x (124 deg.).  Increase ROM today as compared to last tx.    Standing L shoulder AROM: flexion 128 deg./ abduction 119 deg.  Improvement noted from last tx.   Standing wand ex.: chest press/ extension 20x each.  Pain limited/ fatigued with attempted sh. Flexion today.    Discussed L shoulder isometrics flexion/abduction/IR/ER x 10 with 3 second hold each direction - updated HEP   Manual tx.:  Supine L shoulder AA/PROM (all planes)- 5x each.  Pain limited/ guarded.    Seated/ supine STM to L shoulder/ UT musculature during L shoulder ROM ex.  Trigger point in L UT musculature noted.   Supine cervical stretches: L/R UT and levator stretches with static holds 3x each.    Supine L shoulder horizontal abduction/ adduction 5x with holds.  (+) L shoulder impingement with L shoulder adduction.    NOT TODAY Standing row with RTB 2 x 10    Standing shoulder extension RTB 2 x 10  Standing shoulder ER ("W") RTB 2 x 10    PATIENT EDUCATION: Education details: See HEP Person educated: Patient Education method: Explanation, Demonstration, and Handouts Education comprehension: verbalized understanding and returned demonstration  HOME EXERCISE PROGRAM: Access Code: [email or web address] URL: [email or web address] Date: 05/30/2023 Prepared by: Hazeline Lister  Exercises - Circular Shoulder Pendulum with Table Support  - 2 x daily - 7 x weekly - 2 sets - 10 reps - Seated Shoulder Flexion AAROM with Pulley Behind  - 2 x daily - 7 x weekly - 2 sets - 10 reps - Seated Shoulder  Scaption AAROM with Pulley at Side  - 2 x daily - 7 x weekly - 2 sets - 10 reps - Seated Shoulder Abduction AAROM with Pulley Behind  - 2 x daily - 7 x weekly - 2 sets - 10 reps  Access Code: [email or web address] URL: [email or web address] Date: 06/01/2023 Prepared by: Hazeline Lister  Exercises - Supine Lower Trunk Rotation  - 1 x daily - 5 x weekly - 1 sets - 10 reps - Supine Figure 4 Piriformis Stretch  - 1 x daily - 5 x weekly - 1 sets - 10 reps - Supine Posterior Pelvic Tilt  - 1 x daily - 5 x weekly - 1 sets - 10 reps - Supine March  - 1 x daily - 5 x weekly - 1 sets - 10 reps - Supine Shoulder Press AAROM in Abduction with Dowel  - 1 x daily - 5 x weekly - 1 sets - 10 reps - Supine Shoulder Flexion AAROM with Dowel  - 1 x daily - 5 x weekly - 1 sets - 10 reps - Single Arm Doorway Pec Stretch at 60 Elevation  - 1 x daily - 5 x weekly - 1 sets - 5 reps  Access Code: [email or web address] URL: [email or web address] Date: 06/15/2023 Prepared by: Janine Melbourne  Exercises - Standing Isometric Shoulder Flexion with Doorway - Arm Bent  - 1 x daily - 5 x weekly - 2 sets - 10 reps - Standing Isometric Shoulder Abduction with Doorway - Arm Bent  - 1 x daily - 5 x weekly - 2 sets - 10 reps - Standing Isometric Shoulder Internal Rotation at Doorway  - 1 x daily - 5 x weekly - 2 sets - 10 reps - Standing Isometric Shoulder External Rotation with Doorway  - 1 x daily - 5 x weekly -  2 sets - 10 reps  ASSESSMENT:  CLINICAL IMPRESSION:  ***  Pt. Demonstrates slow but consistent progress with L shoulder ROM ex. Pain limited in L shoulder/ ribs/ mid. Back during standing ex.  Pt. Fatigued after tx. Session and instructed to remain compliant with consistent ex. Program.  No change to HEP at this time.  Pt. Will benefit from skilled PT services to increase L shoulder ROM/ strength to improve pain-free mobility.     OBJECTIVE IMPAIRMENTS: decreased activity tolerance, decreased mobility, decreased  ROM, decreased strength, hypomobility, impaired flexibility, impaired UE functional use, improper body mechanics, postural dysfunction, and pain.   ACTIVITY LIMITATIONS: carrying, lifting, standing, reach over head, and locomotion level  PARTICIPATION LIMITATIONS: cleaning, laundry, community activity, and occupation  PERSONAL FACTORS: Fitness and Past/current experiences are also affecting patient's functional outcome.   REHAB POTENTIAL: Good  CLINICAL DECISION MAKING: Evolving/moderate complexity  EVALUATION COMPLEXITY: Moderate   GOALS: Goals reviewed with patient? Yes  SHORT TERM GOALS: Target date: 06/20/23  Pt. Independent with HEP to increase L shoulder PROM to [facility] as compared to R shoulder to progress to more AROM/ strengthening ex.  Baseline:  see above Goal status: Not met  2.  Pt. Will increase cervical R lat. Flexion to [facility] as compared to L with no c/o L UT muscle pain to improve posture.   Baseline:  see above Goal status: Partially met  LONG TERM GOALS: Target date: 07/25/23  Pt. Will decrease QuickDASH to <20% to improve pain-free UE mobility/ overhead reaching tasks.  Baseline: 43.2% Goal status: INITIAL  2.  Pt. Will decrease MODI to <20% to improve pain-free functional mobility with daily tasks/ body mechanics.   Baseline: 32% self-perceived moderate disability   Goal status: INITIAL  3.  Pt. Will report no neck pain/ headaches consistently for a week to improve ability to work.   Baseline: chronic h/o neck pain/ HA Goal status: INITIAL  PLAN:  PT FREQUENCY: 2x/week  PT DURATION: 8 weeks  PLANNED INTERVENTIONS: 97110-Therapeutic exercises, 97530- Therapeutic activity, [id number]- Neuromuscular re-education, 97535- Self Care, [id number]- Manual therapy, [id number]- Electrical stimulation (unattended), Patient/Family education, Joint mobilization, Cryotherapy, and Moist heat  PLAN FOR NEXT SESSION:  Reassess L shoulder A/PROM in seated posture after manual tx.   Resisted there.ex. (RTB).    Janine Melbourne, PT, DPT  Physical Therapist - [facility]  07/05/2023, 3:36 PM

## 2023-07-06 ENCOUNTER — Ambulatory Visit: Payer: Self-pay | Admitting: Physical Therapy

## 2023-07-06 DIAGNOSIS — G8929 Other chronic pain: Secondary | ICD-10-CM

## 2023-07-06 DIAGNOSIS — M25612 Stiffness of left shoulder, not elsewhere classified: Secondary | ICD-10-CM

## 2023-07-06 DIAGNOSIS — M6281 Muscle weakness (generalized): Secondary | ICD-10-CM

## 2023-07-11 ENCOUNTER — Encounter: Payer: Self-pay | Admitting: Physical Therapy

## 2023-07-11 ENCOUNTER — Ambulatory Visit: Payer: Self-pay | Admitting: Physical Therapy

## 2023-07-11 DIAGNOSIS — M25612 Stiffness of left shoulder, not elsewhere classified: Secondary | ICD-10-CM

## 2023-07-11 DIAGNOSIS — G8929 Other chronic pain: Secondary | ICD-10-CM

## 2023-07-11 DIAGNOSIS — M5459 Other low back pain: Secondary | ICD-10-CM

## 2023-07-11 DIAGNOSIS — M542 Cervicalgia: Secondary | ICD-10-CM

## 2023-07-11 DIAGNOSIS — M6281 Muscle weakness (generalized): Secondary | ICD-10-CM

## 2023-07-11 NOTE — Therapy (Signed)
 OUTPATIENT PHYSICAL THERAPY SHOULDER/ BACK TREATMENT  Patient Name: Robin Schwartz MRN: [id number] DOB:Apr 29, 1970, 53 y.o., female Today's Date: 07/11/2023  END OF SESSION:  PT End of Session - 07/11/23 0821     Visit Number 9    Number of Visits 16    Date for PT Re-Evaluation 07/25/23    PT Start Time 0821    PT Stop Time 0903    PT Time Calculation (min) 42 min            Past Medical History:  Diagnosis Date   Allergy    Anemia    Anxiety    Arthritis    Emphysema of lung (HCC)    Migraine    Neuromuscular disorder (HCC)    Past Surgical History:  Procedure Laterality Date   BREAST EXCISIONAL BIOPSY Left 1992   BREAST SURGERY  10/23/1989   Remove benign cyst   CYSTOSCOPY  2003   LIPOMA EXCISION Left 2007   shoulder   Patient Active Problem List   Diagnosis Date Noted   Hematochezia 06/08/2023   Biceps tendinitis, left [M75.22] 03/15/2023   Prolonged menstruation 03/09/2023   Trigger thumb, right thumb 02/17/2023   Lightheadedness 02/11/2023   Rotator cuff impingement syndrome of left shoulder 09/10/2022   Greater trochanteric pain syndrome of left lower extremity 09/10/2022   Neuropathy 04/12/2022   Angioedema 04/12/2022   Migraine headache 04/12/2022   Numbness and tingling of both lower extremities 07/17/2021   Postural dizziness with presyncope 07/17/2021   Weakness of both lower extremities 05/15/2021   New daily persistent headache 05/15/2021   Trigeminal neuralgia 03/11/2021   Nocturnal foot cramps 02/04/2021   Panic disorder 10/20/2020   BMI 20.0-20.9, adult 10/02/2020   Chronic fatigue 10/02/2020   History of COVID-19 09/11/2020   Bilateral primary osteoarthritis of knee 09/09/2020   Weight loss, unintentional 09/09/2020   Cervical spondylosis 09/01/2020   Anemia 08/19/2020   Cervical paraspinous muscle spasm 08/19/2020   Thickened endometrium 08/14/2020   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   Iron deficiency anemia 05/02/2020   Heart  palpitations 03/06/2020   Hypersomnia 06/11/2019   Seasonal allergies 06/07/2019   Dysmenorrhea 02/21/2019   MEE (middle ear effusion), bilateral 02/21/2019   Cervical stenosis of spine 06/08/2018   Hand joint pain 06/08/2018   Positive antinuclear antibody 06/08/2018   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   Chronic abdominal pain 10/10/2014   Headache disorder 12/12/2013   Patellofemoral dysfunction 11/15/2013   Stress incontinence 08/10/2013   Thoracolumbar back pain 08/09/2013   Bursitis of both hips 08/09/2013   Other bursitis of hip, unspecified hip 08/09/2013   Personal history of other endocrine, nutritional and metabolic disease 59/56/3875   Iliotibial band syndrome 05/31/2013   Irritable bowel syndrome with constipation 04/27/2013   Gastroesophageal reflux disease 04/04/2013   Eustachian tube dysfunction 11/29/2012   Lesion of sciatic nerve 09/27/2012   Piriformis syndrome 09/27/2012   Generalized anxiety disorder 03/28/2012   Skin sensation disturbance 06/01/2011   Disequilibrium 06/01/2011    PCP: Mimi Alt, MD  REFERRING PROVIDER: Ma Saupe, MD  REFERRING DIAG:  Diagnosis  M54.50,M54.6 (ICD-10-CM) - Thoracolumbar back pain  M47.812 (ICD-10-CM) - Cervical spondylosis  M19.012 (ICD-10-CM) - Osteoarthritis of left shoulder, unspecified osteoarthritis type   THERAPY DIAG:  Chronic left shoulder pain  Shoulder joint stiffness, left  Muscle weakness (generalized)  Other low back pain  Neck pain  Rationale for Evaluation and Treatment: Rehabilitation  ONSET DATE: Chronic    SUBJECTIVE:  SUBJECTIVE STATEMENT: Pt. Known to PT clinic.  Pt. Referred to PT with c/o L shoulder pain/limitations and chronic low back/ neck pain/ headaches.  Pts. Primary goal to is  to decrease pain symptoms.  Pt. States her migraines and dizziness limits ability to work.  Pt. States she was pulling on washing machine and injured L shoulder.  Pt. Had cortisone injection with no benefit and PT in [place] for strengthening with marked increase in pain.  See MRI results.   Hand dominance: Right  PERTINENT HISTORY: See MD notes.    PAIN:  Are you having pain? Yes: NPRS scale: 5/10 L shoulder, 6/10 low back  Pain location: sh./neck/back Pain description: persistent/ sharp pain in L shoulder with mvmt.   Aggravating factors: movement Relieving factors: rest  PRECAUTIONS: None  RED FLAGS: None   WEIGHT BEARING RESTRICTIONS: No  FALLS:  Has patient fallen in last 6 months? No  LIVING ENVIRONMENT: Lives with: lives alone Lives in: House/apartment Has following equipment at home: None  OCCUPATION: Real Estate  PLOF: Independent  PATIENT GOALS: Increase L shoulder ROM/ strength.  Decrease pain  NEXT MD VISIT:   OBJECTIVE:  Note: Objective measures were completed at Evaluation unless otherwise noted.  DIAGNOSTIC FINDINGS:  FINDINGS: Rotator cuff: Mild supraspinatus tendinosis. Infraspinatus tendon is intact. Teres minor tendon is intact. Subscapularis tendon is intact.   Muscles: No muscle atrophy or edema. No intramuscular fluid collection or hematoma.   Biceps Long Head: Intraarticular and extraarticular portions of the biceps tendon are intact.   Acromioclavicular Joint: No significant arthropathy of the acromioclavicular joint. No subacromial/subdeltoid bursal fluid.   Glenohumeral Joint: No joint effusion. Mild partial-thickness cartilage loss of the glenohumeral joint.   Labrum: Grossly intact, but evaluation is limited by lack of intraarticular fluid/contrast.   Bones: No fracture or dislocation. No aggressive osseous lesion.   Other: No fluid collection or hematoma.   IMPRESSION: 1. Mild supraspinatus tendinosis. 2. Mild  partial-thickness cartilage loss of the glenohumeral joint.     Electronically Signed   By: Onnie Bilis M.D.   On: 04/15/2023 13:30  EXAM: MRI CERVICAL SPINE WITHOUT CONTRAST   TECHNIQUE: Multiplanar, multisequence MR imaging of the cervical spine was performed. No intravenous contrast was administered.   COMPARISON:  Cervical spine radiographs 08/19/2020   FINDINGS: Alignment: Physiologic.   Vertebrae: No acute or suspicious osseous findings.   Cord: Normal in signal and caliber.   Posterior Fossa, vertebral arteries, paraspinal tissues: Visualized portions of the posterior fossa appear unremarkable.Bilateral vertebral artery flow voids. No significant paraspinal findings.   Disc levels:   C2-3: Normal interspace.   C3-4: Mild uncinate spurring on the left. No spinal stenosis or significant foraminal narrowing.   C4-5: Spondylosis with loss of disc height and posterior osteophytes covering diffusely bulging disc material. Mild asymmetric uncinate spurring on the right. No central spinal stenosis. Mild-to-moderate right and mild left foraminal narrowing.   C5-6: Spondylosis with loss of disc height and posterior osteophytes covering diffusely bulging disc material. Mild uncinate spurring bilaterally. No significant central spinal stenosis. Mild to moderate foraminal narrowing bilaterally, greater on the right.   C6-7: Normal interspace.   C7-T1: Normal interspace.   IMPRESSION: 1. Cervical spondylosis with disc bulging and uncinate spurring at C4-5 and C5-6. No significant central spinal stenosis. 2. Mild-to-moderate foraminal narrowing bilaterally at C5-6, greater on the right. 3. Mild-to-moderate right and mild left foraminal narrowing at C4-5. 4. No acute findings.     Electronically Signed   By: Sammie Crigler  Redge Cancel M.D.   On: 04/15/2023 14:31  PATIENT SURVEYS:  Modified Oswestry 32% self-perceived moderate disability  and Quick Dash  43.2%  COGNITION: Overall cognitive status: Within functional limits for tasks assessed     SENSATION: WFL  POSTURE: Rounded shoulder/ slight forward head posture in sitting  UPPER EXTREMITY ROM:   Active ROM Right AROM eval Left PROM eval  Shoulder flexion 158 deg. 111 deg. (Pain)- UT compensation  Shoulder extension    Shoulder abduction 155 deg. 74 deg. (Pain)  Shoulder adduction    Shoulder internal rotation 85 deg. 17 deg. (Pain)  Shoulder external rotation 85 deg. 71 deg.  Elbow flexion WNL WNL  Elbow extension WNL WNL  Wrist flexion    Wrist extension    Wrist ulnar deviation    Wrist radial deviation    Wrist pronation    Wrist supination    (Blank rows = not tested)  Cervical AROM: flexion 54 deg./ extension 36 deg./ L rotn. 48 deg./ R rotn. 48 deg./ L lat. Flex. 39 deg./ R lat. Flexion 31 deg. (L UT tightness)  Lumbar AROM: standing flexion 90 deg. (Straight lumbar spine).    UPPER EXTREMITY MMT:  MMT Right eval Left eval  Shoulder flexion 4 NT (pain)  Shoulder extension 4+   Shoulder abduction 4   Shoulder adduction    Shoulder internal rotation 4+   Shoulder external rotation 4   Middle trapezius    Lower trapezius    Elbow flexion 5 4+  Elbow extension 5 4+  Wrist flexion    Wrist extension    Wrist ulnar deviation    Wrist radial deviation    Wrist pronation    Wrist supination    Grip strength (lbs)    (Blank rows = not tested)  SHOULDER SPECIAL TESTS: NT due to L shoulder pain/ limitations.   JOINT MOBILITY TESTING:  L shoulder joint hypomobility as compared to R shoulder  PALPATION:  (+) tenderness in L shoulder/ deltoid musculature  4/9: L shoulder AA/PROM: flexion 114 deg./ abduction 114 deg. (Pain limited)   4/16:  reassessment of L shoulder AROM in supine (flexion 110 deg./ abduction 74 deg./ ER 27 deg./ IR 55 deg.).   Wall ladder for L shoulder forward flexion 5x (131 deg.)/abduction 5x (124 deg.).  Increase ROM today as  compared to last tx.                                                                                                                             TREATMENT DATE: 07/11/2023   Subjective:  Pt. Reports shoulder has been doing "okay" but limited with overhead reaching position for hair management.    There.ex.:   Standing L shoulder A/AROM at mirror (all planes).  L shoulder joint stiffness/ pain with overhead reaching.   Seated UBE:  2 min. F/b.  Consistent cadence/  good upright posture/ head position.    Standing wand ex.: sh. Flexion/ chest press/  extension/ IR 20x each.    Standing L shoulder AROM: flexion 132 deg./ abduction 128 deg.  Improvement noted from last tx.   Standing GTB shoulder ex.: scap. Retraction/ sh. Extension/ horizontal flexion/ IR/ ER 20x each.    Discussed L shoulder isometrics flexion/abduction/IR/ER x 10 with 3 second hold each direction - reviewed HEP   Manual tx.:  Supine L shoulder AA/PROM (all planes)- 5x each.  Pain limited/ guarded.    Seated/ supine STM to L shoulder/ UT musculature during L shoulder ROM ex.  Trigger point in L UT musculature noted.   Supine cervical stretches: L/R UT and levator stretches with static holds 3x each.    Supine L shoulder horizontal abduction/ adduction 5x with holds.  (+) L shoulder impingement with L shoulder adduction.     NOT TODAY Standing row with RTB 2 x 10    Standing shoulder extension RTB 2 x 10  Standing shoulder ER ("W") RTB 2 x 10    PATIENT EDUCATION: Education details: See HEP Person educated: Patient Education method: Explanation, Demonstration, and Handouts Education comprehension: verbalized understanding and returned demonstration  HOME EXERCISE PROGRAM: Access Code: [email or web address] URL: [email or web address] Date: 05/30/2023 Prepared by: Hazeline Lister  Exercises - Circular Shoulder Pendulum with Table Support  - 2 x daily - 7 x weekly - 2 sets - 10 reps - Seated Shoulder Flexion  AAROM with Pulley Behind  - 2 x daily - 7 x weekly - 2 sets - 10 reps - Seated Shoulder Scaption AAROM with Pulley at Side  - 2 x daily - 7 x weekly - 2 sets - 10 reps - Seated Shoulder Abduction AAROM with Pulley Behind  - 2 x daily - 7 x weekly - 2 sets - 10 reps  Access Code: [email or web address] URL: [email or web address] Date: 06/01/2023 Prepared by: Hazeline Lister  Exercises - Supine Lower Trunk Rotation  - 1 x daily - 5 x weekly - 1 sets - 10 reps - Supine Figure 4 Piriformis Stretch  - 1 x daily - 5 x weekly - 1 sets - 10 reps - Supine Posterior Pelvic Tilt  - 1 x daily - 5 x weekly - 1 sets - 10 reps - Supine March  - 1 x daily - 5 x weekly - 1 sets - 10 reps - Supine Shoulder Press AAROM in Abduction with Dowel  - 1 x daily - 5 x weekly - 1 sets - 10 reps - Supine Shoulder Flexion AAROM with Dowel  - 1 x daily - 5 x weekly - 1 sets - 10 reps - Single Arm Doorway Pec Stretch at 60 Elevation  - 1 x daily - 5 x weekly - 1 sets - 5 reps  Access Code: [email or web address] URL: [email or web address] Date: 06/15/2023 Prepared by: Janine Melbourne  Exercises - Standing Isometric Shoulder Flexion with Doorway - Arm Bent  - 1 x daily - 5 x weekly - 2 sets - 10 reps - Standing Isometric Shoulder Abduction with Doorway - Arm Bent  - 1 x daily - 5 x weekly - 2 sets - 10 reps - Standing Isometric Shoulder Internal Rotation at Doorway  - 1 x daily - 5 x weekly - 2 sets - 10 reps - Standing Isometric Shoulder External Rotation with Doorway  - 1 x daily - 5 x weekly - 2 sets - 10 reps  ASSESSMENT:  CLINICAL IMPRESSION:   Pt. Demonstrates slow but consistent progress with L shoulder ROM ex. Pain limited in L shoulder/ ribs/ mid.  Back during standing ex.  Pt. Fatigued after tx. Session and instructed to remain compliant with consistent ex. Program. (+) L shoulder impingement with L shoulder horizontal adduction/ abduction.  Pt. Will benefit from skilled PT services to increase L shoulder ROM/  strength to improve pain-free mobility.     OBJECTIVE IMPAIRMENTS: decreased activity tolerance, decreased mobility, decreased ROM, decreased strength, hypomobility, impaired flexibility, impaired UE functional use, improper body mechanics, postural dysfunction, and pain.   ACTIVITY LIMITATIONS: carrying, lifting, standing, reach over head, and locomotion level  PARTICIPATION LIMITATIONS: cleaning, laundry, community activity, and occupation  PERSONAL FACTORS: Fitness and Past/current experiences are also affecting patient's functional outcome.   REHAB POTENTIAL: Good  CLINICAL DECISION MAKING: Evolving/moderate complexity  EVALUATION COMPLEXITY: Moderate   GOALS: Goals reviewed with patient? Yes  SHORT TERM GOALS: Target date: 06/20/23  Pt. Independent with HEP to increase L shoulder PROM to [facility] as compared to R shoulder to progress to more AROM/ strengthening ex.  Baseline:  see above Goal status: Not met  2.  Pt. Will increase cervical R lat. Flexion to [facility] as compared to L with no c/o L UT muscle pain to improve posture.   Baseline:  see above Goal status: Partially met  LONG TERM GOALS: Target date: 07/25/23  Pt. Will decrease QuickDASH to <20% to improve pain-free UE mobility/ overhead reaching tasks.  Baseline: 43.2% Goal status: INITIAL  2.  Pt. Will decrease MODI to <20% to improve pain-free functional mobility with daily tasks/ body mechanics.   Baseline: 32% self-perceived moderate disability   Goal status: INITIAL  3.  Pt. Will report no neck pain/ headaches consistently for a week to improve ability to work.   Baseline: chronic h/o neck pain/ HA Goal status: INITIAL  PLAN:  PT FREQUENCY: 2x/week  PT DURATION: 8 weeks  PLANNED INTERVENTIONS: 97110-Therapeutic exercises, 97530- Therapeutic activity, [id number]- Neuromuscular re-education, 97535- Self Care, [id number]- Manual therapy, [id number]- Electrical stimulation (unattended), Patient/Family education, Joint  mobilization, Cryotherapy, and Moist heat  PLAN FOR NEXT SESSION:  Reassess L shoulder A/PROM in seated posture after manual tx.  Resisted there.ex. (RTB).  Reassess QuickDASH.   Lendell Quarry, PT, DPT # [phone] Physical Therapist - [facility]  07/11/2023, 1:07 PM

## 2023-07-13 ENCOUNTER — Ambulatory Visit: Payer: Self-pay | Admitting: Physical Therapy

## 2023-07-19 ENCOUNTER — Ambulatory Visit: Payer: Self-pay | Admitting: Physical Therapy

## 2023-07-19 ENCOUNTER — Encounter: Payer: Self-pay | Admitting: Oncology

## 2023-07-21 ENCOUNTER — Ambulatory Visit: Payer: Self-pay | Admitting: Physical Therapy

## 2023-07-21 ENCOUNTER — Encounter: Payer: Self-pay | Admitting: Physical Therapy

## 2023-07-21 DIAGNOSIS — M25612 Stiffness of left shoulder, not elsewhere classified: Secondary | ICD-10-CM

## 2023-07-21 DIAGNOSIS — M6281 Muscle weakness (generalized): Secondary | ICD-10-CM

## 2023-07-21 DIAGNOSIS — G8929 Other chronic pain: Secondary | ICD-10-CM

## 2023-07-21 NOTE — Therapy (Signed)
 OUTPATIENT PHYSICAL THERAPY SHOULDER/ BACK TREATMENT Physical Therapy Progress Note  Dates of reporting period  05/30/23   to   07/21/23   Patient Name: Robin Schwartz MRN: [id number] DOB:[birth year], 53 y.o., female Today's Date: 07/21/2023  END OF SESSION:  PT End of Session - 07/21/23 0823     Visit Number 10    Number of Visits 16    Date for PT Re-Evaluation 07/25/23    PT Start Time 0821    PT Stop Time 0905    PT Time Calculation (min) 44 min            Past Medical History:  Diagnosis Date   Allergy    Anemia    Anxiety    Arthritis    Emphysema of lung (HCC)    Migraine    Neuromuscular disorder (HCC)    Past Surgical History:  Procedure Laterality Date   BREAST EXCISIONAL BIOPSY Left 1992   BREAST SURGERY  10/23/1989   Remove benign cyst   CYSTOSCOPY  2003   LIPOMA EXCISION Left 2007   shoulder   Patient Active Problem List   Diagnosis Date Noted   Hematochezia 06/08/2023   Biceps tendinitis, left [M75.22] 03/15/2023   Prolonged menstruation 03/09/2023   Trigger thumb, right thumb 02/17/2023   Lightheadedness 02/11/2023   Rotator cuff impingement syndrome of left shoulder 09/10/2022   Greater trochanteric pain syndrome of left lower extremity 09/10/2022   Neuropathy 04/12/2022   Angioedema 04/12/2022   Migraine headache 04/12/2022   Numbness and tingling of both lower extremities 07/17/2021   Postural dizziness with presyncope 07/17/2021   Weakness of both lower extremities 05/15/2021   New daily persistent headache 05/15/2021   Trigeminal neuralgia 03/11/2021   Nocturnal foot cramps 02/04/2021   Panic disorder 10/20/2020   BMI 20.0-20.9, adult 10/02/2020   Chronic fatigue 10/02/2020   History of COVID-19 09/11/2020   Bilateral primary osteoarthritis of knee 09/09/2020   Weight loss, unintentional 09/09/2020   Cervical spondylosis 09/01/2020   Anemia 08/19/2020   Cervical paraspinous muscle spasm 08/19/2020   Thickened endometrium 08/14/2020    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   Iron deficiency anemia 05/02/2020   Heart palpitations 03/06/2020   Hypersomnia 06/11/2019   Seasonal allergies 06/07/2019   Dysmenorrhea 02/21/2019   MEE (middle ear effusion), bilateral 02/21/2019   Cervical stenosis of spine 06/08/2018   Hand joint pain 06/08/2018   Positive antinuclear antibody 06/08/2018   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   Chronic abdominal pain 10/10/2014   Headache disorder 12/12/2013   Patellofemoral dysfunction 11/15/2013   Stress incontinence 08/10/2013   Thoracolumbar back pain 08/09/2013   Bursitis of both hips 08/09/2013   Other bursitis of hip, unspecified hip 08/09/2013   Personal history of other endocrine, nutritional and metabolic disease 72/53/6644   Iliotibial band syndrome 05/31/2013   Irritable bowel syndrome with constipation 04/27/2013   Gastroesophageal reflux disease 04/04/2013   Eustachian tube dysfunction 11/29/2012   Lesion of sciatic nerve 09/27/2012   Piriformis syndrome 09/27/2012   Generalized anxiety disorder 03/28/2012   Skin sensation disturbance 06/01/2011   Disequilibrium 06/01/2011    PCP: Mimi Alt, MD  REFERRING PROVIDER: Ma Saupe, MD  REFERRING DIAG:  Diagnosis  M54.50,M54.6 (ICD-10-CM) - Thoracolumbar back pain  M47.812 (ICD-10-CM) - Cervical spondylosis  M19.012 (ICD-10-CM) - Osteoarthritis of left shoulder, unspecified osteoarthritis type   THERAPY DIAG:  Chronic left shoulder pain  Shoulder joint stiffness, left  Muscle weakness (generalized)  Other low back pain  Neck pain  Rationale for Evaluation and Treatment: Rehabilitation  ONSET DATE: Chronic    SUBJECTIVE:                                                                                                                                                                                      SUBJECTIVE STATEMENT: Pt. Known to PT clinic.  Pt. Referred to PT with c/o L shoulder  pain/limitations and chronic low back/ neck pain/ headaches.  Pts. Primary goal to is to decrease pain symptoms.  Pt. States her migraines and dizziness limits ability to work.  Pt. States she was pulling on washing machine and injured L shoulder.  Pt. Had cortisone injection with no benefit and PT in [place] for strengthening with marked increase in pain.  See MRI results.   Hand dominance: Right  PERTINENT HISTORY: See MD notes.    PAIN:  Are you having pain? Yes: NPRS scale: 5/10 L shoulder, 6/10 low back  Pain location: sh./neck/back Pain description: persistent/ sharp pain in L shoulder with mvmt.   Aggravating factors: movement Relieving factors: rest  PRECAUTIONS: None  RED FLAGS: None   WEIGHT BEARING RESTRICTIONS: No  FALLS:  Has patient fallen in last 6 months? No  LIVING ENVIRONMENT: Lives with: lives alone Lives in: House/apartment Has following equipment at home: None  OCCUPATION: Real Estate  PLOF: Independent  PATIENT GOALS: Increase L shoulder ROM/ strength.  Decrease pain  NEXT MD VISIT:   OBJECTIVE:  Note: Objective measures were completed at Evaluation unless otherwise noted.  DIAGNOSTIC FINDINGS:  FINDINGS: Rotator cuff: Mild supraspinatus tendinosis. Infraspinatus tendon is intact. Teres minor tendon is intact. Subscapularis tendon is intact.   Muscles: No muscle atrophy or edema. No intramuscular fluid collection or hematoma.   Biceps Long Head: Intraarticular and extraarticular portions of the biceps tendon are intact.   Acromioclavicular Joint: No significant arthropathy of the acromioclavicular joint. No subacromial/subdeltoid bursal fluid.   Glenohumeral Joint: No joint effusion. Mild partial-thickness cartilage loss of the glenohumeral joint.   Labrum: Grossly intact, but evaluation is limited by lack of intraarticular fluid/contrast.   Bones: No fracture or dislocation. No aggressive osseous lesion.   Other: No fluid collection  or hematoma.   IMPRESSION: 1. Mild supraspinatus tendinosis. 2. Mild partial-thickness cartilage loss of the glenohumeral joint.     Electronically Signed   By: Onnie Bilis M.D.   On: 04/15/2023 13:30  EXAM: MRI CERVICAL SPINE WITHOUT CONTRAST   TECHNIQUE: Multiplanar, multisequence MR imaging of the cervical spine was performed. No intravenous contrast was administered.   COMPARISON:  Cervical spine radiographs 08/19/2020   FINDINGS: Alignment: Physiologic.   Vertebrae: No acute or suspicious osseous  findings.   Cord: Normal in signal and caliber.   Posterior Fossa, vertebral arteries, paraspinal tissues: Visualized portions of the posterior fossa appear unremarkable.Bilateral vertebral artery flow voids. No significant paraspinal findings.   Disc levels:   C2-3: Normal interspace.   C3-4: Mild uncinate spurring on the left. No spinal stenosis or significant foraminal narrowing.   C4-5: Spondylosis with loss of disc height and posterior osteophytes covering diffusely bulging disc material. Mild asymmetric uncinate spurring on the right. No central spinal stenosis. Mild-to-moderate right and mild left foraminal narrowing.   C5-6: Spondylosis with loss of disc height and posterior osteophytes covering diffusely bulging disc material. Mild uncinate spurring bilaterally. No significant central spinal stenosis. Mild to moderate foraminal narrowing bilaterally, greater on the right.   C6-7: Normal interspace.   C7-T1: Normal interspace.   IMPRESSION: 1. Cervical spondylosis with disc bulging and uncinate spurring at C4-5 and C5-6. No significant central spinal stenosis. 2. Mild-to-moderate foraminal narrowing bilaterally at C5-6, greater on the right. 3. Mild-to-moderate right and mild left foraminal narrowing at C4-5. 4. No acute findings.     Electronically Signed   By: Elmon Hagedorn M.D.   On: 04/15/2023 14:31  PATIENT SURVEYS:  Modified Oswestry 32%  self-perceived moderate disability  and Quick Dash 43.2%  COGNITION: Overall cognitive status: Within functional limits for tasks assessed     SENSATION: WFL  POSTURE: Rounded shoulder/ slight forward head posture in sitting  UPPER EXTREMITY ROM:   Active ROM Right AROM eval Left PROM eval  Shoulder flexion 158 deg. 111 deg. (Pain)- UT compensation  Shoulder extension    Shoulder abduction 155 deg. 74 deg. (Pain)  Shoulder adduction    Shoulder internal rotation 85 deg. 17 deg. (Pain)  Shoulder external rotation 85 deg. 71 deg.  Elbow flexion WNL WNL  Elbow extension WNL WNL  Wrist flexion    Wrist extension    Wrist ulnar deviation    Wrist radial deviation    Wrist pronation    Wrist supination    (Blank rows = not tested)  Cervical AROM: flexion 54 deg./ extension 36 deg./ L rotn. 48 deg./ R rotn. 48 deg./ L lat. Flex. 39 deg./ R lat. Flexion 31 deg. (L UT tightness)  Lumbar AROM: standing flexion 90 deg. (Straight lumbar spine).    UPPER EXTREMITY MMT:  MMT Right eval Left eval  Shoulder flexion 4 NT (pain)  Shoulder extension 4+   Shoulder abduction 4   Shoulder adduction    Shoulder internal rotation 4+   Shoulder external rotation 4   Middle trapezius    Lower trapezius    Elbow flexion 5 4+  Elbow extension 5 4+  Wrist flexion    Wrist extension    Wrist ulnar deviation    Wrist radial deviation    Wrist pronation    Wrist supination    Grip strength (lbs)    (Blank rows = not tested)  SHOULDER SPECIAL TESTS: NT due to L shoulder pain/ limitations.   JOINT MOBILITY TESTING:  L shoulder joint hypomobility as compared to R shoulder  PALPATION:  (+) tenderness in L shoulder/ deltoid musculature  4/9: L shoulder AA/PROM: flexion 114 deg./ abduction 114 deg. (Pain limited)   4/16:  reassessment of L shoulder AROM in supine (flexion 110 deg./ abduction 74 deg./ ER 27 deg./ IR 55 deg.).   Wall ladder for L shoulder forward flexion 5x (131  deg.)/abduction 5x (124 deg.).  Increase ROM today as compared to last tx.  Standing L shoulder AROM: flexion 132 deg./ abduction 128 deg.  Improvement noted from last tx.                                                                                                                            TREATMENT DATE: 07/21/2023   Subjective:  Pt. Reports significant increase in back pain/ soreness/ "crushing" lung feeling after last PT tx. Session.  Pt. Had f/u with Rheumatology (see notes).  Pt. is continuing to do stretching but fearful of strengthening secondary to pain.  Pt. Reports 3/10 in back (generalized) and 3/10 L shoulder pain with movement.   Pt. describes numerous pain symptoms/ cramping in hands.    There.ex.:   Standing lumbar AROM reassessment (pain limited with end-range lateral flexion to L/R and extension).    Standing/ supine L shoulder A/AROM (all planes).  Flexion (134 deg.), horizontal abduction (pain limited)- 90 deg.   Standing shoulder flexion at wall (142 deg.)- L UT compensation noted as compared to R.    Seated scap. retraction with no resistance today (isometric holds).  Increase pain reported radiating down the spine.  Good upright posture.  Manual tx.:  Supine L shoulder AA/PROM (all planes)- 5x each.  Pain limited/ guarded.    Seated/ supine STM to L shoulder/ UT musculature during L shoulder ROM ex.    Supine cervical stretches: L/R UT and levator stretches with static holds 3x each.    Supine L shoulder horizontal abduction/ adduction 5x with holds.  (+) L shoulder impingement with L shoulder adduction.     NOT TODAY Standing row with RTB 2 x 10    Standing shoulder extension RTB 2 x 10  Standing shoulder ER ("W") RTB 2 x 10    Seated UBE:  2 min. F/b.  Consistent cadence/  good upright posture/ head position.    Standing wand ex.: sh. Flexion/ chest press/ extension/ IR 20x each.    Standing GTB shoulder ex.: scap. Retraction/ sh. Extension/  horizontal flexion/ IR/ ER 20x each.    Discussed L shoulder isometrics flexion/abduction/IR/ER x 10 with 3 second hold each direction - reviewed HEP   PATIENT EDUCATION: Education details: See HEP Person educated: Patient Education method: Explanation, Demonstration, and Handouts Education comprehension: verbalized understanding and returned demonstration  HOME EXERCISE PROGRAM: Access Code: [email or web address] URL: [email or web address] Date: 05/30/2023 Prepared by: Hazeline Lister  Exercises - Circular Shoulder Pendulum with Table Support  - 2 x daily - 7 x weekly - 2 sets - 10 reps - Seated Shoulder Flexion AAROM with Pulley Behind  - 2 x daily - 7 x weekly - 2 sets - 10 reps - Seated Shoulder Scaption AAROM with Pulley at Side  - 2 x daily - 7 x weekly - 2 sets - 10 reps - Seated Shoulder Abduction AAROM with Pulley Behind  - 2 x daily - 7 x weekly - 2 sets - 10 reps  Access Code: [email or web address] URL: [email or web address] Date: 06/01/2023  Prepared by: Hazeline Lister  Exercises - Supine Lower Trunk Rotation  - 1 x daily - 5 x weekly - 1 sets - 10 reps - Supine Figure 4 Piriformis Stretch  - 1 x daily - 5 x weekly - 1 sets - 10 reps - Supine Posterior Pelvic Tilt  - 1 x daily - 5 x weekly - 1 sets - 10 reps - Supine March  - 1 x daily - 5 x weekly - 1 sets - 10 reps - Supine Shoulder Press AAROM in Abduction with Dowel  - 1 x daily - 5 x weekly - 1 sets - 10 reps - Supine Shoulder Flexion AAROM with Dowel  - 1 x daily - 5 x weekly - 1 sets - 10 reps - Single Arm Doorway Pec Stretch at 60 Elevation  - 1 x daily - 5 x weekly - 1 sets - 5 reps  Access Code: [email or web address] URL: [email or web address] Date: 06/15/2023 Prepared by: Janine Melbourne  Exercises - Standing Isometric Shoulder Flexion with Doorway - Arm Bent  - 1 x daily - 5 x weekly - 2 sets - 10 reps - Standing Isometric Shoulder Abduction with Doorway - Arm Bent  - 1 x daily - 5 x weekly - 2 sets - 10  reps - Standing Isometric Shoulder Internal Rotation at Doorway  - 1 x daily - 5 x weekly - 2 sets - 10 reps - Standing Isometric Shoulder External Rotation with Doorway  - 1 x daily - 5 x weekly - 2 sets - 10 reps  ASSESSMENT:  CLINICAL IMPRESSION:   Pt. demonstrates slow but consistent progress with L shoulder ROM ex.  Pt. Remains pain limited in L shoulder/ ribs/ mid. Back during all ex.  No resisted ex. Today with focus on L shoulder joint mobility/ ROM.   (+) L shoulder impingement with L shoulder horizontal adduction/ abduction.  See updated goals.  Pt. Will benefit from skilled PT services to increase L shoulder ROM/ strength to improve pain-free mobility.     OBJECTIVE IMPAIRMENTS: decreased activity tolerance, decreased mobility, decreased ROM, decreased strength, hypomobility, impaired flexibility, impaired UE functional use, improper body mechanics, postural dysfunction, and pain.   ACTIVITY LIMITATIONS: carrying, lifting, standing, reach over head, and locomotion level  PARTICIPATION LIMITATIONS: cleaning, laundry, community activity, and occupation  PERSONAL FACTORS: Fitness and Past/current experiences are also affecting patient's functional outcome.   REHAB POTENTIAL: Good  CLINICAL DECISION MAKING: Evolving/moderate complexity  EVALUATION COMPLEXITY: Moderate   GOALS: Goals reviewed with patient? Yes  SHORT TERM GOALS: Target date: 06/20/23  Pt. Independent with HEP to increase L shoulder PROM to [facility] as compared to R shoulder to progress to more AROM/ strengthening ex.  Baseline:  see above Goal status: Partially met  2.  Pt. Will increase cervical R lat. Flexion to [facility] as compared to L with no c/o L UT muscle pain to improve posture.   Baseline:  see above Goal status: Goal met  LONG TERM GOALS: Target date: 07/25/23  Pt. Will decrease QuickDASH to <20% to improve pain-free UE mobility/ overhead reaching tasks.  Baseline: 43.2%.  5/29: 36.4% improvement Goal  status: Partially met  2.  Pt. Will decrease MODI to <20% to improve pain-free functional mobility with daily tasks/ body mechanics.   Baseline: 32% self-perceived moderate disability.  5/29: 44% self-perceived severe disability.  Regression secondary in back pain over past couple weeks.   Goal status: Not met  3.  Pt. Will report no neck pain/ headaches  consistently for a week to improve ability to work.   Baseline: chronic h/o neck pain/ HA Goal status: Not met  PLAN:  PT FREQUENCY: 2x/week  PT DURATION: 8 weeks  PLANNED INTERVENTIONS: 97110-Therapeutic exercises, 97530- Therapeutic activity, [id number]- Neuromuscular re-education, 97535- Self Care, [id number]- Manual therapy, [id number]- Electrical stimulation (unattended), Patient/Family education, Joint mobilization, Cryotherapy, and Moist heat  PLAN FOR NEXT SESSION:  Resisted there.ex. (RTB).    Lendell Quarry, PT, DPT # [phone] Physical Therapist - [facility] 07/21/2023, 9:14 AM

## 2023-07-22 ENCOUNTER — Encounter: Payer: Self-pay | Admitting: Physical Therapy

## 2023-07-26 ENCOUNTER — Encounter: Payer: Self-pay | Admitting: Physical Therapy

## 2023-07-26 ENCOUNTER — Encounter: Payer: Self-pay | Admitting: Oncology

## 2023-07-26 ENCOUNTER — Ambulatory Visit: Payer: PRIVATE HEALTH INSURANCE | Attending: Family Medicine | Admitting: Physical Therapy

## 2023-07-26 DIAGNOSIS — G8929 Other chronic pain: Secondary | ICD-10-CM | POA: Diagnosis present

## 2023-07-26 DIAGNOSIS — M542 Cervicalgia: Secondary | ICD-10-CM | POA: Insufficient documentation

## 2023-07-26 DIAGNOSIS — M25512 Pain in left shoulder: Secondary | ICD-10-CM | POA: Insufficient documentation

## 2023-07-26 DIAGNOSIS — M5459 Other low back pain: Secondary | ICD-10-CM | POA: Diagnosis present

## 2023-07-26 DIAGNOSIS — M6281 Muscle weakness (generalized): Secondary | ICD-10-CM | POA: Diagnosis present

## 2023-07-26 DIAGNOSIS — M25612 Stiffness of left shoulder, not elsewhere classified: Secondary | ICD-10-CM | POA: Diagnosis present

## 2023-07-26 NOTE — Therapy (Signed)
 OUTPATIENT PHYSICAL THERAPY SHOULDER/ BACK TREATMENT/ RECERTIFICATION  Patient Name: Robin Schwartz MRN: [id number] DOB:1970-05-09, 53 y.o., female Today's Date: 07/26/2023  END OF SESSION:  PT End of Session - 07/26/23 1037     Visit Number 11    Number of Visits 19    Date for PT Re-Evaluation 08/23/23    PT Start Time 1035    PT Stop Time 1119    PT Time Calculation (min) 44 min            Past Medical History:  Diagnosis Date   Allergy    Anemia    Anxiety    Arthritis    Emphysema of lung (HCC)    Migraine    Neuromuscular disorder (HCC)    Past Surgical History:  Procedure Laterality Date   BREAST EXCISIONAL BIOPSY Left 1992   BREAST SURGERY  10/23/1989   Remove benign cyst   CYSTOSCOPY  2003   LIPOMA EXCISION Left 2007   shoulder   Patient Active Problem List   Diagnosis Date Noted   Hematochezia 06/08/2023   Biceps tendinitis, left [M75.22] 03/15/2023   Prolonged menstruation 03/09/2023   Trigger thumb, right thumb 02/17/2023   Lightheadedness 02/11/2023   Rotator cuff impingement syndrome of left shoulder 09/10/2022   Greater trochanteric pain syndrome of left lower extremity 09/10/2022   Neuropathy 04/12/2022   Angioedema 04/12/2022   Migraine headache 04/12/2022   Numbness and tingling of both lower extremities 07/17/2021   Postural dizziness with presyncope 07/17/2021   Weakness of both lower extremities 05/15/2021   New daily persistent headache 05/15/2021   Trigeminal neuralgia 03/11/2021   Nocturnal foot cramps 02/04/2021   Panic disorder 10/20/2020   BMI 20.0-20.9, adult 10/02/2020   Chronic fatigue 10/02/2020   History of COVID-19 09/11/2020   Bilateral primary osteoarthritis of knee 09/09/2020   Weight loss, unintentional 09/09/2020   Cervical spondylosis 09/01/2020   Anemia 08/19/2020   Cervical paraspinous muscle spasm 08/19/2020   Thickened endometrium 08/14/2020   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   Iron deficiency anemia  05/02/2020   Heart palpitations 03/06/2020   Hypersomnia 06/11/2019   Seasonal allergies 06/07/2019   Dysmenorrhea 02/21/2019   MEE (middle ear effusion), bilateral 02/21/2019   Cervical stenosis of spine 06/08/2018   Hand joint pain 06/08/2018   Positive antinuclear antibody 06/08/2018   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   Chronic abdominal pain 10/10/2014   Headache disorder 12/12/2013   Patellofemoral dysfunction 11/15/2013   Stress incontinence 08/10/2013   Thoracolumbar back pain 08/09/2013   Bursitis of both hips 08/09/2013   Other bursitis of hip, unspecified hip 08/09/2013   Personal history of other endocrine, nutritional and metabolic disease 40/98/1191   Iliotibial band syndrome 05/31/2013   Irritable bowel syndrome with constipation 04/27/2013   Gastroesophageal reflux disease 04/04/2013   Eustachian tube dysfunction 11/29/2012   Lesion of sciatic nerve 09/27/2012   Piriformis syndrome 09/27/2012   Generalized anxiety disorder 03/28/2012   Skin sensation disturbance 06/01/2011   Disequilibrium 06/01/2011    PCP: Mimi Alt, MD  REFERRING PROVIDER: Ma Saupe, MD  REFERRING DIAG:  Diagnosis  M54.50,M54.6 (ICD-10-CM) - Thoracolumbar back pain  M47.812 (ICD-10-CM) - Cervical spondylosis  M19.012 (ICD-10-CM) - Osteoarthritis of left shoulder, unspecified osteoarthritis type   THERAPY DIAG:  Chronic left shoulder pain  Shoulder joint stiffness, left  Muscle weakness (generalized)  Other low back pain  Neck pain  Rationale for Evaluation and Treatment: Rehabilitation  ONSET DATE: Chronic  SUBJECTIVE:  SUBJECTIVE STATEMENT: Pt. Known to PT clinic.  Pt. Referred to PT with c/o L shoulder pain/limitations and chronic low back/ neck pain/ headaches.  Pts.  Primary goal to is to decrease pain symptoms.  Pt. States her migraines and dizziness limits ability to work.  Pt. States she was pulling on washing machine and injured L shoulder.  Pt. Had cortisone injection with no benefit and PT in [place] for strengthening with marked increase in pain.  See MRI results.   Hand dominance: Right  PERTINENT HISTORY: See MD notes.    PAIN:  Are you having pain? Yes: NPRS scale: 5/10 L shoulder, 6/10 low back  Pain location: sh./neck/back Pain description: persistent/ sharp pain in L shoulder with mvmt.   Aggravating factors: movement Relieving factors: rest  PRECAUTIONS: None  RED FLAGS: None   WEIGHT BEARING RESTRICTIONS: No  FALLS:  Has patient fallen in last 6 months? No  LIVING ENVIRONMENT: Lives with: lives alone Lives in: House/apartment Has following equipment at home: None  OCCUPATION: Real Estate  PLOF: Independent  PATIENT GOALS: Increase L shoulder ROM/ strength.  Decrease pain  NEXT MD VISIT:   OBJECTIVE:  Note: Objective measures were completed at Evaluation unless otherwise noted.  DIAGNOSTIC FINDINGS:  FINDINGS: Rotator cuff: Mild supraspinatus tendinosis. Infraspinatus tendon is intact. Teres minor tendon is intact. Subscapularis tendon is intact.   Muscles: No muscle atrophy or edema. No intramuscular fluid collection or hematoma.   Biceps Long Head: Intraarticular and extraarticular portions of the biceps tendon are intact.   Acromioclavicular Joint: No significant arthropathy of the acromioclavicular joint. No subacromial/subdeltoid bursal fluid.   Glenohumeral Joint: No joint effusion. Mild partial-thickness cartilage loss of the glenohumeral joint.   Labrum: Grossly intact, but evaluation is limited by lack of intraarticular fluid/contrast.   Bones: No fracture or dislocation. No aggressive osseous lesion.   Other: No fluid collection or hematoma.   IMPRESSION: 1. Mild supraspinatus  tendinosis. 2. Mild partial-thickness cartilage loss of the glenohumeral joint.     Electronically Signed   By: Onnie Bilis M.D.   On: 04/15/2023 13:30  EXAM: MRI CERVICAL SPINE WITHOUT CONTRAST   TECHNIQUE: Multiplanar, multisequence MR imaging of the cervical spine was performed. No intravenous contrast was administered.   COMPARISON:  Cervical spine radiographs 08/19/2020   FINDINGS: Alignment: Physiologic.   Vertebrae: No acute or suspicious osseous findings.   Cord: Normal in signal and caliber.   Posterior Fossa, vertebral arteries, paraspinal tissues: Visualized portions of the posterior fossa appear unremarkable.Bilateral vertebral artery flow voids. No significant paraspinal findings.   Disc levels:   C2-3: Normal interspace.   C3-4: Mild uncinate spurring on the left. No spinal stenosis or significant foraminal narrowing.   C4-5: Spondylosis with loss of disc height and posterior osteophytes covering diffusely bulging disc material. Mild asymmetric uncinate spurring on the right. No central spinal stenosis. Mild-to-moderate right and mild left foraminal narrowing.   C5-6: Spondylosis with loss of disc height and posterior osteophytes covering diffusely bulging disc material. Mild uncinate spurring bilaterally. No significant central spinal stenosis. Mild to moderate foraminal narrowing bilaterally, greater on the right.   C6-7: Normal interspace.   C7-T1: Normal interspace.   IMPRESSION: 1. Cervical spondylosis with disc bulging and uncinate spurring at C4-5 and C5-6. No significant central spinal stenosis. 2. Mild-to-moderate foraminal narrowing bilaterally at C5-6, greater on the right. 3. Mild-to-moderate right and mild left foraminal narrowing at C4-5. 4. No acute findings.     Electronically Signed   By: Sammie Crigler  Redge Cancel M.D.   On: 04/15/2023 14:31  PATIENT SURVEYS:  Modified Oswestry 32% self-perceived moderate disability  and Quick Dash  43.2%  COGNITION: Overall cognitive status: Within functional limits for tasks assessed     SENSATION: WFL  POSTURE: Rounded shoulder/ slight forward head posture in sitting  UPPER EXTREMITY ROM:   Active ROM Right AROM eval Left PROM eval  Shoulder flexion 158 deg. 111 deg. (Pain)- UT compensation  Shoulder extension    Shoulder abduction 155 deg. 74 deg. (Pain)  Shoulder adduction    Shoulder internal rotation 85 deg. 17 deg. (Pain)  Shoulder external rotation 85 deg. 71 deg.  Elbow flexion WNL WNL  Elbow extension WNL WNL  Wrist flexion    Wrist extension    Wrist ulnar deviation    Wrist radial deviation    Wrist pronation    Wrist supination    (Blank rows = not tested)  Cervical AROM: flexion 54 deg./ extension 36 deg./ L rotn. 48 deg./ R rotn. 48 deg./ L lat. Flex. 39 deg./ R lat. Flexion 31 deg. (L UT tightness)  Lumbar AROM: standing flexion 90 deg. (Straight lumbar spine).    UPPER EXTREMITY MMT:  MMT Right eval Left eval  Shoulder flexion 4 NT (pain)  Shoulder extension 4+   Shoulder abduction 4   Shoulder adduction    Shoulder internal rotation 4+   Shoulder external rotation 4   Middle trapezius    Lower trapezius    Elbow flexion 5 4+  Elbow extension 5 4+  Wrist flexion    Wrist extension    Wrist ulnar deviation    Wrist radial deviation    Wrist pronation    Wrist supination    Grip strength (lbs)    (Blank rows = not tested)  SHOULDER SPECIAL TESTS: NT due to L shoulder pain/ limitations.   JOINT MOBILITY TESTING:  L shoulder joint hypomobility as compared to R shoulder  PALPATION:  (+) tenderness in L shoulder/ deltoid musculature  4/9: L shoulder AA/PROM: flexion 114 deg./ abduction 114 deg. (Pain limited)   4/16:  reassessment of L shoulder AROM in supine (flexion 110 deg./ abduction 74 deg./ ER 27 deg./ IR 55 deg.).   Wall ladder for L shoulder forward flexion 5x (131 deg.)/abduction 5x (124 deg.).  Increase ROM today as  compared to last tx.    Standing L shoulder AROM: flexion 132 deg./ abduction 128 deg.  Improvement noted from last tx.                                                                                                                            TREATMENT DATE: 07/26/2023   Subjective:  Pt. Reports no pain today.   Pt. Has remained active with daily tasks.    Manual tx.:  Supine L shoulder AA/PROM (all planes)- 5x each.  Pain limited/ guarded.    Seated/ supine STM to L shoulder/ UT musculature during L shoulder ROM ex.  Supine cervical stretches: L/R UT, levator, rotn. stretches with static holds 3x each.    Supine L shoulder horizontal abduction/ adduction 5x with holds.    Supine L shoulder AROM: (flexion 129 deg./ abduction 131 deg./ ER 41 deg./ IR 72 deg.).   There.ex.:   Seated/ supine L shoulder A/AROM (all planes).  Flexion (136 deg.).  Supine 1# chest press/ serratus punches/ tricep extension 20x each.    Seated scap. retraction with no resistance today (isometric holds).  Good upright posture.  Discussed HEP   NOT TODAY Standing row with RTB 2 x 10    Standing shoulder extension RTB 2 x 10  Standing shoulder ER ("W") RTB 2 x 10    Seated UBE:  2 min. F/b.  Consistent cadence/  good upright posture/ head position.    Standing wand ex.: sh. Flexion/ chest press/ extension/ IR 20x each.    Standing GTB shoulder ex.: scap. Retraction/ sh. Extension/ horizontal flexion/ IR/ ER 20x each.    Discussed L shoulder isometrics flexion/abduction/IR/ER x 10 with 3 second hold each direction - reviewed HEP   PATIENT EDUCATION: Education details: See HEP Person educated: Patient Education method: Explanation, Demonstration, and Handouts Education comprehension: verbalized understanding and returned demonstration  HOME EXERCISE PROGRAM: Access Code: [email or web address] URL: [email or web address] Date: 05/30/2023 Prepared by: Hazeline Lister  Exercises - Circular  Shoulder Pendulum with Table Support  - 2 x daily - 7 x weekly - 2 sets - 10 reps - Seated Shoulder Flexion AAROM with Pulley Behind  - 2 x daily - 7 x weekly - 2 sets - 10 reps - Seated Shoulder Scaption AAROM with Pulley at Side  - 2 x daily - 7 x weekly - 2 sets - 10 reps - Seated Shoulder Abduction AAROM with Pulley Behind  - 2 x daily - 7 x weekly - 2 sets - 10 reps  Access Code: [email or web address] URL: [email or web address] Date: 06/01/2023 Prepared by: Hazeline Lister  Exercises - Supine Lower Trunk Rotation  - 1 x daily - 5 x weekly - 1 sets - 10 reps - Supine Figure 4 Piriformis Stretch  - 1 x daily - 5 x weekly - 1 sets - 10 reps - Supine Posterior Pelvic Tilt  - 1 x daily - 5 x weekly - 1 sets - 10 reps - Supine March  - 1 x daily - 5 x weekly - 1 sets - 10 reps - Supine Shoulder Press AAROM in Abduction with Dowel  - 1 x daily - 5 x weekly - 1 sets - 10 reps - Supine Shoulder Flexion AAROM with Dowel  - 1 x daily - 5 x weekly - 1 sets - 10 reps - Single Arm Doorway Pec Stretch at 60 Elevation  - 1 x daily - 5 x weekly - 1 sets - 5 reps  Access Code: [email or web address] URL: [email or web address] Date: 06/15/2023 Prepared by: Janine Melbourne  Exercises - Standing Isometric Shoulder Flexion with Doorway - Arm Bent  - 1 x daily - 5 x weekly - 2 sets - 10 reps - Standing Isometric Shoulder Abduction with Doorway - Arm Bent  - 1 x daily - 5 x weekly - 2 sets - 10 reps - Standing Isometric Shoulder Internal Rotation at Doorway  - 1 x daily - 5 x weekly - 2 sets - 10 reps - Standing Isometric Shoulder External Rotation with Doorway  - 1 x daily - 5 x weekly - 2 sets - 10 reps  ASSESSMENT:  CLINICAL IMPRESSION:  Pt. demonstrates slow but consistent progress with L shoulder ROM ex.  Pt. Remains pain limited in L shoulder/ ribs/ mid. Back during all ex.  No resisted ex. Today with focus on L shoulder joint mobility/ ROM.   (+) L shoulder impingement with L shoulder horizontal  adduction/ abduction.  See updated goals.  Pt. Will benefit from skilled PT services to increase L shoulder ROM/ strength to improve pain-free mobility.     OBJECTIVE IMPAIRMENTS: decreased activity tolerance, decreased mobility, decreased ROM, decreased strength, hypomobility, impaired flexibility, impaired UE functional use, improper body mechanics, postural dysfunction, and pain.   ACTIVITY LIMITATIONS: carrying, lifting, standing, reach over head, and locomotion level  PARTICIPATION LIMITATIONS: cleaning, laundry, community activity, and occupation  PERSONAL FACTORS: Fitness and Past/current experiences are also affecting patient's functional outcome.   REHAB POTENTIAL: Good  CLINICAL DECISION MAKING: Evolving/moderate complexity  EVALUATION COMPLEXITY: Moderate   GOALS: Goals reviewed with patient? Yes  LONG TERM GOALS: Target date: 08/23/23  Pt. Will decrease QuickDASH to <20% to improve pain-free UE mobility/ overhead reaching tasks.  Baseline: 43.2%.  5/29: 36.4% improvement Goal status: Partially met  2.  Pt. Will decrease MODI to <20% to improve pain-free functional mobility with daily tasks/ body mechanics.   Baseline: 32% self-perceived moderate disability.  5/29: 44% self-perceived severe disability.  Regression secondary in back pain over past couple weeks.   Goal status: Not met  3.  Pt. Will report no neck pain/ headaches consistently for a week to improve ability to work.   Baseline: chronic h/o neck pain/ HA Goal status: Not met  PLAN:  PT FREQUENCY: 1-2x/week  PT DURATION: 4 weeks  PLANNED INTERVENTIONS: 97110-Therapeutic exercises, 97530- Therapeutic activity, [id number]- Neuromuscular re-education, 97535- Self Care, 16109- Manual therapy, [id number]- Electrical stimulation (unattended), Patient/Family education, Joint mobilization, Cryotherapy, and Moist heat  PLAN FOR NEXT SESSION:  Issue a resisted ex.    Lendell Quarry, PT, DPT # [phone] Physical Therapist -  [facility] 07/26/2023, 12:31 PM

## 2023-08-02 ENCOUNTER — Encounter: Payer: Self-pay | Admitting: Physical Therapy

## 2023-09-28 ENCOUNTER — Encounter: Payer: Self-pay | Admitting: Emergency Medicine

## 2023-09-28 ENCOUNTER — Emergency Department
Admission: EM | Admit: 2023-09-28 | Discharge: 2023-09-28 | Disposition: A | Discharge status: Home / Self Care | Payer: PRIVATE HEALTH INSURANCE | Source: Ambulatory Visit | Attending: Emergency Medicine | Admitting: Emergency Medicine

## 2023-09-28 ENCOUNTER — Other Ambulatory Visit: Payer: Self-pay

## 2023-09-28 ENCOUNTER — Emergency Department: Payer: PRIVATE HEALTH INSURANCE

## 2023-09-28 DIAGNOSIS — R0602 Shortness of breath: Secondary | ICD-10-CM

## 2023-09-28 DIAGNOSIS — R091 Pleurisy: Secondary | ICD-10-CM | POA: Insufficient documentation

## 2023-09-28 DIAGNOSIS — R079 Chest pain, unspecified: Secondary | ICD-10-CM

## 2023-09-28 DIAGNOSIS — R0789 Other chest pain: Secondary | ICD-10-CM | POA: Diagnosis present

## 2023-09-28 LAB — CBC
HCT: 41.1 % (ref 36.0–46.0)
Hemoglobin: 12.7 g/dL (ref 12.0–15.0)
MCH: 25.6 pg — ABNORMAL LOW (ref 26.0–34.0)
MCHC: 30.9 g/dL (ref 30.0–36.0)
MCV: 82.7 fL (ref 80.0–100.0)
Platelets: 270 K/uL (ref 150–400)
RBC: 4.97 MIL/uL (ref 3.87–5.11)
RDW: 13.9 % (ref 11.5–15.5)
WBC: 3.1 K/uL — ABNORMAL LOW (ref 4.0–10.5)
nRBC: 0 % (ref 0.0–0.2)

## 2023-09-28 LAB — BASIC METABOLIC PANEL WITH GFR
Anion gap: 9 (ref 5–15)
BUN: 9 mg/dL (ref 6–20)
CO2: 25 mmol/L (ref 22–32)
Calcium: 8.9 mg/dL (ref 8.9–10.3)
Chloride: 105 mmol/L (ref 98–111)
Creatinine, Ser: 0.79 mg/dL (ref 0.44–1.00)
GFR, Estimated: >60 mL/min (ref >60)
Glucose, Bld: 96 mg/dL (ref 70–99)
Potassium: 3.8 mmol/L (ref 3.5–5.1)
Sodium: 139 mmol/L (ref 135–145)

## 2023-09-28 LAB — TROPONIN I (HIGH SENSITIVITY): Troponin I (High Sensitivity): 3 ng/L (ref <18)

## 2023-09-28 LAB — D-DIMER, QUANTITATIVE: D-Dimer, Quant: 0.78 ug{FEU}/mL — ABNORMAL HIGH (ref 0.00–0.50)

## 2023-09-28 MED ORDER — IOHEXOL 350 MG/ML SOLN
75.0000 mL | Freq: Once | INTRAVENOUS | Status: AC | PRN
  Administered 2023-09-28: 75 mL via INTRAVENOUS

## 2023-09-28 MED ORDER — KETOROLAC TROMETHAMINE 30 MG/ML IJ SOLN
30.0000 mg | Freq: Once | INTRAMUSCULAR | Status: AC
  Administered 2023-09-28: 30 mg via INTRAVENOUS
  Filled 2023-09-28: qty 1

## 2023-09-28 MED ORDER — DIPHENHYDRAMINE HCL 50 MG/ML IJ SOLN
25.0000 mg | Freq: Once | INTRAMUSCULAR | Status: AC
  Administered 2023-09-28: 25 mg via INTRAVENOUS
  Filled 2023-09-28: qty 1

## 2023-09-28 MED ORDER — DIPHENHYDRAMINE HCL 50 MG/ML IJ SOLN: 50.0000 mg | Freq: Once | INTRAMUSCULAR | Status: DC

## 2023-09-28 MED ORDER — DIPHENHYDRAMINE HCL 25 MG PO CAPS: 50.0000 mg | ORAL_CAPSULE | Freq: Once | ORAL | Status: DC

## 2023-09-28 MED ORDER — METHYLPREDNISOLONE SODIUM SUCC 40 MG IJ SOLR: 40.0000 mg | Freq: Once | INTRAMUSCULAR | Status: DC

## 2023-09-28 NOTE — ED Notes (Addendum)
 Delay in CTA and medications due to patient request US /IV team to insert IV.  Waiting on IV team to put in IV.

## 2023-09-28 NOTE — ED Notes (Signed)
 Patient stating that previous EDP did not communicate with her about medications and CTA scan that will require IV. Patient refused IV placement at this time. Bradler, EDP notified and in room speaking with patient.

## 2023-09-28 NOTE — ED Notes (Signed)
 Medications ordered to be given when IV established.

## 2023-09-28 NOTE — ED Provider Notes (Signed)
 [facility] Provider Note    Event Date/Time   First MD Initiated Contact with Patient 09/28/23 1237     (approximate)   History   Chest Pain   HPI  Robin Schwartz is a 53 y.o. female who presents with left-sided chest pain which she describes as pleuritic.  The patient was sent from urgent care for evaluation, at that time she was reporting dizziness, lightheadedness.  Has apparently had cold symptoms as well which have resolved  Describes pressure-like discomfort to left anterior superior chest, worse with deep inspiration.  No recent travel, no calf pain or swelling, no history of DVT.  She reports cough is mostly resolved     Physical Exam   Triage Vital Signs: ED Triage Vitals  Encounter Vitals Group     BP 09/28/23 1138 126/79     Girls Systolic BP Percentile --      Girls Diastolic BP Percentile --      Boys Systolic BP Percentile --      Boys Diastolic BP Percentile --      Pulse Rate 09/28/23 1138 76     Resp 09/28/23 1138 18     Temp 09/28/23 1138 98.5 F (36.9 C)     Temp Source 09/28/23 1138 Oral     SpO2 09/28/23 1138 100 %     Weight 09/28/23 1136 59.4 kg (131 lb)     Height 09/28/23 1136 1.638 m (5' 4.5)     Head Circumference --      Peak Flow --      Pain Score 09/28/23 1136 7     Pain Loc --      Pain Education --      Exclude from Growth Chart --     Most recent vital signs: Vitals:   09/28/23 1138 09/28/23 1430  BP: 126/79 124/66  Pulse: 76 63  Resp: 18 11  Temp: 98.5 F (36.9 C)   SpO2: 100% 100%     General: Awake, no distress.  CV:  Good peripheral perfusion.  Resp:  Normal effort.  Clear to auscultation bilaterally Abd:  No distention.  Other:  No calf pain or swelling   ED Results / Procedures / Treatments   Labs (all labs ordered are listed, but only abnormal results are displayed) Labs Reviewed  CBC - Abnormal; Notable for the following components:      Result Value   WBC 3.1 (*)    MCH  25.6 (*)    All other components within normal limits  BASIC METABOLIC PANEL WITH GFR  D-DIMER, QUANTITATIVE  TROPONIN I (HIGH SENSITIVITY)     EKG  ED ECG REPORT I, Lamar Price, the attending physician, personally viewed and interpreted this ECG.  Date: 09/28/2023  Rhythm: normal sinus rhythm QRS Axis: normal Intervals: normal ST/T Wave abnormalities: normal Narrative Interpretation: no evidence of acute ischemia    RADIOLOGY Chest x-ray viewed interpreted by me, no acute abnormality    PROCEDURES:  Critical Care performed:   Procedures   MEDICATIONS ORDERED IN ED: Medications - No data to display   IMPRESSION / MDM / ASSESSMENT AND PLAN / ED COURSE  I reviewed the triage vital signs and the nursing notes. Patient's presentation is most consistent with acute presentation with potential threat to life or bodily function.  Patient presents with chest discomfort as detailed above, differential includes pneumonia, pleurisy related to recent cold, PE, less likely pneumothorax  Lab work obtained and is overall reassuring,  high sensitive troponin is normal.  Chest x-ray without pneumonia or pneumothorax  We discussed evaluation for PE, she is concerned about pulmonary embolism I discussed with her that her risk is low and via shared decision making we opted to obtain D-dimer.  She does have a contrast allergy although she thinks this is not real and would need to have four hour pretreatment if D-dimer is elevated and she agrees to this plan  I have asked my colleague to follow-up on D-dimer        FINAL CLINICAL IMPRESSION(S) / ED DIAGNOSES   Final diagnoses:  Pleurisy     Rx / DC Orders   ED Discharge Orders     None        Note:  This document was prepared using Dragon voice recognition software and may include unintentional dictation errors.   Arlander Charleston, MD 09/28/23 1500

## 2023-09-28 NOTE — ED Triage Notes (Signed)
 Patient to ED from [facility] for CP, SOB w/ exertion, dizziness and light headed. Reports cold symptoms since the weekend- productive cough. Denies cardiac hx.

## 2023-09-28 NOTE — ED Notes (Signed)
 Medications given CT notified.

## 2023-09-28 NOTE — ED Notes (Signed)
Lab in room now

## 2023-09-28 NOTE — ED Notes (Signed)
 Lab collected D-dimer.

## 2023-09-28 NOTE — ED Provider Notes (Signed)
 Emergency department handoff note  Care of this patient was signed out to me at the end of the previous provider shift.  All pertinent patient information was conveyed and all questions were answered.  Patient pending D-dimer that has come back at 0.75.  Patient will require a CT angiography of the chest to look for possible pulmonary emboli.  Patient does have a listed allergy to contrast however in discussion with the patient, she has had multiple CT scans with iodinated contrast that have not caused any reaction.  Patient states that she did have 1 occasion in which she felt itching to the back of her throat but denies any shortness of breath, difficulty swallowing, or sensation of swelling/shortness of breath.  Patient requested 25 mg of Benadryl  prior to her scan that she says she has had before and has never had any further reactions to IV contrast during a CT.  In discussion with CT tech, Left[place], she is unable to perform a CT with contrast unless this allergy is removed from patient's allergy list.  This allergy has been removed for now due to being not clinically significant. CT angiography has not showed any evidence of acute abnormalities.  These results were followed up with patient The patient has been reexamined and is ready to be discharged.  All diagnostic results have been reviewed and discussed with the patient/family.  Care plan has been outlined and the patient/family understands all current diagnoses, results, and treatment plans.  There are no new complaints, changes, or physical findings at this time.  All questions have been addressed and answered.  Patient was instructed to, and agrees to follow-up with their primary care physician as well as return to the emergency department if any new or worsening symptoms develop.   Jossie Artist POUR, MD 09/28/23 [phone]

## 2023-09-28 NOTE — ED Notes (Signed)
 Attempted 2x and unable to obtain D-dimer from patient. Called lab to come collect.

## 2023-11-14 ENCOUNTER — Other Ambulatory Visit: Payer: Self-pay | Admitting: Family Medicine

## 2023-11-14 DIAGNOSIS — Z1231 Encounter for screening mammogram for malignant neoplasm of breast: Secondary | ICD-10-CM

## 2024-02-27 ENCOUNTER — Ambulatory Visit: Payer: PRIVATE HEALTH INSURANCE | Admitting: Physical Therapy

## 2024-02-29 ENCOUNTER — Ambulatory Visit: Payer: PRIVATE HEALTH INSURANCE | Admitting: Physical Therapy

## 2024-03-13 ENCOUNTER — Ambulatory Visit: Payer: PRIVATE HEALTH INSURANCE | Admitting: Physical Therapy

## 2024-03-14 ENCOUNTER — Telehealth: Payer: Self-pay

## 2024-03-14 NOTE — Telephone Encounter (Signed)
 Telephoned patient at mobile number. Patient requested mammogram scholarship application. Confirmed patient's address and included medical release form. [facility] (scholarship)

## 2024-03-15 ENCOUNTER — Ambulatory Visit: Payer: PRIVATE HEALTH INSURANCE | Admitting: Physical Therapy

## 2024-03-15 ENCOUNTER — Ambulatory Visit: Payer: PRIVATE HEALTH INSURANCE

## 2024-03-15 ENCOUNTER — Encounter: Payer: Self-pay | Admitting: Oncology

## 2024-03-21 ENCOUNTER — Ambulatory Visit: Payer: Self-pay | Attending: Orthopedic Surgery | Admitting: Physical Therapy

## 2024-03-21 ENCOUNTER — Ambulatory Visit: Payer: PRIVATE HEALTH INSURANCE | Admitting: Physical Therapy

## 2024-03-21 DIAGNOSIS — M5459 Other low back pain: Secondary | ICD-10-CM | POA: Diagnosis present

## 2024-03-21 DIAGNOSIS — M25512 Pain in left shoulder: Secondary | ICD-10-CM | POA: Diagnosis present

## 2024-03-21 DIAGNOSIS — M542 Cervicalgia: Secondary | ICD-10-CM | POA: Diagnosis present

## 2024-03-21 DIAGNOSIS — M25612 Stiffness of left shoulder, not elsewhere classified: Secondary | ICD-10-CM | POA: Diagnosis present

## 2024-03-21 DIAGNOSIS — M6281 Muscle weakness (generalized): Secondary | ICD-10-CM | POA: Diagnosis present

## 2024-03-21 DIAGNOSIS — G8929 Other chronic pain: Secondary | ICD-10-CM | POA: Insufficient documentation

## 2024-03-23 ENCOUNTER — Ambulatory Visit: Payer: PRIVATE HEALTH INSURANCE | Admitting: Physical Therapy

## 2024-03-24 NOTE — Therapy (Signed)
" °  OUTPATIENT PHYSICAL THERAPY SHOULDER EVALUATION   Patient Name: Shyne Resch MRN: [id number] DOB:[birth year], 53 y.o., female Today's Date: 03/21/2024  END OF SESSION:  PT End of Session - 03/24/24 1851     Visit Number 1    Number of Visits 8    Date for Recertification  04/18/24    PT Start Time 0822    PT Stop Time 0912    PT Time Calculation (min) 50 min          Past Medical History:  Diagnosis Date   Allergy    Anemia    Anxiety    Arthritis    Emphysema of lung (HCC)    Migraine    Neuromuscular disorder [facility])    Past Surgical History:  Procedure Laterality Date   BREAST EXCISIONAL BIOPSY Left 1992   BREAST SURGERY  10/23/1989   Remove benign cyst   CYSTOSCOPY  2003   LIPOMA EXCISION Left 2007   shoulder   Patient Active Problem List   Diagnosis Date Noted   Hematochezia 06/08/2023   Biceps tendinitis, left [M75.22] 03/15/2023   Prolonged menstruation 03/09/2023   Trigger thumb, right thumb 02/17/2023   Lightheadedness 02/11/2023   Rotator cuff impingement syndrome of left shoulder 09/10/2022   Greater trochanteric pain syndrome of left lower extremity 09/10/2022   Neuropathy 04/12/2022   Angioedema 04/12/2022   Migraine headache 04/12/2022   Numbness and tingling of both lower extremities 07/17/2021   Postural dizziness with presyncope 07/17/2021   Weakness of both lower extremities 05/15/2021   New daily persistent headache 05/15/2021   Trigeminal neuralgia 03/11/2021   Nocturnal foot cramps 02/04/2021   Panic disorder 10/20/2020   BMI 20.0-20.9, adult 10/02/2020   Chronic fatigue 10/02/2020   History of COVID-19 09/11/2020   Bilateral primary osteoarthritis of knee 09/09/2020   Weight loss, unintentional 09/09/2020   Cervical spondylosis 09/01/2020   Anemia 08/19/2020   Cervical paraspinous muscle spasm 08/19/2020   Thickened endometrium 08/14/2020   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   Iron deficiency anemia 05/02/2020   Heart  palpitations 03/06/2020   Hypersomnia 06/11/2019   Seasonal allergies 06/07/2019   Dysmenorrhea 02/21/2019   MEE (middle ear effusion), bilateral 02/21/2019   Cervical stenosis of spine 06/08/2018   Hand joint pain 06/08/2018   Positive antinuclear antibody 06/08/2018   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   Chronic abdominal pain 10/10/2014   Headache disorder 12/12/2013   Patellofemoral dysfunction 11/15/2013   Stress incontinence 08/10/2013   Thoracolumbar back pain 08/09/2013   Bursitis of both hips 08/09/2013   Other bursitis of hip, unspecified hip 08/09/2013   Personal history of other endocrine, nutritional and metabolic disease 94/97/7984   Iliotibial band syndrome 05/31/2013   Irritable bowel syndrome with constipation 04/27/2013   Gastroesophageal reflux disease 04/04/2013   Eustachian tube dysfunction 11/29/2012   Lesion of sciatic nerve 09/27/2012   Piriformis syndrome 09/27/2012   Generalized anxiety disorder 03/28/2012   Skin sensation disturbance 06/01/2011   Disequilibrium 06/01/2011    Ileene Ozell BROCKS, PT 03/24/2024, 6:52 PM  "

## 2024-03-27 ENCOUNTER — Ambulatory Visit: Payer: PRIVATE HEALTH INSURANCE | Admitting: Physical Therapy

## 2024-03-28 ENCOUNTER — Ambulatory Visit: Payer: PRIVATE HEALTH INSURANCE | Admitting: Physical Therapy

## 2024-03-29 ENCOUNTER — Telehealth: Payer: Self-pay

## 2024-03-29 ENCOUNTER — Encounter: Payer: Self-pay | Admitting: Oncology

## 2024-03-29 NOTE — Telephone Encounter (Signed)
 Patient telephoned and states she hasn't received the mammogram scholarship application mailed to her. Confirmed patient's address and will mail a second application to the patient. [facility]

## 2024-03-30 ENCOUNTER — Encounter: Payer: Self-pay | Admitting: Oncology

## 2024-03-30 ENCOUNTER — Ambulatory Visit: Payer: PRIVATE HEALTH INSURANCE | Admitting: Physical Therapy

## 2024-03-30 ENCOUNTER — Ambulatory Visit: Payer: Self-pay | Admitting: Physical Therapy

## 2024-04-02 ENCOUNTER — Ambulatory Visit: Payer: Self-pay | Admitting: Physical Therapy

## 2024-04-04 ENCOUNTER — Ambulatory Visit: Payer: PRIVATE HEALTH INSURANCE | Admitting: Physical Therapy

## 2024-04-05 ENCOUNTER — Ambulatory Visit: Payer: Self-pay | Admitting: Physical Therapy

## 2024-04-06 ENCOUNTER — Ambulatory Visit: Payer: PRIVATE HEALTH INSURANCE

## 2024-04-11 ENCOUNTER — Ambulatory Visit: Payer: PRIVATE HEALTH INSURANCE

## 2024-04-12 ENCOUNTER — Ambulatory Visit: Payer: Self-pay | Admitting: Physical Therapy

## 2024-04-13 ENCOUNTER — Ambulatory Visit: Payer: PRIVATE HEALTH INSURANCE

## 2024-04-18 ENCOUNTER — Ambulatory Visit: Payer: PRIVATE HEALTH INSURANCE

## 2024-04-20 ENCOUNTER — Ambulatory Visit: Payer: PRIVATE HEALTH INSURANCE

## 2024-04-25 ENCOUNTER — Ambulatory Visit: Payer: PRIVATE HEALTH INSURANCE

## 2024-04-27 ENCOUNTER — Ambulatory Visit: Payer: PRIVATE HEALTH INSURANCE | Admitting: Physical Therapy
